# Patient Record
Sex: Male | Born: 1980 | Hispanic: No | Marital: Single | Smoking: Current every day smoker
Health system: Southern US, Community
[De-identification: ages and names within clinical notes are randomized; demographics above are authoritative.]

## PROBLEM LIST (undated history)

## (undated) DIAGNOSIS — F419 Anxiety disorder, unspecified: Secondary | ICD-10-CM

## (undated) DIAGNOSIS — R519 Headache, unspecified: Secondary | ICD-10-CM

## (undated) DIAGNOSIS — Z8659 Personal history of other mental and behavioral disorders: Secondary | ICD-10-CM

## (undated) DIAGNOSIS — R51 Headache: Secondary | ICD-10-CM

## (undated) DIAGNOSIS — F209 Schizophrenia, unspecified: Secondary | ICD-10-CM

## (undated) HISTORY — PX: APPENDECTOMY: SHX54

## (undated) HISTORY — PX: TIBIA FRACTURE SURGERY: SHX806

---

## 1998-09-09 ENCOUNTER — Encounter: Payer: Self-pay | Admitting: Emergency Medicine

## 1998-09-09 ENCOUNTER — Emergency Department (HOSPITAL_COMMUNITY): Admission: EM | Admit: 1998-09-09 | Discharge: 1998-09-09 | Payer: Self-pay | Admitting: Emergency Medicine

## 2001-05-16 ENCOUNTER — Emergency Department (HOSPITAL_COMMUNITY): Admission: EM | Admit: 2001-05-16 | Discharge: 2001-05-16 | Payer: Self-pay | Admitting: Emergency Medicine

## 2002-01-11 ENCOUNTER — Encounter: Admission: RE | Admit: 2002-01-11 | Discharge: 2002-01-11 | Payer: Self-pay | Admitting: *Deleted

## 2002-01-25 ENCOUNTER — Encounter: Admission: RE | Admit: 2002-01-25 | Discharge: 2002-01-25 | Payer: Self-pay | Admitting: *Deleted

## 2002-04-12 ENCOUNTER — Encounter: Admission: RE | Admit: 2002-04-12 | Discharge: 2002-04-12 | Payer: Self-pay | Admitting: *Deleted

## 2002-10-03 ENCOUNTER — Emergency Department (HOSPITAL_COMMUNITY): Admission: EM | Admit: 2002-10-03 | Discharge: 2002-10-03 | Payer: Self-pay | Admitting: *Deleted

## 2002-10-05 ENCOUNTER — Encounter: Payer: Self-pay | Admitting: Family Medicine

## 2002-10-05 ENCOUNTER — Inpatient Hospital Stay (HOSPITAL_COMMUNITY): Admission: EM | Admit: 2002-10-05 | Discharge: 2002-10-07 | Payer: Self-pay | Admitting: Emergency Medicine

## 2002-10-06 ENCOUNTER — Encounter (INDEPENDENT_AMBULATORY_CARE_PROVIDER_SITE_OTHER): Payer: Self-pay | Admitting: Specialist

## 2002-10-12 ENCOUNTER — Ambulatory Visit (HOSPITAL_COMMUNITY): Admission: RE | Admit: 2002-10-12 | Discharge: 2002-10-12 | Payer: Self-pay | Admitting: Gastroenterology

## 2004-07-15 ENCOUNTER — Ambulatory Visit: Payer: Self-pay | Admitting: Internal Medicine

## 2005-07-20 ENCOUNTER — Ambulatory Visit: Payer: Self-pay | Admitting: Family Medicine

## 2005-08-27 ENCOUNTER — Ambulatory Visit (HOSPITAL_BASED_OUTPATIENT_CLINIC_OR_DEPARTMENT_OTHER): Admission: RE | Admit: 2005-08-27 | Discharge: 2005-08-27 | Payer: Self-pay | Admitting: Urology

## 2005-08-27 ENCOUNTER — Ambulatory Visit: Payer: Self-pay | Admitting: *Deleted

## 2005-08-27 ENCOUNTER — Encounter (INDEPENDENT_AMBULATORY_CARE_PROVIDER_SITE_OTHER): Payer: Self-pay | Admitting: Specialist

## 2007-02-10 ENCOUNTER — Emergency Department (HOSPITAL_COMMUNITY): Admission: EM | Admit: 2007-02-10 | Discharge: 2007-02-11 | Payer: Self-pay | Admitting: Emergency Medicine

## 2007-03-16 ENCOUNTER — Ambulatory Visit: Payer: Self-pay | Admitting: Internal Medicine

## 2007-04-04 ENCOUNTER — Ambulatory Visit: Payer: Self-pay | Admitting: Psychiatry

## 2007-04-04 ENCOUNTER — Inpatient Hospital Stay (HOSPITAL_COMMUNITY): Admission: AD | Admit: 2007-04-04 | Discharge: 2007-04-14 | Payer: Self-pay | Admitting: Psychiatry

## 2007-07-05 ENCOUNTER — Inpatient Hospital Stay (HOSPITAL_COMMUNITY): Admission: EM | Admit: 2007-07-05 | Discharge: 2007-07-06 | Payer: Self-pay | Admitting: Emergency Medicine

## 2007-07-06 ENCOUNTER — Ambulatory Visit: Payer: Self-pay | Admitting: Psychiatry

## 2008-08-23 ENCOUNTER — Emergency Department (HOSPITAL_COMMUNITY): Admission: EM | Admit: 2008-08-23 | Discharge: 2008-08-23 | Payer: Self-pay | Admitting: Emergency Medicine

## 2008-09-07 ENCOUNTER — Ambulatory Visit: Payer: Self-pay | Admitting: Internal Medicine

## 2008-11-25 ENCOUNTER — Emergency Department (HOSPITAL_BASED_OUTPATIENT_CLINIC_OR_DEPARTMENT_OTHER): Admission: EM | Admit: 2008-11-25 | Discharge: 2008-11-25 | Payer: Self-pay | Admitting: Emergency Medicine

## 2008-11-25 ENCOUNTER — Ambulatory Visit: Payer: Self-pay | Admitting: Diagnostic Radiology

## 2008-11-27 ENCOUNTER — Other Ambulatory Visit: Payer: Self-pay | Admitting: Emergency Medicine

## 2008-11-27 ENCOUNTER — Inpatient Hospital Stay (HOSPITAL_COMMUNITY): Admission: EM | Admit: 2008-11-27 | Discharge: 2008-11-30 | Payer: Self-pay | Admitting: Emergency Medicine

## 2008-11-27 ENCOUNTER — Ambulatory Visit: Payer: Self-pay | Admitting: Infectious Disease

## 2008-11-27 ENCOUNTER — Encounter (INDEPENDENT_AMBULATORY_CARE_PROVIDER_SITE_OTHER): Payer: Self-pay | Admitting: Surgery

## 2010-05-28 ENCOUNTER — Emergency Department (HOSPITAL_COMMUNITY): Payer: Self-pay

## 2010-05-28 ENCOUNTER — Inpatient Hospital Stay (HOSPITAL_COMMUNITY)
Admission: EM | Admit: 2010-05-28 | Discharge: 2010-06-03 | DRG: 488 | Disposition: A | Discharge status: Home / Self Care | Payer: Self-pay | Attending: General Surgery | Admitting: General Surgery

## 2010-05-28 DIAGNOSIS — S82209A Unspecified fracture of shaft of unspecified tibia, initial encounter for closed fracture: Principal | ICD-10-CM | POA: Diagnosis present

## 2010-05-28 DIAGNOSIS — S82839A Other fracture of upper and lower end of unspecified fibula, initial encounter for closed fracture: Secondary | ICD-10-CM | POA: Diagnosis present

## 2010-05-28 DIAGNOSIS — F17200 Nicotine dependence, unspecified, uncomplicated: Secondary | ICD-10-CM | POA: Diagnosis present

## 2010-05-28 DIAGNOSIS — S838X9A Sprain of other specified parts of unspecified knee, initial encounter: Secondary | ICD-10-CM | POA: Diagnosis present

## 2010-05-28 DIAGNOSIS — Z5900 Homelessness unspecified: Secondary | ICD-10-CM

## 2010-05-28 DIAGNOSIS — F209 Schizophrenia, unspecified: Secondary | ICD-10-CM | POA: Diagnosis present

## 2010-05-28 DIAGNOSIS — Y92410 Unspecified street and highway as the place of occurrence of the external cause: Secondary | ICD-10-CM

## 2010-05-28 DIAGNOSIS — IMO0002 Reserved for concepts with insufficient information to code with codable children: Secondary | ICD-10-CM

## 2010-05-28 DIAGNOSIS — F3289 Other specified depressive episodes: Secondary | ICD-10-CM | POA: Diagnosis present

## 2010-05-28 DIAGNOSIS — F329 Major depressive disorder, single episode, unspecified: Secondary | ICD-10-CM | POA: Diagnosis present

## 2010-05-28 DIAGNOSIS — D62 Acute posthemorrhagic anemia: Secondary | ICD-10-CM | POA: Diagnosis not present

## 2010-05-28 LAB — URINALYSIS, ROUTINE W REFLEX MICROSCOPIC
Ketones, ur: NEGATIVE mg/dL
Nitrite: NEGATIVE
Protein, ur: NEGATIVE mg/dL
Urobilinogen, UA: 0.2 mg/dL (ref 0.0–1.0)

## 2010-05-28 LAB — TYPE AND SCREEN: Unit division: 0

## 2010-05-28 LAB — PROTIME-INR: INR: 0.85 (ref 0.00–1.49)

## 2010-05-28 LAB — COMPREHENSIVE METABOLIC PANEL
Albumin: 4.2 g/dL (ref 3.5–5.2)
Alkaline Phosphatase: 93 U/L (ref 39–117)
BUN: 7 mg/dL (ref 6–23)
Chloride: 99 mEq/L (ref 96–112)
Creatinine, Ser: 0.87 mg/dL (ref 0.4–1.5)
GFR calc non Af Amer: >60 mL/min (ref >60)
Glucose, Bld: 132 mg/dL — ABNORMAL HIGH (ref 70–99)
Potassium: 4.5 mEq/L (ref 3.5–5.1)
Total Bilirubin: 0.7 mg/dL (ref 0.3–1.2)

## 2010-05-28 LAB — LACTIC ACID, PLASMA: Lactic Acid, Venous: 3.9 mmol/L — ABNORMAL HIGH (ref 0.5–2.2)

## 2010-05-28 LAB — POCT I-STAT, CHEM 8
Calcium, Ion: 1.03 mmol/L — ABNORMAL LOW (ref 1.12–1.32)
Creatinine, Ser: 0.8 mg/dL (ref 0.4–1.5)
Glucose, Bld: 118 mg/dL — ABNORMAL HIGH (ref 70–99)
HCT: 55 % — ABNORMAL HIGH (ref 39.0–52.0)
Hemoglobin: 18.7 g/dL — ABNORMAL HIGH (ref 13.0–17.0)

## 2010-05-28 LAB — CBC
HCT: 48.7 % (ref 39.0–52.0)
Hemoglobin: 17.4 g/dL — ABNORMAL HIGH (ref 13.0–17.0)
MCH: 31.6 pg (ref 26.0–34.0)
MCHC: 35.7 g/dL (ref 30.0–36.0)
RDW: 15.8 % — ABNORMAL HIGH (ref 11.5–15.5)

## 2010-05-29 LAB — BASIC METABOLIC PANEL
Calcium: 9 mg/dL (ref 8.4–10.5)
GFR calc Af Amer: >60 mL/min (ref >60)
GFR calc non Af Amer: >60 mL/min (ref >60)
Glucose, Bld: 100 mg/dL — ABNORMAL HIGH (ref 70–99)
Potassium: 4 mEq/L (ref 3.5–5.1)
Sodium: 137 mEq/L (ref 135–145)

## 2010-05-30 ENCOUNTER — Inpatient Hospital Stay (HOSPITAL_COMMUNITY): Payer: Self-pay

## 2010-05-30 LAB — CBC
HCT: 42.7 % (ref 39.0–52.0)
MCHC: 34 g/dL (ref 30.0–36.0)
Platelets: 211 10*3/uL (ref 150–400)
RDW: 15.9 % — ABNORMAL HIGH (ref 11.5–15.5)
WBC: 12.7 10*3/uL — ABNORMAL HIGH (ref 4.0–10.5)

## 2010-05-30 LAB — MRSA PCR SCREENING: MRSA by PCR: NEGATIVE

## 2010-06-01 LAB — BASIC METABOLIC PANEL
Calcium: 8.7 mg/dL (ref 8.4–10.5)
Chloride: 100 mEq/L (ref 96–112)
Creatinine, Ser: 0.77 mg/dL (ref 0.4–1.5)
GFR calc Af Amer: >60 mL/min (ref >60)
Sodium: 134 mEq/L — ABNORMAL LOW (ref 135–145)

## 2010-06-01 LAB — CBC
MCH: 29 pg (ref 26.0–34.0)
Platelets: 263 10*3/uL (ref 150–400)
RBC: 4 MIL/uL — ABNORMAL LOW (ref 4.22–5.81)
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
## 2010-07-03 ENCOUNTER — Ambulatory Visit (INDEPENDENT_AMBULATORY_CARE_PROVIDER_SITE_OTHER): Payer: Self-pay

## 2010-07-03 ENCOUNTER — Inpatient Hospital Stay (INDEPENDENT_AMBULATORY_CARE_PROVIDER_SITE_OTHER)
Admission: RE | Admit: 2010-07-03 | Discharge: 2010-07-03 | Disposition: A | Discharge status: Home / Self Care | Payer: Self-pay | Source: Ambulatory Visit | Attending: Emergency Medicine | Admitting: Emergency Medicine

## 2010-07-03 DIAGNOSIS — S82209A Unspecified fracture of shaft of unspecified tibia, initial encounter for closed fracture: Secondary | ICD-10-CM

## 2010-07-12 LAB — C-REACTIVE PROTEIN: CRP: 8.2 mg/dL — ABNORMAL HIGH (ref <0.6)

## 2010-07-12 LAB — CBC
HCT: 31.7 % — ABNORMAL LOW (ref 39.0–52.0)
HCT: 33 % — ABNORMAL LOW (ref 39.0–52.0)
HCT: 34.6 % — ABNORMAL LOW (ref 39.0–52.0)
HCT: 38.3 % — ABNORMAL LOW (ref 39.0–52.0)
Hemoglobin: 11.5 g/dL — ABNORMAL LOW (ref 13.0–17.0)
Hemoglobin: 13.1 g/dL (ref 13.0–17.0)
MCHC: 33.4 g/dL (ref 30.0–36.0)
MCHC: 34.3 g/dL (ref 30.0–36.0)
MCV: 86.2 fL (ref 78.0–100.0)
MCV: 86.2 fL (ref 78.0–100.0)
MCV: 86.3 fL (ref 78.0–100.0)
Platelets: 280 10*3/uL (ref 150–400)
Platelets: 291 10*3/uL (ref 150–400)
Platelets: 328 10*3/uL (ref 150–400)
RBC: 3.62 MIL/uL — ABNORMAL LOW (ref 4.22–5.81)
RBC: 4.01 MIL/uL — ABNORMAL LOW (ref 4.22–5.81)
RBC: 4.44 MIL/uL (ref 4.22–5.81)
RBC: 4.73 MIL/uL (ref 4.22–5.81)
RDW: 18.9 % — ABNORMAL HIGH (ref 11.5–15.5)
RDW: 18.5 % — ABNORMAL HIGH (ref 11.5–15.5)
WBC: 12.6 10*3/uL — ABNORMAL HIGH (ref 4.0–10.5)
WBC: 13.5 10*3/uL — ABNORMAL HIGH (ref 4.0–10.5)
WBC: 15.1 10*3/uL — ABNORMAL HIGH (ref 4.0–10.5)
WBC: 17.5 10*3/uL — ABNORMAL HIGH (ref 4.0–10.5)

## 2010-07-12 LAB — COMPREHENSIVE METABOLIC PANEL WITH GFR
ALT: 48 U/L (ref 0–53)
AST: 37 U/L (ref 0–37)
Albumin: 4.3 g/dL (ref 3.5–5.2)
Alkaline Phosphatase: 132 U/L — ABNORMAL HIGH (ref 39–117)
BUN: 4 mg/dL — ABNORMAL LOW (ref 6–23)
CO2: 30 meq/L (ref 19–32)
Calcium: 9.4 mg/dL (ref 8.4–10.5)
Chloride: 97 meq/L (ref 96–112)
Creatinine, Ser: 0.7 mg/dL (ref 0.4–1.5)
GFR calc non Af Amer: >60 mL/min
Glucose, Bld: 98 mg/dL (ref 70–99)
Potassium: 3.8 meq/L (ref 3.5–5.1)
Sodium: 134 meq/L — ABNORMAL LOW (ref 135–145)
Total Bilirubin: 0.3 mg/dL (ref 0.3–1.2)
Total Protein: 7.6 g/dL (ref 6.0–8.3)

## 2010-07-12 LAB — CULTURE, BLOOD (ROUTINE X 2)

## 2010-07-12 LAB — URINALYSIS, MICROSCOPIC ONLY
Bilirubin Urine: NEGATIVE
Ketones, ur: >80 mg/dL — AB
Nitrite: NEGATIVE
Protein, ur: NEGATIVE mg/dL
Specific Gravity, Urine: 1.008 (ref 1.005–1.030)
Urobilinogen, UA: 0.2 mg/dL (ref 0.0–1.0)

## 2010-07-12 LAB — POCT TOXICOLOGY PANEL: Benzodiazepines: POSITIVE

## 2010-07-12 LAB — DIFFERENTIAL
Basophils Absolute: 0.1 10*3/uL (ref 0.0–0.1)
Basophils Relative: 1 % (ref 0–1)
Basophils Relative: 2 % — ABNORMAL HIGH (ref 0–1)
Eosinophils Absolute: 0 10*3/uL (ref 0.0–0.7)
Eosinophils Absolute: 0.1 10*3/uL (ref 0.0–0.7)
Eosinophils Relative: 0 % (ref 0–5)
Eosinophils Relative: 1 % (ref 0–5)
Lymphocytes Relative: 19 % (ref 12–46)
Lymphocytes Relative: 24 % (ref 12–46)
Lymphs Abs: 2.3 10*3/uL (ref 0.7–4.0)
Lymphs Abs: 4.2 10*3/uL — ABNORMAL HIGH (ref 0.7–4.0)
Monocytes Absolute: 1.3 10*3/uL — ABNORMAL HIGH (ref 0.1–1.0)
Monocytes Absolute: 1.6 10*3/uL — ABNORMAL HIGH (ref 0.1–1.0)
Monocytes Relative: 12 % (ref 3–12)
Monocytes Relative: 11 % (ref 3–12)
Monocytes Relative: 8 % (ref 3–12)
Neutro Abs: 8.7 10*3/uL — ABNORMAL HIGH (ref 1.7–7.7)
Neutro Abs: 11.8 10*3/uL — ABNORMAL HIGH (ref 1.7–7.7)
Neutrophils Relative %: 69 % (ref 43–77)
Neutrophils Relative %: 67 % (ref 43–77)

## 2010-07-12 LAB — URINALYSIS, ROUTINE W REFLEX MICROSCOPIC
Bilirubin Urine: NEGATIVE
Bilirubin Urine: NEGATIVE
Glucose, UA: NEGATIVE mg/dL
Hgb urine dipstick: NEGATIVE
Ketones, ur: NEGATIVE mg/dL
Nitrite: NEGATIVE
Protein, ur: NEGATIVE mg/dL
Specific Gravity, Urine: 1.002 — ABNORMAL LOW (ref 1.005–1.030)
Specific Gravity, Urine: 1.005 (ref 1.005–1.030)
Urobilinogen, UA: 0.2 mg/dL (ref 0.0–1.0)
pH: 6 (ref 5.0–8.0)
pH: 7.5 (ref 5.0–8.0)

## 2010-07-12 LAB — CARBAMAZEPINE LEVEL, TOTAL: Carbamazepine Lvl: 12.2 ug/mL — ABNORMAL HIGH (ref 4.0–12.0)

## 2010-07-12 LAB — BASIC METABOLIC PANEL
BUN: <1 mg/dL — ABNORMAL LOW (ref 6–23)
CO2: 27 mEq/L (ref 19–32)
Calcium: 8.4 mg/dL (ref 8.4–10.5)
Chloride: 100 mEq/L (ref 96–112)
Creatinine, Ser: 0.62 mg/dL (ref 0.4–1.5)
Creatinine, Ser: 0.66 mg/dL (ref 0.4–1.5)
GFR calc Af Amer: >60 mL/min (ref >60)
GFR calc non Af Amer: >60 mL/min (ref >60)
Glucose, Bld: 83 mg/dL (ref 70–99)
Potassium: 3.6 mEq/L (ref 3.5–5.1)
Sodium: 139 mEq/L (ref 135–145)

## 2010-07-12 LAB — URINE CULTURE: Colony Count: NO GROWTH

## 2010-07-12 LAB — RAPID URINE DRUG SCREEN, HOSP PERFORMED
Amphetamines: NOT DETECTED
Cocaine: NOT DETECTED
Opiates: POSITIVE — AB
Tetrahydrocannabinol: NOT DETECTED

## 2010-07-12 LAB — CROSSMATCH: Antibody Screen: NEGATIVE

## 2010-07-12 LAB — HIV-1 RNA QUANT-NO REFLEX-BLD
HIV 1 RNA Quant: <48 copies/mL (ref <48)
HIV-1 RNA Quant, Log: <1.68 {Log} (ref <1.68)

## 2010-07-12 LAB — HEPATITIS PANEL, ACUTE
HCV Ab: NEGATIVE
Hepatitis B Surface Ag: NEGATIVE

## 2010-07-12 LAB — ETHANOL

## 2010-07-12 LAB — HEMOGLOBIN AND HEMATOCRIT, BLOOD: Hemoglobin: 11.9 g/dL — ABNORMAL LOW (ref 13.0–17.0)

## 2010-07-12 LAB — PROTIME-INR
INR: 1 (ref 0.00–1.49)
Prothrombin Time: 13.5 s (ref 11.6–15.2)

## 2010-07-12 LAB — RPR: RPR Ser Ql: NONREACTIVE

## 2010-07-12 LAB — APTT: aPTT: 33 s (ref 24–37)

## 2010-08-19 NOTE — Op Note (Signed)
NAMEHUSAM, [name]                 ACCOUNT NO.:  [id number]   MEDICAL RECORD NO.:  [id number]          PATIENT TYPE:  INP   LOCATION:  5153                         FACILITY:  [facility]   PHYSICIAN:  Wilmon Arms. Corliss Skains, M.D. DATE OF BIRTH:  [birth year]   DATE OF PROCEDURE:  11/27/2008  DATE OF DISCHARGE:                               OPERATIVE REPORT   PREOPERATIVE DIAGNOSES:  1. Thrombosed external hemorrhoids.  2. Grade 4 prolapsed internal hemorrhoids.   POSTOPERATIVE DIAGNOSES:  1. Thrombosed external hemorrhoids.  2. Grade 4 prolapsed internal hemorrhoids and a 5-mm penile condyloma.   PROCEDURE:  Three-column internal and external hemorrhoidectomy and  excision of penile condyloma.   SURGEON:  Wilmon Arms. Tsuei, MD   ANESTHESIA:  General.   INDICATIONS:  This is a 30 year old male, who suffers from schizophrenia  having an acute schizophrenic episode, who presents with a 3-day history  of rectal bleeding.  The patient has had a history of hemorrhoid  disease.  He has had severe rectal pain.  On examination, he has large,  swollen external hemorrhoids with some apparent prolapse of the internal  hemorrhoids.  However, the patient is far too tender for a thorough  examination.  He is brought to the operating room for excision of these  hemorrhoids and examination under anesthesia.   DESCRIPTION OF PROCEDURE:  The patient was brought to the operating room  and placed in supine position on operating table.  After an adequate  level of general anesthesia was obtained, the patient's legs were placed  in lithotomy position in [place] stirrups.  We examined his perineum.  The patient has a small condyloma on the right side of the shaft of his  penis.  No other condyloma or lesions were noted.  The patient has a  very large, swollen external hemorrhoids.  On digital rectal  examination, it seems that these are contiguous with some prolapsed  internal hemorrhoids.  There is no way of  reducing these hemorrhoids.  These are very friable and bleed easily just with digital palpation.  We  injected all around these areas with 0.5% Marcaine.  There are no other  rectal masses on digital rectal examination.  I then used the harmonic  scalpel to excise all the hemorrhoid tissue circumferentially.  This  included the internal hemorrhoids.  We dissected these off the sphincter  muscles circumferentially.  This hemorrhoidectomy came off as a single  specimen.  We then controlled the bleeding with cautery.  Circumferential examination showed the hemorrhoid disease had been  completely excised.  Sphincter muscle seemed to be intact.  The mucosa  was detached circumferentially and retracted up into the lower rectum.  I then used 2 separate 3-0 Vicryl sutures to pull the mucosa down to the  edge of the anoderm.  I ran this up along the right side.  I ran a  second stitch down on the left side.  I left gaps in between the sutures  to allow any bleeding to the drain.  The anal canal, lower rectum were  then packed with Gelfoam.  Hemostasis  was good.  An ABD dressing was  applied.  His penile shaft was then prepped with Betadine.  I  anesthetized the area around the condyloma with 0.5% Marcaine.  We  excised this area entirely and sent for pathologic examination.  The  base of the excision site was then thoroughly cauterized.  A dry  dressing was applied here.  The patient was then extubated and brought  to recovery in stable condition.  All sponge, instrument, and needle  counts were correct.      Wilmon Arms. Tsuei, M.D.  Electronically Signed     MKT/MEDQ  D:  11/27/2008  T:  11/28/2008  Job:  [id number]

## 2010-08-19 NOTE — Consult Note (Signed)
[name], [name]                 ACCOUNT NO.:  [id number]   MEDICAL RECORD NO.:  [id number]          PATIENT TYPE:  EMS   LOCATION:  MAJO                         FACILITY:  [facility]   PHYSICIAN:  Gabrielle Dare. Janee Morn, M.D.DATE OF BIRTH:  1980-08-24   DATE OF CONSULTATION:  11/27/2008  DATE OF DISCHARGE:                                 CONSULTATION   TIME OF CONSULTATION:  0800 hours.   REQUESTING PHYSICIAN:  Dr. Karma Ganja in the emergency department.   PRIMARY CARE PHYSICIAN:  None.   REASON FOR CONSULTATION:  Rectal bleeding.   HISTORY OF PRESENT ILLNESS:  [name] is a newly turned 30 year old  male who has a history of depression, schizophrenia, and hemorrhoids,  who is currently having an acute schizophrenic episode.  He has been at  [facility] for the past several days due to his schizophrenia and  due to rectal bleeding that began 3 days ago, was taken to the [facility], where he was later transferred here to [facility].  The patient states that he has been bleeding for 3 days and that  he does have a history of hemorrhoids for which, he has been treated for  in the past but does not remember the name of the physician or what type  of treatment he received for these.  The patient otherwise complains of  subjective weakness and rectal pain.  However, denies any fevers.  He  states that he does not have constipation and has approximately a bowel  movement a day.  He does state that his last bowel movement was  approximately 2 days ago, and it was hard and did have blood in it.  Because of these things, the patient was transferred here, and we were  asked to see the patient for evaluation.   REVIEW OF SYSTEMS:  Please see HPI.  Otherwise, the patient denies all  other systems.   PAST MEDICAL HISTORY:  1. History of depression.  2. Schizophrenia.  3. History of prior hemorrhoids.   PAST SURGICAL HISTORY:  1. Appendectomy.  2. Removal of cyst from  his jaw.   SOCIAL HISTORY:  The patient apparently lives with his mother and his  brother at home.  However, recently was taken to [facility]  due to an acute schizophrenic episode.  He admits to smoking  approximately a pack of cigarettes a day, and drinks 2-3 bottles of beer  a day.  Per the medical chart, it does show that he has a history of  cocaine abuse.   ALLERGIES:  NKDA.   MEDICATIONS:  Doses are all unknown, but he does take Tegretol and  haloperidol.   PHYSICAL EXAMINATION:  GENERAL:  [name] is a 30 year old male, who is  somewhat agitated on exam and in mild distress due to rectal pain.  Of  note, the patient does have frequent outbursts of agitation and anger  depending on certain things that set him off.  VITAL SIGNS:  Temperature 97.4, pulse 73, respirations 13, blood  pressure 115/68.  HEENT:  Head is normocephalic,  atraumatic.  Sclerae noninjected.  Pupils  are equal, round, and reactive to light.  Ears and nose without any  obvious masses or lesions.  No rhinorrhea.  Mouth is pink and moist.  Throat shows no exudate.  NECK:  Supple.  Trachea is midline.  No thyromegaly.  HEART:  Regular rate and rhythm.  Normal S1 and S2.  No murmurs,  gallops, or rubs are noted.  +2 carotid, radial, and pedal pulses  bilaterally.  LUNGS:  Clear to auscultation bilaterally with no wheezes, rhonchi, or  rales noted.  Respiratory effort is nonlabored.  ABDOMEN:  Soft, nontender, nondistended with active bowel sounds.  He  does have a right lower quadrant oblique incision from his prior  appendectomy that was done in [place] many years ago.  RECTAL:  External anal exam reveals a large external hemorrhoid along  the right side with significant edema.  There is also some external  hemorrhoids along the left side with bloody oozing present, but no  significant induration is noted.  Rectal exam is not tolerated at this  time by the patient.  MUSCULOSKELETAL:  All 4  extremities are symmetrical with no cyanosis,  clubbing, or edema.  NEUROLOGIC:  Cranial nerves II through XII appear to be grossly intact.  PSYCHIATRIC:  Currently, the patient is in an acute schizophrenic  episode and at times is agitated and at times is calm.   LABS AND DIAGNOSTICS:  White blood cell count 15,100, hemoglobin 13.1,  hematocrit 38.3, and platelets 328,000.  Sodium 139, potassium 4.6,  glucose 83, BUN 12, creatinine 0.8.  Repeat labs are currently pending  as these were done around 12 o'clock last night.  INR is 1.0.  Acute  abdominal series is unremarkable with a normal bowel gas pattern.   IMPRESSION:  1. Bleeding external hemorrhoids.  2. Acute schizophrenia.  3. History of depression.   PLAN:  At this time, Dr. Janee Morn and I have evaluated the patient  together.  It is felt due to the patient's acute schizophrenia and  potential complications from this, we would ask for a medicine  admission.  Otherwise currently, the patient is refusing admission due  to the fact that if he has surgery, he will need to remain n.p.o.  However, the patient is actively bleeding with fairly severe hemorrhoids  and would benefit from an exam under anesthesia and hemorrhoidectomy.  Dr. Janee Morn has discussed this with the patient's family, which  includes his brother and his  mother, who are in agreement that the patient would benefit from staying  in the hospital and having this procedure done.  At this time, we will  otherwise proceed with surgical intervention this afternoon unless  further complications arise.      Letha Cape, PA      Gabrielle Dare. Janee Morn, M.D.  Electronically Signed    KEO/MEDQ  D:  11/27/2008  T:  11/27/2008  Job:  [id number]

## 2010-08-19 NOTE — H&P (Signed)
[name], [name]                 ACCOUNT NO.:  [id number]   MEDICAL RECORD NO.:  [id number]          PATIENT TYPE:  IPS   LOCATION:  0305                          FACILITY:  [facility]   PHYSICIAN:  Jasmine Pang, M.D. DATE OF BIRTH:  [birth year]   DATE OF ADMISSION:  04/04/2007  DATE OF DISCHARGE:                       PSYCHIATRIC ADMISSION ASSESSMENT   TIME OF ASSESSMENT:  April 05, 2007 at 8:50 a.m.   IDENTIFICATION:  This is a 30 year old [place] male, single.  This is  an involuntary admission.   HISTORY OF PRESENT ILLNESS:  This patient was referred by [facility] mental health where he presented after his brother petitioned on  him said that the patient has been threatening to kill myself and making  threats about hurting his brothers. He also made statements that he had  resumed using alcohol and drugs and that he sees demons coming to harm  him. Also concerns that he had lost weight recently and is not sleeping  well and appearing to be paranoid.  The patient himself categorically  denies this.  Says that he has had some chronic conflict with his  brothers over the course of the past couple months that involved  assuming responsibilities for his brother's car insurance since his  brother having incurred a DWI and was unable to get insurance on his  own.  He is denying any suicidal or homicidal thought.  Says that he has  been using some alcohol on the weekends, a small amount,  maybe a beer  to in order to get to sleep at night.   PAST PSYCHIATRIC HISTORY:  This is first inpatient psychiatric  admission.  The patient self reports that he has been seeing Dr.  Marlyn Corporal a psychiatrist here in town in the past for depression after  several months of unemployment. This course of treatment occurred over  the last year at some point, he did take some type of medication, but  does not remember the name of it. He denies any current substance abuse.  Denies history of  suicide attempts, but denies he has been under  treatment now. 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 He  is  happy,  making enough money to recently move out from his home with his  brothers and get his own apartment.   SOCIAL HISTORY:  This is a single patient never married.  No children.  Originally from the [place].  Currently,  he had been living in an  apartment with his older brother and his younger brother until the day  after Christmas when he moved out on his own. Denies any legal problems.  Stable employment.  Denying any substance abuse and no children.  Never  married.   FAMILY HISTORY:  Family history is negative.  He reports negative for  mental illness or substance abuse.   PAST MEDICAL HISTORY:  No regular primary care treatment.   MEDICAL PROBLEMS:  Current herpes labialis. Past medical history is  remarkable  for gastritis with a history of a critical low hemoglobin  with transfusion in 2004 and a history of possible history of  appendectomy, some surgery that sounds like an appendectomy and a  history of excision of a scrotal cyst in 2007.   CURRENT MEDICATIONS:  Ranitidine 150 mg p.o. b.i.d. p.r.n. for  gastritis.   DRUG ALLERGIES:  None.   POSITIVE PHYSICAL FINDINGS:  Full physical exam is noted in the record.  He is denying any physical problems and he appears to be a healthy white  male with nonfocal neuro exam. The  patient does have what appears to be  a new outbreak of some clear vesicles along the edges of his right lower  lip.  He is requesting some medication for cold sores   DIAGNOSTIC STUDIES:  CBC:  WBC 14.5, hemoglobin 10.4, hematocrit 34.1,  platelets 451,000 and MCV is 60.  Chemistry:  Sodium  141, potassium 3.4, chloride 105, carbon dioxide 27,  BUN 2, creatinine 0.77 and random glucose 123.  Liver enzymes SGOT 24,  SGPT 17, alkaline phosphatase 91 and total  bilirubin 0.6.  Urinalysis was unremarkable.  TSH and urine drug screen currently pending.   MENTAL STATUS EXAM:  Fully alert male appropriately groomed, coherent,  rather perplexed about why he is here, but otherwise well spoken and  polite.  Speech is normal in pace, tone and production and form non-  pressured.  Mood is neutral.  Thought process reveals no evidence of  psychosis.  No overtly delusional statements made. He is denying any  suicidal or harmful thoughts.  Cognition is preserved.  He is oriented  x4.  Immediate recent remote memory are intact.  AXIS I:  Rule out depressive disorder NOS.  AXIS II:  Deferred.  AXIS III:  Herpes labialis and microcytic anemia.  AXIS IV:  Chronic relationship conflict with the brother according to  his history.  AXIS V:  Current 59 past year 78 estimated.   PLAN:  The  plan is to involuntarily admit the patient with q. 15-minute  checks in place to evaluate his mood and evaluate him for suicidology.  We have asked to have him give permission so we could speak with his  family and hear what their concerns are. He has declined at this point  to allow [place] to discuss anything with his family members.  Our counselors  are going to interview him today and we have tried to give him some  encouragement, let him settle in and see what additional corroboration  we can get. We are going to give him some Denavir cream for his cold  sores and he has p.r.n. medication ordered for sleep and his ranitidine  150 mg b.i.d. as needed.   ESTIMATED LENGTH OF STAY:  Estimated length of stay is 2-3 days.      Margaret A. Lorin Picket, N.P.      Jasmine Pang, M.D.  Electronically Signed    MAS/MEDQ  D:  04/05/2007  T:  04/06/2007  Job:  [id number]

## 2010-08-19 NOTE — H&P (Signed)
[name], [name]                 ACCOUNT NO.:  [id number]   MEDICAL RECORD NO.:  [id number]          PATIENT TYPE:  INP   LOCATION:  1503                         FACILITY:  [facility]   PHYSICIAN:  Isidor Holts, M.D.  DATE OF BIRTH:  July 16, 1980   DATE OF ADMISSION:  07/04/2007  DATE OF DISCHARGE:                              HISTORY & PHYSICAL   PRIMARY CARE PHYSICIAN:  Unassigned.   CHIEF COMPLAINT:  Ingestion of 20 pills of Advil P.M. around 2 p.m. on  July 04, 2007.  Vomited about three times, denies abdominal pain.   HISTORY OF PRESENT ILLNESS:  This is a 30 year old male.  For past  medical history, see below.  According to the patient, history is as  outlined above.  However, patient is quite reticent about his reasons  for overdosing.  He states I wanted to and would not volunteer any  more information.   PAST MEDICAL HISTORY:  1. Status post involuntary admission to [facility]      April 04, 2007 for suicidal ideation.  2. Status post excision left scrotal mass/cyst September 27, 2005.  3. Chronic gastritis July 2004, complicated by GI bleed and anemia,      required transfusion 3 units PRBC.  EGD at that time, showed      gastritis.  Colonoscopy, including random biopsies, was negative.  4. Status post appendectomy in childhood.  5. Smoking history.   MEDICATIONS:  Zantac 150 mg p.o. b.i.d.   ALLERGIES:  NO KNOWN DRUG ALLERGIES.   REVIEW OF SYSTEMS:  As per HPI and chief complaint.  Denies abdominal  pain.  Vomited about three times.  Denies diarrhea.  Denies shortness of  breath or chest pain.   SOCIAL HISTORY:  The patient is single, employed, although he would not  state exactly where.  Has no offspring.  Smoker, about one pack of  cigarettes per day.  Drinks alcohol only occasionally.  Denies drug  abuse.  Also denies cocaine or marijuana use.   FAMILY HISTORY:  Mother is alive and well.  Father is deceased from  emphysema.   PHYSICAL  EXAMINATION:  VITALS:  Temperature 98.7, pulse 70 per minute  and regular. Respiratory rate 17, BP 116/66 mmHg, pulse oximeter 97% on  2 liters of oxygen.  GENERAL:  The patient does not appear to be in obvious acute distress at  time of this evaluation.  Alert, communicative, not short of breath at  rest.  HEENT: No clinical pallor or jaundice.  No conjunctival injection.  Throat is clear.  NECK:  Supple.  JVP not seen.  No palpable lymphadenopathy.  No palpable  goiter.  CHEST:  Clinically clear to auscultation.  No wheezes or crackles.  HEART:  Sounds 1 and 2 are heard, normal regular, no murmurs.  ABDOMEN:  Full, soft and nontender.  No palpable organomegaly or  palpable masses.  Normal bowel sounds.  Old appendectomy scar is noted  right lower quadrant.  EXTREMITIES:  Lower extremity examination no pitting edema.  Palpable  peripheral pulses.  MUSCULOSKELETAL:  Unremarkable.  CENTRAL NERVOUS SYSTEM:  No focal neurologic deficit on gross  examination.   INVESTIGATIONS:  CBC shows WBC 17.7, neutrophils 83%, hemoglobin 10.4,  hematocrit 33, MCV 61.6, platelets 385,000.  Electrolytes show sodium  136, potassium 3.3, chloride 101, CO2 26, BUN 11, creatinine 0.91,  glucose 208.  Urine drug screen positive for cocaine and  tetrahydrocannabinol .  Urinalysis is negative.  Alcohol level less than  5.  Salicylate level less than 4.  Acetaminophen level less than 10.  Head CT scan dated June 25, 2007, no acute intracranial findings.  Chest x-ray dated July 04, 2007.  This shows airways thickening  consistent with bronchitis or reactive airways disease.  A 12-lead EKG  dated July 04, 2007 shows sinus rhythm, regular, 75 per minute, normal  axis, no acute ischemic findings.   ASSESSMENT AND PLAN:  1. Advil P.M. overdose; i.e. combination of NSAID and antihistaminic .      We shall admit for telemetric monitoring to rule out arrhythmias;      otherwise observe.  Administer intravenous  fluids, proton pump      inhibitor, antiemetics.   1. Query depression/suicidal attempt.  The patient will certainly      benefit from psychiatric consultation, when medically cleared.      Meanwhile we shall provide one-on-one sitter.   1. Microcytic anemia.  The patient has been previously evaluated in      2004 with endoscopy and random biopsies with no evidence of celiac      disease.  It is possible that this is secondary to thalassemia      trait.   1. Substance abuse.  Patient denies utilization of either marijuana or      cocaine, but these substances were present in his urine drug      screen. He shall be counseled appropriately.   Further management will depend on clinical course.      Isidor Holts, M.D.  Electronically Signed     CO/MEDQ  D:  07/05/2007  T:  07/05/2007  Job:  [id number]

## 2010-08-19 NOTE — Consult Note (Signed)
NAMECHAYSON, [name]                 ACCOUNT NO.:  [id number]   MEDICAL RECORD NO.:  [id number]          PATIENT TYPE:  INP   LOCATION:  1503                         FACILITY:  [facility]   PHYSICIAN:  Anselm Jungling, MD  DATE OF BIRTH:  [birth year]   DATE OF CONSULTATION:  07/06/2007  DATE OF DISCHARGE:                                 CONSULTATION   IDENTIFYING DATA AND REASON FOR REFERRAL:  The patient is a 30 year old  [place] male who was admitted following an overdose.  Psychiatric  consultation is requested to assess mental status and make  recommendations.   HISTORY OF THE PRESENTING PROBLEMS:  The patient was in our inpatient  psychiatric facility in December and January of 2009.  He had been  referred because of mental status changes.  He was uncooperative with  treatment, and basically refused all medication and follow-up.  He was  referred to [facility], but apparently did not follow-  up.   Today, the patient tells me that he took too much medication for my  back.  He states that he has a painful back injury that occurred from  working out in a gym some years ago.  He states that the pain was so bad  that he took more medication that he should have.  He completely denies  that this was any form of self-injury or suicide attempt.  He indicates  that he has an appointment with a doctor to have his back looked at  soon.  He denies any current mental health or psychiatric needs.   When confronted about the fact that he had a urine drug screen was  positive for cocaine and marijuana, he denied having any drug problem.   MENTAL STATUS AND OBSERVATIONS:  The patient is a well-nourished,  normally-developed, adult male.  His command of English as excellent.  He is fully oriented.  His thoughts and speech are normally organized.  There is nothing to suggest any underlying thought disorder, psychosis,  cognitive or memory impairment.  He is generally pleasant and  polite.  He does not appear depressed.  He denies suicidal ideation and denies  that this was a suicide attempt, as described above.   The patient tells me that he lives with his brother and mother.  He is  looking for work but is not working currently.  He politely declines any  offer of any referral to any mental health outpatient services.   IMPRESSION:  The patient is a 30 year old male who had an inpatient  admission about 3 months ago, and now this hospital admission for an  overdose.  On both of these occasions, the patient minimizes any  difficulty and denies any desire or need for any form of mental health  treatment.  I suspect there is some underlying substance abuse, but the  patient will not admit to that.   DIAGNOSTIC IMPRESSION:  AXIS I:  Polysubstance abuse, not otherwise  specified, and rule out mood disorder, not otherwise specified.  AXIS II:  Deferred.  AXIS III:  Status post overdose.  AXIS  IV:  Stressors severe.  AXIS V:  GAF 55.   RECOMMENDATIONS:  At this time, I feel it is reasonable to discontinue  his one-to-one sitter, and when medically cleared to release him to his  family.  As mentioned above, he is refusing any offer of referral for  any mental health services, stating that he does not need this, nor does  he need any chemical dependency treatment services.      Anselm Jungling, MD  Electronically Signed     SPB/MEDQ  D:  07/06/2007  T:  07/06/2007  Job:  [phone]

## 2010-08-19 NOTE — Consult Note (Signed)
[name]                 ACCOUNT NO.:  [id number]   MEDICAL RECORD NO.:  [id number]          PATIENT TYPE:  INP   LOCATION:  5153                         FACILITY:  [facility]   PHYSICIAN:  [name], M.D.  DATE OF BIRTH:  February 08, 1981   DATE OF CONSULTATION:  11/29/2008  DATE OF DISCHARGE:  11/30/2008                                 CONSULTATION   REQUESTING PHYSICIAN:  Clinton Lav, MD   REASON FOR CONSULTATION:  Evaluating regarding the patient's need for  psychiatric care and whether the patient requires inpatient versus  outpatient care.   HISTORY OF PRESENT ILLNESS:  [name] is a 30 year old male  admitted on November 27, 2008 to the [facility] for rectal bleeding.   He had been at the [facility] recently.  He does have  a history of psychosis as well as a history of substance abuse.  He does  state that his mood was acutely depressed as his pain increased,  however, after surgery his pain has greatly improved and his mood is now  normal.   He describes constructive future goals and interests.  He does not have  any thoughts of harming himself or others.  He has no hallucinations or  delusions.  His orientation and memory function are intact.  He is  socially appropriate and cooperative.   PAST PSYCHIATRIC HISTORY:  He was admitted to the [facility] in December with psychosis.  He also has a history of an overdose  in April 2009.   Just prior to this admission, he was experiencing hallucinations and was  at the [facility].  He was on Tegretol and Haldol 5 mg  b.i.d. which has been correlated with remission of his psychiatric  symptoms.   SOCIAL HISTORY:  He has received temporary employment and just received  this news.  He is looking forward to starting that employment when he  leaves the hospital.   PAST MEDICAL HISTORY:  As above.  He is status post surgery for  hemorrhoids.   MENTAL  STATUS EXAMINATION:  [name] is alert.  His eye contact is  good.  His attention span is normal.  He is oriented to all spheres.  He  does not have any thoughts of harming himself or others.  He has no  delusions or hallucinations.  His thought process is coherent.  His  insight is intact.  His judgment is intact.  His mood and affect are  normal.   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  He does have a history of polysubstance abuse  including cocaine.  AXIS II:  Deferred.  AXIS III:  See past medical history.  AXIS IV:  General medical.  AXIS V:  55.   [name] is not at risk to harm himself or others.  He agrees to call  emergency services immediately for any thoughts of harming himself,  thoughts of harming others, or return of hallucinations.   We would ask the social worker to  set up outpatient psychiatric followup  during his first week of discharge.  We would continue on his Haldol 5  mg b.i.d.   [name] declines chemical dependence inpatient residential  rehabilitation because he has this new job which he has waited for  months to obtain.  He wants to proceed with outpatient chemical  dependence rehabilitation through the [facility].  He  also will proceed with the 12-step method and groups.  This was changed.      [name], M.D.  Electronically Signed     JW/MEDQ  D:  12/09/2008  T:  12/10/2008  Job:  [id number]

## 2010-08-19 NOTE — Discharge Summary (Signed)
[name], [name]                 ACCOUNT NO.:  [id number]   MEDICAL RECORD NO.:  [id number]          PATIENT TYPE:  INP   LOCATION:  1503                         FACILITY:  [facility]   PHYSICIAN:  Ladell Pier, M.D.   DATE OF BIRTH:  19-Nov-1980   DATE OF ADMISSION:  07/04/2007  DATE OF DISCHARGE:  07/06/2007                               DISCHARGE SUMMARY   The patient left AMA prior to being seen.  He was cleared by psych.   DISCHARGE DIAGNOSES:  1. Suicide attempt.  Cleared by Dr. Electa Sniff, psychiatry, for      discharge.  2. Chronic gastritis.  3. Status post appendectomy.  4. Smoking history.  5. Question of depression.  6. Substance abuse.  7. Status post excision, left scrotal mass cyst, in June 2007.   DISCHARGE MEDICATIONS:  None.   FOLLOWUP APPOINTMENTS:  The patient refused followup.   PROCEDURES:  None.   CONSULTANTS:  Psych, Dr. Electa Sniff.   HISTORY OF PRESENT ILLNESS:  The patient is a 30 year old male who took  an overdose of Advil and was admitted for psych eval and also for  medical treatment.  The patient took 20 pills of Advil P.M. around 2:00  p.m. on July 04, 2007.  Vomited 3 times.   Past medical history, family history, social history, meds, allergies,  and  review of systems per admission H&P.   PHYSICAL EXAMINATION ON DISCHARGE:  VITAL SIGNS:  Temperature 98.1,  pulse of 70, respirations 20, blood pressure 110/75, pulse ox 100% on  room air.   Physical exam not done secondary to patient leaving AMA.   HOSPITAL COURSE:  Drug overdose:  The patient was admitted to the psych  unit.  He was started on PPI, and he had blood work done.  His kidney  function was normal.  His hemoglobin was 10.5, which is stable, from the  30th to the 31st is about the same.  He was evaluated by psychiatry and  was cleared for discharge.  The patient left prior to my seeing him.   DISCHARGE LABORATORIES:  Hematocrit 34.1.  BNP 89.  WBC 12.6, hemoglobin  10.5,  hematocrit 33.7, platelets 348.  Urinalysis negative.  UDS  positive for cocaine,  positive for tetrahydrocannabinol.  Salicylate  less than 4, alcohol less than 5, Tylenol less than 10, marijuana 61.  TSH 1.981.      Ladell Pier, M.D.  Electronically Signed     NJ/MEDQ  D:  07/06/2007  T:  07/06/2007  Job:  [id number]

## 2010-08-22 NOTE — Op Note (Signed)
   NAME:  [name], [name]                           ACCOUNT NO.:  [id number]   MEDICAL RECORD NO.:  [id number]                   PATIENT TYPE:  AMB   LOCATION:  ENDO                                 FACILITY:  [facility]   PHYSICIAN:  James L. Malon Kindle., M.D.          DATE OF BIRTH:  05-23-1980   DATE OF PROCEDURE:  10/12/2002  DATE OF DISCHARGE:                                 OPERATIVE REPORT   PROCEDURE PERFORMED:  Colonoscopy.   ENDOSCOPIST:  Llana Aliment. Edwards, M.D.   MEDICATIONS:  Fentanyl 150 mcg, Versed 10 mg IV.   INSTRUMENT USED:  Pediatric Olympus video colonoscope.   INDICATIONS FOR PROCEDURE:  The patient was admitted to the hospital with a  hemoglobin of 4.9, had an upper endo that was negative.  Stool was trace  positive.  This was done to rule out a lower GI source of bleeding.   DESCRIPTION OF PROCEDURE:  The procedure had been explained to the patient  and consent obtained.  With the patient in the left lateral decubitus  position, the pediatric adjustable Olympus video colonoscope was inserted  and advanced.  The scope was advanced easily to the cecum.  The ileocecal  valve was seen and entered.  The terminal ileum was entered for  approximately 10 cm and was normal.  The scope was withdrawn and the  terminal ileum, ascending colon, transverse colon, descending and sigmoid  colon were seen well upon withdrawal.  No polyps or other lesions were seen.  There was no evidence of inflammatory bowel disease.  No diverticulosis, no  signs of ongoing GI blood loss.  The scope was withdrawn.  The rectum was  free of lesions.  The scope was withdrawn.  The patient tolerated the  procedure well.   ASSESSMENT:  Recent gastrointestinal bleed with no obvious source in the  colon.   PLAN:  Will continue on iron and see back in the office in three to four  weeks.                                                 James L. Malon Kindle., M.D.    Waldron Session  D:  10/12/2002  T:   10/12/2002  Job:  [id number]

## 2010-08-22 NOTE — Op Note (Signed)
[name], [name]                 ACCOUNT NO.:  [id number]   MEDICAL RECORD NO.:  [id number]          PATIENT TYPE:  AMB   LOCATION:  NESC                         FACILITY:  [facility]   PHYSICIAN:  Sigmund I. Patsi Sears, M.D.DATE OF BIRTH:  12/29/1980   DATE OF PROCEDURE:  08/27/2005  DATE OF DISCHARGE:                                 OPERATIVE REPORT   PREOPERATIVE DIAGNOSIS:  Scrotal mass/cyst.   POSTOPERATIVE DIAGNOSIS:  Scrotal mass/cyst.   OPERATION/PROCEDURE:  Excision of scrotal mass/cyst.   ATTENDING SURGEON:  Sigmund I. Patsi Sears, M.D.   ASSISTANT:  Terie Purser, M.D.   ANESTHESIA:  General endotracheal anesthesia.   COMPLICATIONS:  None.   ESTIMATED BLOOD LOSS:  Minimal.   DRAINS:  None.   COMPLICATIONS:  None.   DISPOSITION:  Stable to post anesthesia care unit.   INDICATIONS:  The patient is a 30 year old male who has had evaluation by  Dr. Patsi Sears for a left scrotal mass.  It appears that this is a sebaceous  cyst in the anterior surface of the scrotum.  We approximated the surgical  excision.  Benefits and risks were explained and consent was obtained.   DESCRIPTION OF PROCEDURE:  The patient was brought to the operating room,  properly identified.  A time-out was performed to confirm correct patient  and procedure inside.  He was administered general anesthesia and given  preoperative antibiotics and placed in the supine position, prepped and  draped in the sterile fashion.  On examination, he did have a large,  approximately 1-2 cm sebaceous cyst in the anterior aspect of the left  hemiscrotum.  There was also smaller, approximately 5 mm cyst in the lateral  aspect of the left hemiscrotum.  An elliptical incision was made around the  mass and it was excised sharply.  The mass was excised in its entirety.  Bovie was used to control any bleeding.  Hemostasis was excellent.  The skin  was then closed in a subcuticular fashion using 4-0 Vicryl suture.  We  then  made a small elliptical incision around the smaller cyst and this again was  removed sharply in its entirety.  Hemostasis was obtained with the cautery.  Subcutaneous 4-0 Vicryl suture was  used to close the skin.  A Dermabond was applied to both incisions.  The  patient was then awakened from anesthesia and transported to the recovery  room in stable condition.  There were no complications.   Please note that Dr. Patsi Sears was present and participated in all aspects  of this procedure.     ______________________________  Terie Purser, MD      Sigmund I. Patsi Sears, M.D.  Electronically Signed    JH/MEDQ  D:  08/27/2005  T:  08/27/2005  Job:  [id number]

## 2010-08-22 NOTE — Discharge Summary (Signed)
[name], [name]                 ACCOUNT NO.:  [id number]   MEDICAL RECORD NO.:  [id number]          PATIENT TYPE:  IPS   LOCATION:  0305                          [facility]ACILITY:  [facility]   PHYSICIAN:  Jasmine Pang, M.D. DATE OF BIRTH:  [birth year]   DATE OF ADMISSION:  04/04/2007  DATE OF DISCHARGE:  04/14/2007                               DISCHARGE SUMMARY   IDENTIFICATION:  This is a 30 year old [place] male, single male who  was admitted on involuntary basis.  The patient was referred by [facility]   HISTORY PRESENT ILLNESS:  The patient was referred by [facility] where he had been presented after his brother  petitioned on him.  The brother reported that the patient has been  threatening to kill himself and making threats about hurting his  brothers.  He also made statements that he had resumed using alcohol and  drugs, that he sees demons coming to harm him.  He was also concerned  that he had weight loss recently and was not sleeping well and appeared  paranoid.  The patient himself categorically denied this.  He states he  had some chronic conflict with his brothers over the course of the past  couple of months that involved the seeming responsibility for his  brother's car insurance, since his brother had incurred a DWI and was  unable to get insurance of his own.  He was denying any suicidal or  homicidal thoughts.  He states he has been using some alcohol on the  weekends, just small amount, maybe a beer in order to get to sleep at  night.  This is the first psychiatric admission for this patient.  He  self reports that he has been seeing Dr. 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  This course of treatment occurred over the last year at  some point.  He did take some type of medication, but does not remember  the name of it.  He denies any current substance abuse.  He  denies a  history of suicide attempts.  He denies that he has been under treatment  now.  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  He states he is  happy making money and recently move out of the home with his brothers  and get his own apartment.  He has current herpes labialis.   He has a past medical history for gastritis, history of critical low  hemoglobin with transfusion in 2004.  He is on ranitidine 150 mg p.o.  b.i.d. for his gastritis.   He has no known drug allergies.   PHYSICAL FINDINGS:  The patient's physical exam was within normal  limits.  There were no acute medical or physical problems noted.  He did  have some clear vesicles on his right lower lip border from the herpes.   The CBC was grossly within normal limits except for  an elevated WBC of  14.5, and decreased hemoglobin of 10.4 and hematocrit of 34.1.  Basic  metabolic panel was within normal limits except for slightly decreased  potassium of 3.4 and random glucose of 123, was elevated.  Hepatic  profile was within normal limits.  TSH was within normal limits at  1.981.  Urine drug screen was positive for marijuana and negative for  all other drugs of abuse.  His urinalysis was within normal limits.   HOSPITAL COURSE:  Upon admission, the patient was placed on Ambien 10 mg  p.o. q.h.s. p.r.n.  He was also given Denavir cream to apply to lesions  every 2 hours x4 days.  He was started back on his ranitidine 150 mg  p.o. b.i.d.  The patient was started on Risperdal 0.5 mg p.o. q.h.s.;  however, he refused to take this medication at all during the  hospitalization.  He refused any psychotropic medication and ultimately  got to the point where he was refusing his ranitidine as well.  The  patient was reserved and anxious in sessions with me.  He states he did  not know why he was here.  He denied the claims that were on the [facility]  papers that  he had threatened to kill himself.  He states he had a  conflict with his brother and he feels this is why brother committed  him.  His mood continued to be dysphoric, anxious, and angry.  There was  possibly some disorganized thinking.  Initially, he stated that he felt  one hand was bigger than the other.  He was very preoccupied with a  bruise from the phlebotomy stick.  He seemed paranoid especially about  his brother's committing him.  He refused to let us talk to his  brothers.  As hospitalization progressed, he stated his mood was good.  He denied any suicidal or homicidal ideation.  He continued to refuse  medication.  He was pleasant and polite, but stayed in his room most of  the time with the lights out, staying in bed.  As hospitalization  progressed, he began to participate in some of the unit groups.  He had  been unwilling to do this before.  He still states somewhat isolated in  his room.  He still would not let us to communicate with his family.  The brother is very concerned about his paranoia and his threats towards  them.  They requested that they be alerted when he is discharged because  of the concerns that he may hurt them.  The family was worried that he  will retaliate if discharged.  On April 14, 2007, mental status had  improved from admission status.  The patient was up and sitting in the  day room.  He stated he was feeling good.  He denies suicidal or  homicidal ideation.  No self-injurious behavior.  He denied auditory or  visual hallucinations.  There was no paranoia or delusions.  Thoughts  were logical and goal-directed.  Thought content, no predominant theme.  Cognitive was grossly back to baseline.  Sleep was good.  Appetite was  good.  Again his brother was alerted as he requested about [name]'s  discharge due to his fear that the patient may harm him.  It was felt  [name] was able to be discharged today.   DISCHARGE DIAGNOSES:  Axis I:  Mood disorder,  not otherwise specified.  Axis II:  None.  Axis III:  Herpes labialis and  microcytic anemia.  Axis IV:  Severe (chronic relationship conflict with brothers according  to his history, burden of psychiatric illness, other psychosocial  problems).  Axis V:  GAF was 50 upon discharge.  GAF was 39 upon admission.  GAF  highest past year was 73.   DISCHARGE PLAN:  There were no specific activity level or dietary  restrictions.   POST-HOSPITAL CARE PLANS:  The patient refused followup.  So, no  appointments were made for him.  He was initially going to be referred  to the [facility].   DISCHARGE MEDICATIONS:  None.  The patient refused all medications.      Jasmine Pang, M.D.  Electronically Signed     BHS/MEDQ  D:  04/25/2007  T:  04/26/2007  Job:  [id number]

## 2010-08-22 NOTE — Consult Note (Signed)
NAME:  [name], [name]                           ACCOUNT NO.:  [id number]   MEDICAL RECORD NO.:  [id number]                   PATIENT TYPE:  INP   LOCATION:  0350                                 FACILITY:  [facility]   PHYSICIAN:  James L. Malon Kindle., M.D.          DATE OF BIRTH:  03/11/1981   DATE OF CONSULTATION:  10/05/2002  DATE OF DISCHARGE:                                   CONSULTATION   REPORT TITLE:  GASTROENTEROLOGY CONSULTATION   REFERRING PHYSICIAN:  Lilyan Punt. Sydnee Levans, M.D.   REASON FOR CONSULTATION:  Hemoglobin at 4.9 with heme-positive stools.   HISTORY:  This is a 30 year old [place] male who has been in the [place] for  four years.  He was told back in the [place] that he had gastritis and was  told to intermittently take Zantac and Prilosec for it.  He did not have an  endoscopy but apparently the doctor who made this clinical diagnosis had  told him that.  He has intermittently had epigastric pain for the past  several years.  Whenever he takes Zantac or Prilosec or some other medicine  of this nature, after several days the pain gets better.   For the past several weeks, he has had dark stools that have been somewhat  loose with epigastric pain as before.  He has been self-treating this with  over-the-counter Prilosec and Zantac which he has been taking intermittently  but has had some improvement.  The pain is worse after eating but, at this  point in time, it is much better.  He has had no real nausea or vomiting.  Initially, he had pain with swallowing in the epigastric area.  Right after  he would swallow, particularly with solids, it would become uncomfortable  and then it would improve.  This has improved now as well.  Currently, he is  pain-free.  He has had weakness and fatigue that had been progressively  worse over the past several weeks as well as a headache which has been  particularly bad today.  He has been to the emergency room for this.  Has  been on  Duradrin which is a migraine formulation for his headache which  contains acetaminophen and muscle relaxant but no aspirin.   CURRENT MEDICATIONS:  Tylenol and Duradrin for headache.   ALLERGIES:  He is intolerant of ASPIRIN and IBUPROFEN which flare up his  gastritis but has had no frank drug allergies.   PAST MEDICAL HISTORY:  1. Chronic gastritis.  2. He has had an operation in his right lower quadrant done as an emergency     when he was a child but is unable to tell me what it was.  It sounds as     if it was an appendectomy.  No other surgeries.   FAMILY HISTORY:  Mother and father are both living.  As far as he knows they  are  all well.  There is no family history of ulcers or cancers.   SOCIAL HISTORY:  Lives with his family in [place].  Works in a factory.  Has a long history of smoking.  Goes to the school part-time and drinks  intermittently.   PHYSICAL EXAMINATION:  VITAL SIGNS:  On admission, 100% O2 saturation,  normal blood pressure and pulse 86.  He is afebrile.  GENERAL:  This is an alert, oriented, well-spoken [place] male.  He is  pale.  HEENT:  Extraocular movements intact.  Pupils equal and reactive to light.  Sclerae nonicteric.  NECK:  Supple.  LUNGS:  Clear.  HEART:  Regular rate and rhythm without murmurs or gallops.  ABDOMEN:  Soft and completely nontender to my examination at this point.  His liver span is normal.  I cannot palpate his spleen.  RECTAL:  Is not repeated.  It was done by Dr, Sydnee Levans and the stool was  trace guaiac-positive but light.   LABORATORY DATA:  Hemoglobin 4.9 with MCV 61.  Liver tests were normal,  albumin 3.7.  ProTime 12.6.  BUN and creatinine normal.  His acetaminophen  level is less than 10.   ASSESSMENT:  Subacute gastrointestinal bleed, probably upper  gastrointestinal based on his long history of similar symptoms and  improvement with acid reduction.  I think we definitely need to go ahead and  do an endoscopy.   It is very likely with a low MCV that he has some  underlying hemoglobinopathies such as thalassemia or even a sickle trait.  We will plan to go ahead with transfusion and proceed in the morning with an  endoscopy.  I have discussed this with him including the issue of sedation,  the risks and benefits, and he is agreeable.                                               James L. Malon Kindle., M.D.    Waldron Session  D:  10/05/2002  T:  10/05/2002  Job:  [id number]   cc:   Lilyan Punt. Sydnee Levans, M.D.  [address]  [place]  [place] [address]  Fax: [phone]

## 2010-08-22 NOTE — Discharge Summary (Signed)
NAME:  [name], [name]                           ACCOUNT NO.:  [id number]   MEDICAL RECORD NO.:  [id number]                   PATIENT TYPE:  INP   LOCATION:  0350                                 FACILITY:  [facility]   PHYSICIAN:  Lilyan Punt. Sydnee Levans, M.D.             DATE OF BIRTH:  [birth year]   DATE OF ADMISSION:  10/05/2002  DATE OF DISCHARGE:  10/07/2002                                 DISCHARGE SUMMARY   PROCEDURE:  EGD on October 06, 2002.   INDICATIONS:  Anemia and trace hemoccult-positive stool.   FINDINGS:  1. No active bleeding.  2. Slight gastritis.  3. Multiple biopsies pending to rule out celiac disease as well as CLO     testing.   FOLLOWUP RECOMMENDATIONS:  Colonoscopy outpatient.  Check iron studies.   DIAGNOSES:  1. Anemia, critical.  Presenting hemoglobin of 4.9, resolved after 3 units     of transfusion to a hemoglobin level of 9.8 at discharge that has     remained stable for 24 hours.  2. Trace Hemoccult positive stool.  3. Chronic gastritis per patient history and of the above recent findings at     EGD.  4. Anemia, microcytic, likely chronic iron deficiency.  Indices:  MCV quite     low at 61.4 on the initial sample, RDW 22.6, ferritin less than 3, iron     total less than 10, TIBC is not recorded as reported, ESR 30, TSH 1.8.  5. Parietal headaches, resolved.  Head CT negative during this admission.   DISCHARGE MEDICATIONS:  1. Protonix 40 mg p.o. once daily.  2. Iron sulfate 325 mg b.i.d. every three weeks, then recheck further     recommendations.  3. Tylenol [phone] mg q.6h. p.r.n.   ACTIVITY:  May return to work July 9.   DIET:  Instructions per __________  gastritis and post GED.   SPECIAL INSTRUCTIONS:  He was advised to avoid use of OTC analgesics,  aspirin, Aleve, ibuprofen, ketoprofen, or similar analgesics.  Follow up  Fayrene Fearing L. Randa Evens, M.D., gastroenterology.  The patient is to call the office  [phone] to follow up in approximately 7-10 days.   Recheck hemoglobin and  redraw TIBC.  Followup biopsy from CLO studies and schedule colonoscopy.   HISTORY OF PRESENT ILLNESS:  A 30 year old [place] man who presented with  weakness and headaches.  He had been in the ER just several days prior for  headaches and he was discharged on migraine type analgesics.  Because of no  improvement he represented.  He complained of dark black watery stools three  to four weeks' duration.  He relayed a prior diagnosis of gastritis that  had been established by a doctor in the [place].  It is made worse by certain  foods and use of alcohol and smoking.  He had to quit drinking alcohol  because of severe pain that it was causing  him.  His headache was not  associated with photophobia, tinnitus, weakness, visual aura, or scotoma.  He had been taking multiple types of over-the-counter medications without  relief.  Stated that he had taken a bottle of 60 Tylenol over a four day  period three days prior to admission.  Acetaminophen level in the ED was  negative.   HOSPITAL COURSE:  Is admitted.  Received IV fluids and transfusion x3 units.  GI consultation was obtained with results as noted above.  Further  outpatient follow-up was planned.  CT scan head was unremarkable.  Hemodynamically patient remained stable.  Post EGD his diet was advanced.  He was ambulating without difficulty and was discharged home on the third  hospital day in much improved condition.                                               Lilyan Punt Sydnee Levans, M.D.    KCS/MEDQ  D:  10/07/2002  T:  10/08/2002  Job:  [id number]   cc:   Fayrene Fearing L. Malon Kindle., M.D.  [address]  [place]  [place] [address]  Fax: [phone]

## 2010-08-22 NOTE — Op Note (Signed)
   NAME:  [name], [name]                           ACCOUNT NO.:  [id number]   MEDICAL RECORD NO.:  [id number]                   PATIENT TYPE:  INP   LOCATION:  0350                                 FACILITY:  [facility]   PHYSICIAN:  James L. Malon Kindle., M.D.          DATE OF BIRTH:  [birth year]   DATE OF PROCEDURE:  10/06/2002  DATE OF DISCHARGE:                                 OPERATIVE REPORT   PROCEDURE:  Esophagogastroduodenoscopy with biopsy.   INDICATIONS FOR PROCEDURE:  The patient came in with a hemoglobin of 4.9  with only slight heme positive stool, stool was brown. He had had previous  dyspepsia, been taking over the counter Prilosec with marked improvement.  Upper endoscopy is done to look for any source of upper GI bleeding.   DESCRIPTION OF PROCEDURE:  The procedure had been explained to the patient  and consent obtained. With the patient in the left lateral decubitus  position, the Olympus scope was inserted and advanced. The stomach was  entered, pylorus identified and passed. We advanced way down to the second  duodenum, this appeared endoscopically normal, biopsies were taken to rule  out celiac disease. The duodenal bulb was free of duodenitis or ulceration.  The scope was withdrawn back into the stomach. There was streaky gastritis  present. No active ulceration. No signs of recent or current bleeding.  Biopsies taken for rapid urease test for helicobacter, fundus and cardia  seen under retroflexed view and were normal. The scope was withdrawn and the  distal and proximal esophagus were endoscopically normal. Scope withdrawn,  patient tolerated the procedure well.   ASSESSMENT:  1. No source of active bleeding.  2. Slight gastritis of questionable significance.   PLAN:  Will check path on the biopsies of his small bowel, start on iron  therapy. Check iron studies with next blood drawn. The patient will need a  colonoscopy at some point, possibly as an  outpatient.                                               James L. Malon Kindle., M.D.    Waldron Session  D:  10/06/2002  T:  10/06/2002  Job:  [id number]   cc:   Lilyan Punt. Sydnee Levans, M.D.  [address]  [place]  [place] [address]  Fax: [phone]

## 2010-12-29 LAB — DIFFERENTIAL
Basophils Absolute: 0.2 — ABNORMAL HIGH
Eosinophils Absolute: 0.2
Lymphocytes Relative: 8 — ABNORMAL LOW
Lymphs Abs: 1.4
Neutro Abs: 14.7 — ABNORMAL HIGH

## 2010-12-29 LAB — COMPREHENSIVE METABOLIC PANEL
AST: 39 — ABNORMAL HIGH
Albumin: 3.2 — ABNORMAL LOW
Alkaline Phosphatase: 89
BUN: 11
Chloride: 101
Potassium: 3.3 — ABNORMAL LOW
Total Bilirubin: 0.4

## 2010-12-29 LAB — CBC
HCT: 33 — ABNORMAL LOW
HCT: 33.7 — ABNORMAL LOW
Hemoglobin: 10.4 — ABNORMAL LOW
Hemoglobin: 10.5 — ABNORMAL LOW
Platelets: 385
RBC: 5.36
WBC: 12.6 — ABNORMAL HIGH
WBC: 17.7 — ABNORMAL HIGH

## 2010-12-29 LAB — URINALYSIS, ROUTINE W REFLEX MICROSCOPIC
Bilirubin Urine: NEGATIVE
Glucose, UA: 100 — AB
Hgb urine dipstick: NEGATIVE
Nitrite: NEGATIVE
Specific Gravity, Urine: 1.021
pH: 7

## 2010-12-29 LAB — BASIC METABOLIC PANEL
GFR calc Af Amer: >60
GFR calc non Af Amer: >60
Potassium: 4.2
Sodium: 141

## 2010-12-29 LAB — RAPID URINE DRUG SCREEN, HOSP PERFORMED
Amphetamines: NOT DETECTED
Tetrahydrocannabinol: POSITIVE — AB

## 2010-12-29 LAB — ETHANOL: Alcohol, Ethyl (B): <5

## 2011-01-09 LAB — COMPREHENSIVE METABOLIC PANEL
Albumin: 3.9
Alkaline Phosphatase: 91
BUN: 2 — ABNORMAL LOW
Creatinine, Ser: 0.77
Potassium: 3.4 — ABNORMAL LOW
Total Protein: 7.6

## 2011-01-09 LAB — CBC
HCT: 34.1 — ABNORMAL LOW
Platelets: 451 — ABNORMAL HIGH
RDW: 24 — ABNORMAL HIGH

## 2011-01-09 LAB — THC (MARIJUANA), URINE, CONFIRMATION: Marijuana, Ur-Confirmation: 61 ng/mL

## 2011-01-09 LAB — URINALYSIS, ROUTINE W REFLEX MICROSCOPIC
Glucose, UA: NEGATIVE
Ketones, ur: NEGATIVE
Protein, ur: NEGATIVE
Urobilinogen, UA: 0.2

## 2011-01-09 LAB — DRUGS OF ABUSE SCREEN W/O ALC, ROUTINE URINE
Cocaine Metabolites: NEGATIVE
Opiate Screen, Urine: NEGATIVE
Phencyclidine (PCP): NEGATIVE
Propoxyphene: NEGATIVE

## 2015-03-05 ENCOUNTER — Encounter (HOSPITAL_COMMUNITY): Payer: Self-pay | Admitting: *Deleted

## 2015-03-05 ENCOUNTER — Emergency Department (HOSPITAL_COMMUNITY): Payer: Medicaid Other

## 2015-03-05 ENCOUNTER — Inpatient Hospital Stay (HOSPITAL_COMMUNITY)
Admission: EM | Admit: 2015-03-05 | Discharge: 2015-03-08 | DRG: 918 | Disposition: A | Discharge status: Home / Self Care | Payer: Medicaid Other | Attending: Internal Medicine | Admitting: Internal Medicine

## 2015-03-05 DIAGNOSIS — T43291A Poisoning by other antidepressants, accidental (unintentional), initial encounter: Secondary | ICD-10-CM | POA: Diagnosis present

## 2015-03-05 DIAGNOSIS — D72829 Elevated white blood cell count, unspecified: Secondary | ICD-10-CM

## 2015-03-05 DIAGNOSIS — M25511 Pain in right shoulder: Secondary | ICD-10-CM | POA: Insufficient documentation

## 2015-03-05 DIAGNOSIS — F1210 Cannabis abuse, uncomplicated: Secondary | ICD-10-CM | POA: Diagnosis present

## 2015-03-05 DIAGNOSIS — F209 Schizophrenia, unspecified: Secondary | ICD-10-CM | POA: Diagnosis present

## 2015-03-05 DIAGNOSIS — F17290 Nicotine dependence, other tobacco product, uncomplicated: Secondary | ICD-10-CM | POA: Diagnosis present

## 2015-03-05 DIAGNOSIS — T50901A Poisoning by unspecified drugs, medicaments and biological substances, accidental (unintentional), initial encounter: Secondary | ICD-10-CM | POA: Diagnosis not present

## 2015-03-05 DIAGNOSIS — R7989 Other specified abnormal findings of blood chemistry: Secondary | ICD-10-CM | POA: Diagnosis not present

## 2015-03-05 DIAGNOSIS — M79601 Pain in right arm: Secondary | ICD-10-CM

## 2015-03-05 DIAGNOSIS — R569 Unspecified convulsions: Secondary | ICD-10-CM | POA: Diagnosis present

## 2015-03-05 DIAGNOSIS — R52 Pain, unspecified: Secondary | ICD-10-CM

## 2015-03-05 DIAGNOSIS — R4182 Altered mental status, unspecified: Secondary | ICD-10-CM | POA: Diagnosis not present

## 2015-03-05 DIAGNOSIS — R945 Abnormal results of liver function studies: Secondary | ICD-10-CM

## 2015-03-05 DIAGNOSIS — R000 Tachycardia, unspecified: Secondary | ICD-10-CM | POA: Diagnosis present

## 2015-03-05 DIAGNOSIS — E872 Acidosis: Secondary | ICD-10-CM | POA: Diagnosis present

## 2015-03-05 DIAGNOSIS — I4581 Long QT syndrome: Secondary | ICD-10-CM | POA: Diagnosis not present

## 2015-03-05 DIAGNOSIS — F203 Undifferentiated schizophrenia: Secondary | ICD-10-CM | POA: Diagnosis not present

## 2015-03-05 DIAGNOSIS — T50904A Poisoning by unspecified drugs, medicaments and biological substances, undetermined, initial encounter: Secondary | ICD-10-CM | POA: Diagnosis not present

## 2015-03-05 LAB — CBG MONITORING, ED: GLUCOSE-CAPILLARY: 146 mg/dL — AB (ref 65–99)

## 2015-03-05 LAB — COMPREHENSIVE METABOLIC PANEL
ALBUMIN: 4.1 g/dL (ref 3.5–5.0)
ALK PHOS: 94 U/L (ref 38–126)
ALT: 97 U/L — AB (ref 17–63)
ANION GAP: 11 (ref 5–15)
AST: 62 U/L — ABNORMAL HIGH (ref 15–41)
BUN: 10 mg/dL (ref 6–20)
CALCIUM: 9.4 mg/dL (ref 8.9–10.3)
CHLORIDE: 104 mmol/L (ref 101–111)
CO2: 21 mmol/L — AB (ref 22–32)
CREATININE: 0.93 mg/dL (ref 0.61–1.24)
GFR calc non Af Amer: >60 mL/min (ref >60)
GLUCOSE: 151 mg/dL — AB (ref 65–99)
Potassium: 3.7 mmol/L (ref 3.5–5.1)
SODIUM: 136 mmol/L (ref 135–145)
Total Bilirubin: 0.5 mg/dL (ref 0.3–1.2)
Total Protein: 7.3 g/dL (ref 6.5–8.1)

## 2015-03-05 LAB — URINALYSIS, ROUTINE W REFLEX MICROSCOPIC
Bilirubin Urine: NEGATIVE
GLUCOSE, UA: NEGATIVE mg/dL
Hgb urine dipstick: NEGATIVE
Ketones, ur: NEGATIVE mg/dL
LEUKOCYTES UA: NEGATIVE
NITRITE: NEGATIVE
PH: 5.5 (ref 5.0–8.0)
Protein, ur: NEGATIVE mg/dL
SPECIFIC GRAVITY, URINE: 1.012 (ref 1.005–1.030)

## 2015-03-05 LAB — CBC WITH DIFFERENTIAL/PLATELET
BASOS PCT: 0 %
Basophils Absolute: 0.1 10*3/uL (ref 0.0–0.1)
EOS ABS: 0.2 10*3/uL (ref 0.0–0.7)
EOS PCT: 1 %
HCT: 45.6 % (ref 39.0–52.0)
HEMOGLOBIN: 15.6 g/dL (ref 13.0–17.0)
Lymphocytes Relative: 21 %
Lymphs Abs: 3 10*3/uL (ref 0.7–4.0)
MCH: 30.6 pg (ref 26.0–34.0)
MCHC: 34.2 g/dL (ref 30.0–36.0)
MCV: 89.6 fL (ref 78.0–100.0)
Monocytes Absolute: 1.2 10*3/uL — ABNORMAL HIGH (ref 0.1–1.0)
Monocytes Relative: 8 %
NEUTROS PCT: 70 %
Neutro Abs: 10 10*3/uL — ABNORMAL HIGH (ref 1.7–7.7)
PLATELETS: 249 10*3/uL (ref 150–400)
RBC: 5.09 MIL/uL (ref 4.22–5.81)
RDW: 14.1 % (ref 11.5–15.5)
WBC: 14.5 10*3/uL — AB (ref 4.0–10.5)

## 2015-03-05 LAB — RAPID URINE DRUG SCREEN, HOSP PERFORMED
Amphetamines: NOT DETECTED
BARBITURATES: NOT DETECTED
BENZODIAZEPINES: NOT DETECTED
COCAINE: NOT DETECTED
Opiates: NOT DETECTED
TETRAHYDROCANNABINOL: NOT DETECTED

## 2015-03-05 LAB — ACETAMINOPHEN LEVEL

## 2015-03-05 LAB — SALICYLATE LEVEL

## 2015-03-05 LAB — ETHANOL

## 2015-03-05 MED ORDER — SODIUM CHLORIDE 0.9 % IV SOLN
INTRAVENOUS | Status: DC
  Administered 2015-03-06 – 2015-03-07 (×3): via INTRAVENOUS

## 2015-03-05 MED ORDER — LORAZEPAM 2 MG/ML IJ SOLN: 2.0000 mg | INTRAMUSCULAR | Status: DC | PRN

## 2015-03-05 MED ORDER — MAGNESIUM SULFATE 2 GM/50ML IV SOLN
2.0000 g | INTRAVENOUS | Status: AC
  Administered 2015-03-05: 2 g via INTRAVENOUS
  Filled 2015-03-05: qty 50

## 2015-03-05 MED ORDER — LORAZEPAM 2 MG/ML IJ SOLN
1.0000 mg | INTRAMUSCULAR | Status: DC | PRN
  Filled 2015-03-05: qty 1

## 2015-03-05 MED ORDER — FAMOTIDINE IN NACL 20-0.9 MG/50ML-% IV SOLN
20.0000 mg | Freq: Two times a day (BID) | INTRAVENOUS | Status: DC
Start: 2015-03-05 — End: 2015-03-08
  Administered 2015-03-05 – 2015-03-08 (×6): 20 mg via INTRAVENOUS
  Filled 2015-03-05 (×7): qty 50

## 2015-03-05 MED ORDER — LORAZEPAM 2 MG/ML IJ SOLN
2.0000 mg | Freq: Once | INTRAMUSCULAR | Status: AC
  Administered 2015-03-05: 2 mg via INTRAVENOUS

## 2015-03-05 MED ORDER — SODIUM CHLORIDE 0.9 % IV SOLN: 250.0000 mL | INTRAVENOUS | Status: DC | PRN

## 2015-03-05 MED ORDER — INFLUENZA VAC SPLIT QUAD 0.5 ML IM SUSY
0.5000 mL | PREFILLED_SYRINGE | INTRAMUSCULAR | Status: DC
  Filled 2015-03-05 (×2): qty 0.5

## 2015-03-05 MED ORDER — LORAZEPAM 2 MG/ML IJ SOLN
INTRAMUSCULAR | Status: AC
  Administered 2015-03-05: 2 mg via INTRAVENOUS
  Filled 2015-03-05: qty 1

## 2015-03-05 MED ORDER — SODIUM CHLORIDE 0.9 % IV BOLUS (SEPSIS)
1000.0000 mL | Freq: Once | INTRAVENOUS | Status: AC
  Administered 2015-03-05: 1000 mL via INTRAVENOUS

## 2015-03-05 NOTE — ED Notes (Signed)
Pt unable to urinate at this time.  
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
## 2015-03-05 NOTE — ED Notes (Addendum)
Pt asking to make a phone call, pt had difficulty recalling number [phone], ems stated moms number is [phone]
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
## 2015-03-05 NOTE — ED Notes (Signed)
Pt. Unable to urinate at this time. Will collect urine when pt. Voids. Nurse aware.  

## 2015-03-05 NOTE — ED Notes (Signed)
Bed: RESA Expected date:  Expected time:  Means of arrival:  Comments: EMS- OD on unknown substance
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
## 2015-03-06 ENCOUNTER — Inpatient Hospital Stay (HOSPITAL_COMMUNITY): Payer: Medicaid Other

## 2015-03-06 LAB — BASIC METABOLIC PANEL
Anion gap: 10 (ref 5–15)
BUN: 6 mg/dL (ref 6–20)
CHLORIDE: 105 mmol/L (ref 101–111)
CO2: 22 mmol/L (ref 22–32)
Calcium: 8.8 mg/dL — ABNORMAL LOW (ref 8.9–10.3)
Creatinine, Ser: 0.91 mg/dL (ref 0.61–1.24)
GFR calc Af Amer: >60 mL/min (ref >60)
GLUCOSE: 135 mg/dL — AB (ref 65–99)
POTASSIUM: 3.9 mmol/L (ref 3.5–5.1)
SODIUM: 137 mmol/L (ref 135–145)

## 2015-03-06 LAB — HEPATIC FUNCTION PANEL
ALBUMIN: 4 g/dL (ref 3.5–5.0)
ALT: 96 U/L — ABNORMAL HIGH (ref 17–63)
AST: 72 U/L — AB (ref 15–41)
Alkaline Phosphatase: 105 U/L (ref 38–126)
BILIRUBIN DIRECT: 0.1 mg/dL (ref 0.1–0.5)
Indirect Bilirubin: 0.3 mg/dL (ref 0.3–0.9)
TOTAL PROTEIN: 7.2 g/dL (ref 6.5–8.1)
Total Bilirubin: 0.4 mg/dL (ref 0.3–1.2)

## 2015-03-06 LAB — CK TOTAL AND CKMB (NOT AT ARMC)
CK, MB: 2.8 ng/mL (ref 0.5–5.0)
RELATIVE INDEX: 1.7 (ref 0.0–2.5)
Total CK: 169 U/L (ref 49–397)

## 2015-03-06 LAB — MAGNESIUM: MAGNESIUM: 2.2 mg/dL (ref 1.7–2.4)

## 2015-03-06 LAB — CBC
HCT: 44.4 % (ref 39.0–52.0)
HEMOGLOBIN: 15.3 g/dL (ref 13.0–17.0)
MCH: 30.6 pg (ref 26.0–34.0)
MCHC: 34.5 g/dL (ref 30.0–36.0)
MCV: 88.8 fL (ref 78.0–100.0)
PLATELETS: 240 10*3/uL (ref 150–400)
RBC: 5 MIL/uL (ref 4.22–5.81)
RDW: 14.5 % (ref 11.5–15.5)
WBC: 16.8 10*3/uL — AB (ref 4.0–10.5)

## 2015-03-06 LAB — SALICYLATE LEVEL

## 2015-03-06 LAB — MRSA PCR SCREENING: MRSA by PCR: INVALID — AB

## 2015-03-06 LAB — LACTIC ACID, PLASMA: Lactic Acid, Venous: 3 mmol/L (ref 0.5–2.0)

## 2015-03-06 LAB — ACETAMINOPHEN LEVEL

## 2015-03-06 LAB — PHOSPHORUS: Phosphorus: 3 mg/dL (ref 2.5–4.6)

## 2015-03-06 MED ORDER — MUPIROCIN 2 % EX OINT
1.0000 "application " | TOPICAL_OINTMENT | Freq: Two times a day (BID) | CUTANEOUS | Status: DC
  Administered 2015-03-06 – 2015-03-08 (×5): 1 via NASAL
  Filled 2015-03-06 (×2): qty 22

## 2015-03-06 MED ORDER — CHLORHEXIDINE GLUCONATE CLOTH 2 % EX PADS
6.0000 | MEDICATED_PAD | Freq: Every day | CUTANEOUS | Status: DC
  Administered 2015-03-06 – 2015-03-08 (×3): 6 via TOPICAL

## 2015-03-06 MED ORDER — OXYCODONE-ACETAMINOPHEN 5-325 MG PO TABS
1.0000 | ORAL_TABLET | ORAL | Status: DC | PRN
  Administered 2015-03-06 – 2015-03-08 (×10): 2 via ORAL
  Filled 2015-03-06 (×10): qty 2

## 2015-03-06 MED ORDER — OXYCODONE-ACETAMINOPHEN 5-325 MG PO TABS
1.0000 | ORAL_TABLET | Freq: Four times a day (QID) | ORAL | Status: DC | PRN
  Administered 2015-03-06: 1 via ORAL
  Filled 2015-03-06: qty 1

## 2015-03-06 NOTE — Progress Notes (Signed)
Patient ID: [name], male   DOB: 10-04-1980, 34 y.o.   MRN: [id number] [facility] PROGRESS NOTE  [name] DOB: [birth year] DOA: 03/05/2015 PCP: No primary care provider on file.   Brief narrative:    34 year old male with history of prior suicide attempts due by overdose and polysubstance abuse. Presented to ED 11/29 after intentional overdose of unknown substances. He reported he took prescription medications. Reports taking over 20 pills that are prescribed to him. He claimed there was no intent of self-harm, and he was only doing this to get high as he likes the way these medicines make him feel. EMS was called and he was brought to [facility] ED where he stated he has used cocaine in the remote past and continues to have intermittent marijuana abuse. He did have one additional seizure in ED which responded to ativan, he was also administered magnesium.  Assessment/Plan:    Intentional overdose of unknown prescription drug (suspected to be Wellbutrin or antihistamine) - clinically stable this AM - psych consult in AM  Seizures  - secondary to overdose - seizure free in the past 24 hours  - OK to use ativan as needed   Tachycardia - Borderline QTC prolongation - keep on telemetry - pt denies chest pian or shortness of breath   Leukocytosis  - WBC trending up - if worse in am, will order CXR to rule out developing PNA  R shoulder pain - Xray negative - OK to use analgesia as needed  Transaminitis - in the pattern of alcohol use  - CMET in AM  DVT prophylaxis - SCD's  Code Status: Full.  Family Communication:  plan of care discussed with the patient Disposition Plan: Home 12/01  IV access:  Peripheral IV  Procedures and diagnostic studies:    Ct Head Wo Contrast 03/06/2015 No acute intracranial findings.    Dg Shoulder Right Port 03/05/2015   Negative.   Medical Consultants:  None  Other Consultants:  None  IAnti-Infectives:    None  Faye Ramsay, MD  [facility] Pager [phone]  If 7PM-7AM, please contact night-coverage [email or web address] Password [facility] 03/06/2015, 4:46 PM   LOS: 1 day   HPI/Subjective: No events overnight.   Objective: Filed Vitals:   03/06/15 0900 03/06/15 1200 03/06/15 1400 03/06/15 1600  BP: 142/90 134/82 140/87 125/80  Pulse: 101 95 93 78  Temp:  98.6 F (37 C)  98.3 F (36.8 C)  TempSrc:  Oral  Axillary  Resp: 19 18 22 17   Height:      Weight:      SpO2: 95% 93% 92% 92%    Intake/Output Summary (Last 24 hours) at 03/06/15 1646 Last data filed at 03/06/15 1200  Gross per 24 hour  Intake 1820.83 ml  Output   3750 ml  Net -1929.17 ml    Exam:   General:  Pt is alert, follows commands appropriately, not in acute distress  Cardiovascular: Regular rate and rhythm, no rubs, no gallops  Respiratory: Clear to auscultation bilaterally, no wheezing, no crackles, no rhonchi  Abdomen: Soft, non tender, non distended, bowel sounds present, no guarding  Extremities: No edema, pulses DP and PT palpable bilaterally  Neuro: Grossly nonfocal  Data Reviewed: Basic Metabolic Panel:  Recent Labs Lab 03/05/15 1739 03/06/15 0425  NA 136 137  K 3.7 3.9  CL 104 105  CO2 21* 22  GLUCOSE 151* 135*  BUN 10 6  CREATININE 0.93 0.91  CALCIUM 9.4 8.8*  MG  --  2.2  PHOS  --  3.0   Liver Function Tests:  Recent Labs Lab 03/05/15 1739 03/06/15 0425  AST 62* 72*  ALT 97* 96*  ALKPHOS 94 105  BILITOT 0.5 0.4  PROT 7.3 7.2  ALBUMIN 4.1 4.0   CBC:  Recent Labs Lab 03/05/15 1739 03/06/15 0425  WBC 14.5* 16.8*  NEUTROABS 10.0*  --   HGB 15.6 15.3  HCT 45.6 44.4  MCV 89.6 88.8  PLT 249 240   Cardiac Enzymes:  Recent Labs Lab 03/06/15 0425  CKTOTAL 169  CKMB 2.8   BNP: Invalid input(s): POCBNP CBG:  Recent Labs Lab 03/05/15 1715  GLUCAP 146*    Recent Results (from the past 240 hour(s))  MRSA PCR Screening     Status: Abnormal   Collection Time:  03/05/15  9:47 PM  Result Value Ref Range Status   MRSA by PCR INVALID RESULTS, SPECIMEN SENT FOR CULTURE (A) NEGATIVE Final     Scheduled Meds: . Chlorhexidine Gluconate Cloth  6 each Topical Q0600  . famotidine (PEPCID) IV  20 mg Intravenous Q12H  . Influenza vac split quadrivalent PF  0.5 mL Intramuscular Tomorrow-1000  . mupirocin ointment  1 application Nasal BID   Continuous Infusions: . sodium chloride 125 mL/hr at 03/06/15 [phone]

## 2015-03-06 NOTE — Care Management Note (Signed)
Case Management Note  Patient Details  Name: YASSEEN GROSZ MRN: [id number] Date of Birth: [birth year]  Subjective/Objective:      Overdose on prescription  medications            Action/Plan:Date: March 06, 2015 Chart reviewed for concurrent status and case management needs. Will continue to follow patient for changes and needs: Velva Harman, RN, BSN, [place]   [phone]   Expected Discharge Date:   (unknown)               Expected Discharge Plan:  Home/Self Care  In-House Referral:  Clinical Social Work  Discharge planning Services  CM Consult  Post Acute Care Choice:  NA Choice offered to:  NA  DME Arranged:    DME Agency:     HH Arranged:    [place] Agency:     Status of Service:  In process, will continue to follow  Medicare Important Message Given:    Date Medicare IM Given:    Medicare IM give by:    Date Additional Medicare IM Given:    Additional Medicare Important Message give by:     If discussed at [place] of Stay Meetings, dates discussed:    Additional Comments:  Leeroy Cha, RN 03/06/2015, 10:03 AM

## 2015-03-06 NOTE — Progress Notes (Signed)
Pt c/o of "pain medication not working". Dr. Doyle Askew made aware. Order received to change frequency of med from every 6 to every 4 hours. Vwilliams,rn.

## 2015-03-06 NOTE — Progress Notes (Signed)
Pt c/o severe pain to both shoulders. MD made aware. New orders given. Vwilliams,rn.

## 2015-03-06 NOTE — Progress Notes (Signed)
Call received from [facility] with poison control requesting updates on pt. Updates given to [facility], who in turn recomended another EKG at around 6 pm this evening. Dr. Doyle Askew made aware. Vwilliams,rn.

## 2015-03-07 ENCOUNTER — Inpatient Hospital Stay (HOSPITAL_COMMUNITY)
Admit: 2015-03-07 | Discharge: 2015-03-07 | Disposition: A | Discharge status: Home / Self Care | Payer: Medicaid Other | Attending: Internal Medicine | Admitting: Internal Medicine

## 2015-03-07 ENCOUNTER — Inpatient Hospital Stay (HOSPITAL_COMMUNITY): Payer: Medicaid Other

## 2015-03-07 DIAGNOSIS — R7989 Other specified abnormal findings of blood chemistry: Secondary | ICD-10-CM

## 2015-03-07 DIAGNOSIS — I4581 Long QT syndrome: Secondary | ICD-10-CM

## 2015-03-07 DIAGNOSIS — D72829 Elevated white blood cell count, unspecified: Secondary | ICD-10-CM

## 2015-03-07 DIAGNOSIS — F203 Undifferentiated schizophrenia: Secondary | ICD-10-CM

## 2015-03-07 LAB — COMPREHENSIVE METABOLIC PANEL
ALBUMIN: 3.5 g/dL (ref 3.5–5.0)
ALK PHOS: 89 U/L (ref 38–126)
ALT: 64 U/L — AB (ref 17–63)
AST: 33 U/L (ref 15–41)
Anion gap: 7 (ref 5–15)
BILIRUBIN TOTAL: 0.8 mg/dL (ref 0.3–1.2)
BUN: 6 mg/dL (ref 6–20)
CO2: 26 mmol/L (ref 22–32)
CREATININE: 0.85 mg/dL (ref 0.61–1.24)
Calcium: 8.8 mg/dL — ABNORMAL LOW (ref 8.9–10.3)
Chloride: 106 mmol/L (ref 101–111)
GFR calc Af Amer: >60 mL/min (ref >60)
GFR calc non Af Amer: >60 mL/min (ref >60)
GLUCOSE: 110 mg/dL — AB (ref 65–99)
POTASSIUM: 4.3 mmol/L (ref 3.5–5.1)
Sodium: 139 mmol/L (ref 135–145)
TOTAL PROTEIN: 6.5 g/dL (ref 6.5–8.1)

## 2015-03-07 LAB — CBC
HEMATOCRIT: 42.6 % (ref 39.0–52.0)
Hemoglobin: 14.2 g/dL (ref 13.0–17.0)
MCH: 30.7 pg (ref 26.0–34.0)
MCHC: 33.3 g/dL (ref 30.0–36.0)
MCV: 92 fL (ref 78.0–100.0)
PLATELETS: 206 10*3/uL (ref 150–400)
RBC: 4.63 MIL/uL (ref 4.22–5.81)
RDW: 15 % (ref 11.5–15.5)
WBC: 11.1 10*3/uL — ABNORMAL HIGH (ref 4.0–10.5)

## 2015-03-07 LAB — PHOSPHORUS: PHOSPHORUS: 3.3 mg/dL (ref 2.5–4.6)

## 2015-03-07 LAB — MAGNESIUM: Magnesium: 1.9 mg/dL (ref 1.7–2.4)

## 2015-03-07 MED ORDER — CYCLOBENZAPRINE HCL 5 MG PO TABS
5.0000 mg | ORAL_TABLET | Freq: Three times a day (TID) | ORAL | Status: DC | PRN
  Administered 2015-03-07 – 2015-03-08 (×3): 5 mg via ORAL
  Filled 2015-03-07 (×3): qty 1

## 2015-03-07 NOTE — Procedures (Signed)
ELECTROENCEPHALOGRAM REPORT   Patient: [name]      Room #: [id number] Age: 34 y.o.        Sex: male Referring Physician: Dr Erlinda Hong Report Date:  03/07/2015        Interpreting Physician: Hulen Luster  History: [name] is an 34 y.o. male admitted with medication overdose  Medications:  I have reviewed the patient's current medications.  Conditions of Recording:  This is a 16 channel EEG carried out with the patient in the drowsy state.  Description:  The waking background activity consists of a low voltage, symmetrical, fairly well organized, 8=9 Hz alpha activity, seen from the parieto-occipital and posterior temporal regions.  Low voltage fast activity, poorly organized, is seen anteriorly and is at times superimposed on more posterior regions. No focal slowing or epileptiform activity is noted.   Hyperventilation was not performed. Intermittent photic stimulation was not performed.   IMPRESSION: Normal electroencephalogram. There are no focal lateralizing or epileptiform features.   Jim Like, DO [facility] [phone]  If 7pm- 7am, please page neurology on call as listed in [place]. 03/07/2015, 5:19 PM

## 2015-03-07 NOTE — Progress Notes (Signed)
EEG completed; results pending.    

## 2015-03-07 NOTE — Progress Notes (Signed)
Patient ID: [name], male   DOB: 1980-11-10, 34 y.o.   MRN: [id number] [facility] PROGRESS NOTE  [name] [id number] DOB: December 18, 1980 DOA: 03/05/2015 PCP: No primary care provider on file.   Brief narrative:    34 year old male with history of prior suicide attempts due by overdose and polysubstance abuse. Presented to ED 11/29 after intentional overdose of unknown substances. He reported he took prescription medications. Reports taking over 20 pills that are prescribed to him. He claimed there was no intent of self-harm, and he was only doing this to get high as he likes the way these medicines make him feel. EMS was called and he was brought to [facility] ED where he stated he has used cocaine in the remote past and continues to have intermittent marijuana abuse. He did have one additional seizure in ED which responded to ativan, he was also administered magnesium.  Assessment/Plan:    Intentional overdose of unknown prescription drug (suspected to be Wellbutrin or antihistamine) - clinically stable  - psych consulted  Seizures  - secondary to overdose - seizure free since admitted - OK to use ativan as needed  EEG unremarkable  Tachycardia - Borderline QTC prolongation QTC 485 on admission -  Telemetry no arrhythmia, repeat ekg QTc normalized, d/c tele. - pt denies chest pian or shortness of breath   Leukocytosis  - Wbc 14.5 on admission, no fever, does has elevated lactic acid, suspect from seizure. ua unremarkble. -  CXR pending, but no cough, no sob  R shoulder pain - Xray negative - OK to use analgesia as needed  Transaminitis - in the pattern of alcohol use  - will check hepatitis panel, abdominal US  DVT prophylaxis - SCD's  Code Status: Full.  Family Communication:  plan of care discussed with the patient Disposition Plan: pending psych clearance  IV access:  Peripheral IV  Procedures and diagnostic studies:    Ct Head Wo Contrast 03/06/2015 No  acute intracranial findings.    Dg Shoulder Right Port 03/05/2015   Negative.   Medical Consultants:  None  Other Consultants:  None  IAnti-Infectives:   None  [name], MD PhD [facility] Pager [phone]  If 7PM-7AM, please contact night-coverage [email or web address] Password [email or web address] 03/07/2015, 7:05 PM   LOS: 2 days   HPI/Subjective: No events overnight. C/o right shoulder pain.  Objective: Filed Vitals:   03/07/15 1131 03/07/15 1300 03/07/15 1525 03/07/15 1835  BP: 121/78   142/101  Pulse:    98  Temp:   98.2 F (36.8 C) 98.5 F (36.9 C)  TempSrc:   Oral Oral  Resp: 19 16  18   Height:      Weight:      SpO2: 92% 93%  95%    Intake/Output Summary (Last 24 hours) at 03/07/15 1905 Last data filed at 03/07/15 1539  Gross per 24 hour  Intake   3600 ml  Output   8000 ml  Net  -4400 ml    Exam:   General:  Pt is alert, follows commands appropriately, not in acute distress  Cardiovascular: Regular rate and rhythm, no rubs, no gallops  Respiratory: Clear to auscultation bilaterally, no wheezing, no crackles, no rhonchi  Abdomen: Soft, non tender, non distended, bowel sounds present, no guarding  Extremities: No edema, pulses DP and PT palpable bilaterally  Neuro: Grossly nonfocal  Data Reviewed: Basic Metabolic Panel:  Recent Labs Lab 03/05/15 1739 03/06/15 0425 03/07/15 0355  NA 136 137 139  K 3.7 3.9 4.3  CL 104 105 106  CO2 21* 22 26  GLUCOSE 151* 135* 110*  BUN 10 6 6   CREATININE 0.93 0.91 0.85  CALCIUM 9.4 8.8* 8.8*  MG  --  2.2 1.9  PHOS  --  3.0 3.3   Liver Function Tests:  Recent Labs Lab 03/05/15 1739 03/06/15 0425 03/07/15 0355  AST 62* 72* 33  ALT 97* 96* 64*  ALKPHOS 94 105 89  BILITOT 0.5 0.4 0.8  PROT 7.3 7.2 6.5  ALBUMIN 4.1 4.0 3.5   CBC:  Recent Labs Lab 03/05/15 1739 03/06/15 0425 03/07/15 0355  WBC 14.5* 16.8* 11.1*  NEUTROABS 10.0*  --   --   HGB 15.6 15.3 14.2  HCT 45.6 44.4 42.6  MCV 89.6 88.8 92.0  PLT 249 240  206   Cardiac Enzymes:  Recent Labs Lab 03/06/15 0425  CKTOTAL 169  CKMB 2.8   BNP: Invalid input(s): POCBNP CBG:  Recent Labs Lab 03/05/15 1715  GLUCAP 146*    Recent Results (from the past 240 hour(s))  MRSA PCR Screening     Status: Abnormal   Collection Time: 03/05/15  9:47 PM  Result Value Ref Range Status   MRSA by PCR INVALID RESULTS, SPECIMEN SENT FOR CULTURE (A) NEGATIVE Final     Scheduled Meds: . Chlorhexidine Gluconate Cloth  6 each Topical Q0600  . famotidine (PEPCID) IV  20 mg Intravenous Q12H  . Influenza vac split quadrivalent PF  0.5 mL Intramuscular Tomorrow-1000  . mupirocin ointment  1 application Nasal BID   Continuous Infusions:

## 2015-03-07 NOTE — Consult Note (Signed)
[place] [facility] Psychiatry Consult   Reason for Consult:  Intentional overdose Referring Physician:  Dr.Xu Patient Identification: [name] MRN:  [id number] Principal Diagnosis: Overdose Diagnosis:   Patient Active Problem List   Diagnosis Date Noted  . Overdose [T50.901A] 03/05/2015  . Seizures ([place]) [R56.9]   . Tachycardia [R00.0]   . Right shoulder pain [M25.511]     Total Time spent with patient: 1 hour  Subjective:   [name] is a 34 y.o. male patient admitted with intentional overdose.  HPI:  [name] is a 34 years old single male admitted to [facility] as a status post intentional overdose and weaknesses seizure. Psychiatric consultation requested for possible suicidal ideations or a suicidal attempt. Patient seen and chart reviewed and case discussed with Dr. Erlinda Hong , psychiatric social service and patient Brother on phone. Patient appeared awake, alert, oriented, calm and cooperative during my evaluation. Patient reported he has been diagnosed with schizophrenia and has been receiving psychiatric outpatient medication management from [facility]. Patient reportedly taken sleeping medication about 20 pills because could not sleep. Patient does not remember the name of the medication and review of his prescribed medication indicated trazodone and Remeron but it is not clear which medication he was overdosed. Patient reportedly stated that he has no intention to end his life or kill himself. Patient has no history of homicidal ideations, intentions or plans. Patient has no known agitation or aggressive behaviors for the last 6 months. Patient Brother reported he left home for about 5 years and was labeled as a homeless before he was able to trace him and bring him home in March 2016. Patient has been staying with the mother and has been in communication with his brother in [place] and also in [place]. Patient has no history of seizure disorder. Patient denies  current symptoms of depression, anxiety, auditory/visual hallucinations, delusions or paranoia. Patient urine drug screen is negative for drug of abuse. Patient has a long history of depression, psychosis, multiple suicidal ideations and attempts mostly 5 years ago.    Past Psychiatric History: He was admitted to behavioral [facility] 2008 for psychosis and agitation and agitation towards his brother.  Risk to Self: Is patient at risk for suicide?: No (denies SI/HI, AH/VH. ) Risk to Others:   Prior Inpatient Therapy:   Prior Outpatient Therapy:    Past Medical History: History reviewed. No pertinent past medical history.  Past Surgical History  Procedure Laterality Date  . Tibia fracture surgery      right  . Appendectomy     Family History: History reviewed. No pertinent family history. Family Psychiatric  History: Unknown  Social History:  History  Alcohol Use  . Yes    Comment: weekly     History  Drug Use  . Yes  . Special: Marijuana    Comment: hx 2015 cocaine    Social History   Social History  . Marital Status: Single    Spouse Name: N/A  . Number of Children: N/A  . Years of Education: N/A   Social History Main Topics  . Smoking status: Current Some Day Smoker    Types: Cigars  . Smokeless tobacco: None  . Alcohol Use: Yes     Comment: weekly  . Drug Use: Yes    Special: Marijuana     Comment: hx 2015 cocaine  . Sexual Activity: Not Asked   Other Topics Concern  . None   Social History Narrative  .  None   Additional Social History:                          Allergies:  No Known Allergies  Labs:  Results for orders placed or performed during the hospital encounter of 03/05/15 (from the past 48 hour(s))  CBG monitoring, ED     Status: Abnormal   Collection Time: 03/05/15  5:15 PM  Result Value Ref Range   Glucose-Capillary 146 (H) 65 - 99 mg/dL  Acetaminophen level     Status: Abnormal   Collection Time: 03/05/15  5:39 PM  Result  Value Ref Range   Acetaminophen (Tylenol), Serum <10 (L) 10 - 30 ug/mL    Comment:        THERAPEUTIC CONCENTRATIONS VARY SIGNIFICANTLY. A RANGE OF 10-30 ug/mL MAY BE AN EFFECTIVE CONCENTRATION FOR MANY PATIENTS. HOWEVER, SOME ARE BEST TREATED AT CONCENTRATIONS OUTSIDE THIS RANGE. ACETAMINOPHEN CONCENTRATIONS >150 ug/mL AT 4 HOURS AFTER INGESTION AND >50 ug/mL AT 12 HOURS AFTER INGESTION ARE OFTEN ASSOCIATED WITH TOXIC REACTIONS.   Salicylate level     Status: None   Collection Time: 03/05/15  5:39 PM  Result Value Ref Range   Salicylate Lvl <8.8 2.8 - 30.0 mg/dL  CBC with Differential/Platelet     Status: Abnormal   Collection Time: 03/05/15  5:39 PM  Result Value Ref Range   WBC 14.5 (H) 4.0 - 10.5 K/uL   RBC 5.09 4.22 - 5.81 MIL/uL   Hemoglobin 15.6 13.0 - 17.0 g/dL   HCT 45.6 39.0 - 52.0 %   MCV 89.6 78.0 - 100.0 fL   MCH 30.6 26.0 - 34.0 pg   MCHC 34.2 30.0 - 36.0 g/dL   RDW 14.1 11.5 - 15.5 %   Platelets 249 150 - 400 K/uL   Neutrophils Relative % 70 %   Neutro Abs 10.0 (H) 1.7 - 7.7 K/uL   Lymphocytes Relative 21 %   Lymphs Abs 3.0 0.7 - 4.0 K/uL   Monocytes Relative 8 %   Monocytes Absolute 1.2 (H) 0.1 - 1.0 K/uL   Eosinophils Relative 1 %   Eosinophils Absolute 0.2 0.0 - 0.7 K/uL   Basophils Relative 0 %   Basophils Absolute 0.1 0.0 - 0.1 K/uL  Comprehensive metabolic panel     Status: Abnormal   Collection Time: 03/05/15  5:39 PM  Result Value Ref Range   Sodium 136 135 - 145 mmol/L   Potassium 3.7 3.5 - 5.1 mmol/L   Chloride 104 101 - 111 mmol/L   CO2 21 (L) 22 - 32 mmol/L   Glucose, Bld 151 (H) 65 - 99 mg/dL   BUN 10 6 - 20 mg/dL   Creatinine, Ser 0.93 0.61 - 1.24 mg/dL   Calcium 9.4 8.9 - 10.3 mg/dL   Total Protein 7.3 6.5 - 8.1 g/dL   Albumin 4.1 3.5 - 5.0 g/dL   AST 62 (H) 15 - 41 U/L   ALT 97 (H) 17 - 63 U/L   Alkaline Phosphatase 94 38 - 126 U/L   Total Bilirubin 0.5 0.3 - 1.2 mg/dL   GFR calc non Af Amer >60 >60 mL/min   GFR calc Af Amer  >60 >60 mL/min    Comment: (NOTE) The eGFR has been calculated using the CKD EPI equation. This calculation has not been validated in all clinical situations. eGFR's persistently <60 mL/min signify possible Chronic Kidney Disease.    Anion gap 11 5 - 15  Ethanol  Status: None   Collection Time: 03/05/15  5:39 PM  Result Value Ref Range   Alcohol, Ethyl (B) <5 <5 mg/dL    Comment:        LOWEST DETECTABLE LIMIT FOR SERUM ALCOHOL IS 5 mg/dL FOR MEDICAL PURPOSES ONLY   MRSA PCR Screening     Status: Abnormal   Collection Time: 03/05/15  9:47 PM  Result Value Ref Range   MRSA by PCR INVALID RESULTS, SPECIMEN SENT FOR CULTURE (A) NEGATIVE    Comment:        The GeneXpert MRSA Assay (FDA approved for NASAL specimens only), is one component of a comprehensive MRSA colonization surveillance program. It is not intended to diagnose MRSA infection nor to guide or monitor treatment for MRSA infections. Mindi Slicker RN [id number] 03/06/15 A [facility]   Urinalysis, Routine w reflex microscopic (not at [facility])     Status: None   Collection Time: 03/05/15 10:39 PM  Result Value Ref Range   Color, Urine YELLOW YELLOW   APPearance CLEAR CLEAR   Specific Gravity, Urine 1.012 1.005 - 1.030   pH 5.5 5.0 - 8.0   Glucose, UA NEGATIVE NEGATIVE mg/dL   Hgb urine dipstick NEGATIVE NEGATIVE   Bilirubin Urine NEGATIVE NEGATIVE   Ketones, ur NEGATIVE NEGATIVE mg/dL   Protein, ur NEGATIVE NEGATIVE mg/dL   Nitrite NEGATIVE NEGATIVE   Leukocytes, UA NEGATIVE NEGATIVE    Comment: MICROSCOPIC NOT DONE ON URINES WITH NEGATIVE PROTEIN, BLOOD, LEUKOCYTES, NITRITE, OR GLUCOSE <1000 mg/dL.  Urine rapid drug screen (hosp performed)     Status: None   Collection Time: 03/05/15 10:39 PM  Result Value Ref Range   Opiates NONE DETECTED NONE DETECTED   Cocaine NONE DETECTED NONE DETECTED   Benzodiazepines NONE DETECTED NONE DETECTED   Amphetamines NONE DETECTED NONE DETECTED   Tetrahydrocannabinol NONE DETECTED  NONE DETECTED   Barbiturates NONE DETECTED NONE DETECTED    Comment:        DRUG SCREEN FOR MEDICAL PURPOSES ONLY.  IF CONFIRMATION IS NEEDED FOR ANY PURPOSE, NOTIFY LAB WITHIN 5 DAYS.        LOWEST DETECTABLE LIMITS FOR URINE DRUG SCREEN Drug Class       Cutoff (ng/mL) Amphetamine      1000 Barbiturate      200 Benzodiazepine   808 Tricyclics       811 Opiates          300 Cocaine          300 THC              50   CBC     Status: Abnormal   Collection Time: 03/06/15  4:25 AM  Result Value Ref Range   WBC 16.8 (H) 4.0 - 10.5 K/uL   RBC 5.00 4.22 - 5.81 MIL/uL   Hemoglobin 15.3 13.0 - 17.0 g/dL   HCT 44.4 39.0 - 52.0 %   MCV 88.8 78.0 - 100.0 fL   MCH 30.6 26.0 - 34.0 pg   MCHC 34.5 30.0 - 36.0 g/dL   RDW 14.5 11.5 - 15.5 %   Platelets 240 150 - 400 K/uL  Basic metabolic panel     Status: Abnormal   Collection Time: 03/06/15  4:25 AM  Result Value Ref Range   Sodium 137 135 - 145 mmol/L   Potassium 3.9 3.5 - 5.1 mmol/L   Chloride 105 101 - 111 mmol/L   CO2 22 22 - 32 mmol/L   Glucose, Bld 135 (H)  65 - 99 mg/dL   BUN 6 6 - 20 mg/dL   Creatinine, Ser 0.91 0.61 - 1.24 mg/dL   Calcium 8.8 (L) 8.9 - 10.3 mg/dL   GFR calc non Af Amer >60 >60 mL/min   GFR calc Af Amer >60 >60 mL/min    Comment: (NOTE) The eGFR has been calculated using the CKD EPI equation. This calculation has not been validated in all clinical situations. eGFR's persistently <60 mL/min signify possible Chronic Kidney Disease.    Anion gap 10 5 - 15  Magnesium     Status: None   Collection Time: 03/06/15  4:25 AM  Result Value Ref Range   Magnesium 2.2 1.7 - 2.4 mg/dL  Phosphorus     Status: None   Collection Time: 03/06/15  4:25 AM  Result Value Ref Range   Phosphorus 3.0 2.5 - 4.6 mg/dL  Lactic acid, plasma     Status: Abnormal   Collection Time: 03/06/15  4:25 AM  Result Value Ref Range   Lactic Acid, Venous 3.0 (HH) 0.5 - 2.0 mmol/L    Comment: CRITICAL RESULT CALLED TO, READ BACK BY AND  VERIFIED WITHRiki Rusk RN [phone] 03/06/15 A [facility]   Acetaminophen level     Status: Abnormal   Collection Time: 03/06/15  4:25 AM  Result Value Ref Range   Acetaminophen (Tylenol), Serum <10 (L) 10 - 30 ug/mL    Comment:        THERAPEUTIC CONCENTRATIONS VARY SIGNIFICANTLY. A RANGE OF 10-30 ug/mL MAY BE AN EFFECTIVE CONCENTRATION FOR MANY PATIENTS. HOWEVER, SOME ARE BEST TREATED AT CONCENTRATIONS OUTSIDE THIS RANGE. ACETAMINOPHEN CONCENTRATIONS >150 ug/mL AT 4 HOURS AFTER INGESTION AND >50 ug/mL AT 12 HOURS AFTER INGESTION ARE OFTEN ASSOCIATED WITH TOXIC REACTIONS.   Salicylate level     Status: None   Collection Time: 03/06/15  4:25 AM  Result Value Ref Range   Salicylate Lvl <7.3 2.8 - 30.0 mg/dL  Hepatic function panel     Status: Abnormal   Collection Time: 03/06/15  4:25 AM  Result Value Ref Range   Total Protein 7.2 6.5 - 8.1 g/dL   Albumin 4.0 3.5 - 5.0 g/dL   AST 72 (H) 15 - 41 U/L   ALT 96 (H) 17 - 63 U/L   Alkaline Phosphatase 105 38 - 126 U/L   Total Bilirubin 0.4 0.3 - 1.2 mg/dL   Bilirubin, Direct 0.1 0.1 - 0.5 mg/dL   Indirect Bilirubin 0.3 0.3 - 0.9 mg/dL  CK total and CKMB (cardiac)not at [facility]     Status: None   Collection Time: 03/06/15  4:25 AM  Result Value Ref Range   Total CK 169 49 - 397 U/L   CK, MB 2.8 0.5 - 5.0 ng/mL   Relative Index 1.7 0.0 - 2.5    Comment: Performed at [facility]  Comprehensive metabolic panel     Status: Abnormal   Collection Time: 03/07/15  3:55 AM  Result Value Ref Range   Sodium 139 135 - 145 mmol/L   Potassium 4.3 3.5 - 5.1 mmol/L   Chloride 106 101 - 111 mmol/L   CO2 26 22 - 32 mmol/L   Glucose, Bld 110 (H) 65 - 99 mg/dL   BUN 6 6 - 20 mg/dL   Creatinine, Ser 0.85 0.61 - 1.24 mg/dL   Calcium 8.8 (L) 8.9 - 10.3 mg/dL   Total Protein 6.5 6.5 - 8.1 g/dL   Albumin 3.5 3.5 - 5.0 g/dL   AST  33 15 - 41 U/L   ALT 64 (H) 17 - 63 U/L   Alkaline Phosphatase 89 38 - 126 U/L   Total Bilirubin 0.8 0.3 - 1.2  mg/dL   GFR calc non Af Amer >60 >60 mL/min   GFR calc Af Amer >60 >60 mL/min    Comment: (NOTE) The eGFR has been calculated using the CKD EPI equation. This calculation has not been validated in all clinical situations. eGFR's persistently <60 mL/min signify possible Chronic Kidney Disease.    Anion gap 7 5 - 15  CBC     Status: Abnormal   Collection Time: 03/07/15  3:55 AM  Result Value Ref Range   WBC 11.1 (H) 4.0 - 10.5 K/uL   RBC 4.63 4.22 - 5.81 MIL/uL   Hemoglobin 14.2 13.0 - 17.0 g/dL   HCT 42.6 39.0 - 52.0 %   MCV 92.0 78.0 - 100.0 fL   MCH 30.7 26.0 - 34.0 pg   MCHC 33.3 30.0 - 36.0 g/dL   RDW 15.0 11.5 - 15.5 %   Platelets 206 150 - 400 K/uL  Magnesium     Status: None   Collection Time: 03/07/15  3:55 AM  Result Value Ref Range   Magnesium 1.9 1.7 - 2.4 mg/dL  Phosphorus     Status: None   Collection Time: 03/07/15  3:55 AM  Result Value Ref Range   Phosphorus 3.3 2.5 - 4.6 mg/dL    Current Facility-Administered Medications  Medication Dose Route Frequency Provider Last Rate Last Dose  . 0.9 %  sodium chloride infusion  250 mL Intravenous PRN Corey Harold, NP      . 0.9 %  sodium chloride infusion   Intravenous Continuous Corey Harold, NP 125 mL/hr at 03/07/15 1016    . Chlorhexidine Gluconate Cloth 2 % PADS 6 each  6 each Topical Q0600 Brand Males, MD   6 each at 03/07/15 1049  . famotidine (PEPCID) IVPB 20 mg premix  20 mg Intravenous Q12H Corey Harold, NP   20 mg at 03/07/15 1035  . Influenza vac split quadrivalent PF (FLUARIX) injection 0.5 mL  0.5 mL Intramuscular Tomorrow-1000 Brand Males, MD   0.5 mL at 03/06/15 1000  . LORazepam (ATIVAN) injection 1-2 mg  1-2 mg Intravenous Q2H PRN Corey Harold, NP      . mupirocin ointment (BACTROBAN) 2 % 1 application  1 application Nasal BID Brand Males, MD   1 application at 97/74/14 1034  . oxyCODONE-acetaminophen (PERCOCET/ROXICET) 5-325 MG per tablet 1-2 tablet  1-2 tablet Oral Q4H PRN Theodis Blaze, MD   2 tablet at 03/07/15 0551    Musculoskeletal: Strength & Muscle Tone: within normal limits Gait & Station: normal, unable to stand Patient leans: N/A  Psychiatric Specialty Exam: ROS patient denied nausea, vomiting, abdominal discomfort, shortness of breath and chest pain No Fever-chills, No Headache, No changes with Vision or hearing, reports vertigo No problems swallowing food or Liquids, No Chest pain, Cough or Shortness of Breath, No Abdominal pain, No Nausea or Vommitting, Bowel movements are regular, No Blood in stool or Urine, No dysuria, No new skin rashes or bruises, No new joints pains-aches,  No new weakness, tingling, numbness in any extremity, No recent weight gain or loss, No polyuria, polydypsia or polyphagia,   A full 10 point Review of Systems was done, except as stated above, all other Review of Systems were negative.  Blood pressure 121/78, pulse 89, temperature 97.7 F (36.5  C), temperature source Oral, resp. rate 19, height '5\' 9"'  (1.753 m), weight 90.9 kg (200 lb 6.4 oz), SpO2 92 %.Body mass index is 29.58 kg/(m^2).  General Appearance: Casual  Eye Contact::  Good  Speech:  Clear and Coherent  Volume:  Decreased  Mood:  Depressed  Affect:  Appropriate and Congruent  Thought Process:  Coherent and Goal Directed  Orientation:  Full (Time, Place, and Person)  Thought Content:  WDL  Suicidal Thoughts:  No  Homicidal Thoughts:  No  Memory:  Immediate;   Fair Recent;   Poor  Judgement:  Impaired  Insight:  Fair  Psychomotor Activity:  Normal  Concentration:  Fair  Recall:  Poor  Fund of Knowledge:Fair  Language: Good  Akathisia:  Negative  Handed:  Right  AIMS (if indicated):     Assets:  Communication Skills Desire for Improvement Financial Resources/Insurance Housing Leisure Time Resilience Social Support  ADL's:  Intact  Cognition: Impaired,  Mild  Sleep:      Treatment Plan Summary: Daily contact with patient to assess  and evaluate symptoms and progress in treatment and Medication management  Patient has no safety concerns during this hospitalization and contract for safety Continue home medications and conform the home medication from the [place] a neurological evaluation secondary to recent witnessed seizure  Disposition: Patient does not meet criteria for psychiatric inpatient admission. Supportive therapy provided about ongoing stressors.  Sahira Cataldi,JANARDHAHA R. 03/07/2015 11:33 AM

## 2015-03-07 NOTE — Plan of Care (Signed)
Problem: Pain Managment: Goal: General experience of comfort will improve Outcome: Progressing Patient complains of right shoulder pain and has some relief from oral pain pills.

## 2015-03-08 ENCOUNTER — Inpatient Hospital Stay (HOSPITAL_COMMUNITY): Payer: Medicaid Other

## 2015-03-08 DIAGNOSIS — M79601 Pain in right arm: Secondary | ICD-10-CM

## 2015-03-08 DIAGNOSIS — T50901A Poisoning by unspecified drugs, medicaments and biological substances, accidental (unintentional), initial encounter: Secondary | ICD-10-CM

## 2015-03-08 DIAGNOSIS — E872 Acidosis: Secondary | ICD-10-CM

## 2015-03-08 LAB — CBC
HEMATOCRIT: 44.4 % (ref 39.0–52.0)
Hemoglobin: 14.9 g/dL (ref 13.0–17.0)
MCH: 30.2 pg (ref 26.0–34.0)
MCHC: 33.6 g/dL (ref 30.0–36.0)
MCV: 90.1 fL (ref 78.0–100.0)
Platelets: 204 10*3/uL (ref 150–400)
RBC: 4.93 MIL/uL (ref 4.22–5.81)
RDW: 14.7 % (ref 11.5–15.5)
WBC: 8.2 10*3/uL (ref 4.0–10.5)

## 2015-03-08 LAB — MRSA CULTURE

## 2015-03-08 LAB — MAGNESIUM: Magnesium: 1.9 mg/dL (ref 1.7–2.4)

## 2015-03-08 LAB — COMPREHENSIVE METABOLIC PANEL
ALT: 62 U/L (ref 17–63)
ANION GAP: 11 (ref 5–15)
AST: 36 U/L (ref 15–41)
Albumin: 3.9 g/dL (ref 3.5–5.0)
Alkaline Phosphatase: 104 U/L (ref 38–126)
BILIRUBIN TOTAL: 0.7 mg/dL (ref 0.3–1.2)
BUN: 8 mg/dL (ref 6–20)
CALCIUM: 9.5 mg/dL (ref 8.9–10.3)
CO2: 27 mmol/L (ref 22–32)
Chloride: 100 mmol/L — ABNORMAL LOW (ref 101–111)
Creatinine, Ser: 0.99 mg/dL (ref 0.61–1.24)
Glucose, Bld: 120 mg/dL — ABNORMAL HIGH (ref 65–99)
POTASSIUM: 3.9 mmol/L (ref 3.5–5.1)
Sodium: 138 mmol/L (ref 135–145)
TOTAL PROTEIN: 7.3 g/dL (ref 6.5–8.1)

## 2015-03-08 LAB — PHOSPHORUS: Phosphorus: 4.1 mg/dL (ref 2.5–4.6)

## 2015-03-08 LAB — LACTIC ACID, PLASMA: Lactic Acid, Venous: 1.1 mmol/L (ref 0.5–2.0)

## 2015-03-08 LAB — HIV ANTIBODY (ROUTINE TESTING W REFLEX): HIV Screen 4th Generation wRfx: NONREACTIVE

## 2015-03-08 MED ORDER — INFLUENZA VAC SPLIT QUAD 0.5 ML IM SUSY
0.5000 mL | PREFILLED_SYRINGE | INTRAMUSCULAR | Status: DC
  Filled 2015-03-08: qty 0.5

## 2015-03-08 MED ORDER — INFLUENZA VAC SPLIT QUAD 0.5 ML IM SUSY: 0.5000 mL | PREFILLED_SYRINGE | INTRAMUSCULAR | Status: DC

## 2015-03-08 MED ORDER — FAMOTIDINE 20 MG PO TABS
20.0000 mg | ORAL_TABLET | Freq: Two times a day (BID) | ORAL | Status: DC
  Filled 2015-03-08: qty 1

## 2015-03-08 NOTE — Consult Note (Signed)
[facility] Consult   Reason for Consult:  Intentional overdose Referring Physician:  Dr.Xu Patient Identification: [name] MRN:  [id number] Principal Diagnosis: Overdose Diagnosis:   Patient Active Problem List   Diagnosis Date Noted  . Overdose [T50.901A] 03/05/2015  . Seizures ([place]) [R56.9]   . Tachycardia [R00.0]   . Right shoulder pain [M25.511]     Total Time spent with patient: 30 minutes  Subjective:   [name] is a 34 y.o. male patient admitted with intentional overdose.  HPI:  [name] is a 34 years old single male, admitted to [facility] as a status post intentional overdose and witnessed seizure in ER. Psychiatric consultation requested for possible suicidal ideations or a suicidal attempt. Patient seen and chart reviewed and case discussed with Dr. Erlinda Hong , psychiatric social service and patient brother on phone, who came from [place]. Patient appeared awake, alert, oriented, calm and cooperative during my evaluation. Patient reported he has been diagnosed with schizophrenia and has been receiving psychiatric outpatient medication management from [facility] including depot antipsychotic medication. Patient reportedly taken sleeping medication about 20 pills because could not sleep and denied intent to end his life.Patient does not remember the name of the medication and review of his prescribed medication indicated trazodone and Remeron for sleep but it is not clear which medication he was overdosed. I suspect it may be wellbutrin based on his new onset seizures. Patient has no history of homicidal ideations, intentions or plans. Patient has no known agitation or aggressive behaviors for the last 6 months. Patient brother reported he left home for about 5 years and was homeless before family was able to trace him and bring him home in March 2016. Patient has been staying with the mother and has been in communication with his brother in  [place] and also in [place].Patient denies current symptoms of depression, anxiety, auditory/visual hallucinations, delusions or paranoia. Patient urine drug screen is negative for drug of abuse.    Past Psychiatric History: He was admitted to behavioral [facility] 2008 for psychosis and agitation and agitation towards his brother. Patient has a long history of depression, psychosis, multiple suicidal ideations and attempts mostly 5 years ago.   Interval History: Patient has been stable without behavioral or emotional problems. He has been complaint with medications and staff. His family is supportive. He has denied suicide or homicide ideation, intention or plans. He contract for safety and willing to follow up with [facility] for out patient treatment.  Risk to Self: Is patient at risk for suicide?: No (denies SI/HI, AH/VH. ) Risk to Others:   Prior Inpatient Therapy:   Prior Outpatient Therapy:    Past Medical History: History reviewed. No pertinent past medical history.  Past Surgical History  Procedure Laterality Date  . Tibia fracture surgery      right  . Appendectomy     Family History: History reviewed. No pertinent family history. Family Psychiatric  History: Unknown  Social History:  History  Alcohol Use  . Yes    Comment: weekly     History  Drug Use  . Yes  . Special: Marijuana    Comment: hx 2015 cocaine    Social History   Social History  . Marital Status: Single    Spouse Name: N/A  . Number of Children: N/A  . Years of Education: N/A   Social History Main Topics  . Smoking status: Current Some Day Smoker    Types: Cigars  .  Smokeless tobacco: None  . Alcohol Use: Yes     Comment: weekly  . Drug Use: Yes    Special: Marijuana     Comment: hx 2015 cocaine  . Sexual Activity: Not Asked   Other Topics Concern  . None   Social History Narrative  . None   Additional Social History:                          Allergies:  No Known  Allergies  Labs:  Results for orders placed or performed during the hospital encounter of 03/05/15 (from the past 48 hour(s))  Comprehensive metabolic panel     Status: Abnormal   Collection Time: 03/07/15  3:55 AM  Result Value Ref Range   Sodium 139 135 - 145 mmol/L   Potassium 4.3 3.5 - 5.1 mmol/L   Chloride 106 101 - 111 mmol/L   CO2 26 22 - 32 mmol/L   Glucose, Bld 110 (H) 65 - 99 mg/dL   BUN 6 6 - 20 mg/dL   Creatinine, Ser 0.85 0.61 - 1.24 mg/dL   Calcium 8.8 (L) 8.9 - 10.3 mg/dL   Total Protein 6.5 6.5 - 8.1 g/dL   Albumin 3.5 3.5 - 5.0 g/dL   AST 33 15 - 41 U/L   ALT 64 (H) 17 - 63 U/L   Alkaline Phosphatase 89 38 - 126 U/L   Total Bilirubin 0.8 0.3 - 1.2 mg/dL   GFR calc non Af Amer >60 >60 mL/min   GFR calc Af Amer >60 >60 mL/min    Comment: (NOTE) The eGFR has been calculated using the CKD EPI equation. This calculation has not been validated in all clinical situations. eGFR's persistently <60 mL/min signify possible Chronic Kidney Disease.    Anion gap 7 5 - 15  CBC     Status: Abnormal   Collection Time: 03/07/15  3:55 AM  Result Value Ref Range   WBC 11.1 (H) 4.0 - 10.5 K/uL   RBC 4.63 4.22 - 5.81 MIL/uL   Hemoglobin 14.2 13.0 - 17.0 g/dL   HCT 42.6 39.0 - 52.0 %   MCV 92.0 78.0 - 100.0 fL   MCH 30.7 26.0 - 34.0 pg   MCHC 33.3 30.0 - 36.0 g/dL   RDW 15.0 11.5 - 15.5 %   Platelets 206 150 - 400 K/uL  Magnesium     Status: None   Collection Time: 03/07/15  3:55 AM  Result Value Ref Range   Magnesium 1.9 1.7 - 2.4 mg/dL  Phosphorus     Status: None   Collection Time: 03/07/15  3:55 AM  Result Value Ref Range   Phosphorus 3.3 2.5 - 4.6 mg/dL  Magnesium     Status: None   Collection Time: 03/08/15  5:45 AM  Result Value Ref Range   Magnesium 1.9 1.7 - 2.4 mg/dL  Phosphorus     Status: None   Collection Time: 03/08/15  5:45 AM  Result Value Ref Range   Phosphorus 4.1 2.5 - 4.6 mg/dL  CBC     Status: None   Collection Time: 03/08/15  5:45 AM  Result  Value Ref Range   WBC 8.2 4.0 - 10.5 K/uL   RBC 4.93 4.22 - 5.81 MIL/uL   Hemoglobin 14.9 13.0 - 17.0 g/dL   HCT 44.4 39.0 - 52.0 %   MCV 90.1 78.0 - 100.0 fL   MCH 30.2 26.0 - 34.0 pg   MCHC 33.6 30.0 -  36.0 g/dL   RDW 14.7 11.5 - 15.5 %   Platelets 204 150 - 400 K/uL  Comprehensive metabolic panel     Status: Abnormal   Collection Time: 03/08/15  5:45 AM  Result Value Ref Range   Sodium 138 135 - 145 mmol/L   Potassium 3.9 3.5 - 5.1 mmol/L   Chloride 100 (L) 101 - 111 mmol/L   CO2 27 22 - 32 mmol/L   Glucose, Bld 120 (H) 65 - 99 mg/dL   BUN 8 6 - 20 mg/dL   Creatinine, Ser 0.99 0.61 - 1.24 mg/dL   Calcium 9.5 8.9 - 10.3 mg/dL   Total Protein 7.3 6.5 - 8.1 g/dL   Albumin 3.9 3.5 - 5.0 g/dL   AST 36 15 - 41 U/L   ALT 62 17 - 63 U/L   Alkaline Phosphatase 104 38 - 126 U/L   Total Bilirubin 0.7 0.3 - 1.2 mg/dL   GFR calc non Af Amer >60 >60 mL/min   GFR calc Af Amer >60 >60 mL/min    Comment: (NOTE) The eGFR has been calculated using the CKD EPI equation. This calculation has not been validated in all clinical situations. eGFR's persistently <60 mL/min signify possible Chronic Kidney Disease.    Anion gap 11 5 - 15  Lactic acid, plasma     Status: None   Collection Time: 03/08/15  5:45 AM  Result Value Ref Range   Lactic Acid, Venous 1.1 0.5 - 2.0 mmol/L    Current Facility-Administered Medications  Medication Dose Route Frequency Provider Last Rate Last Dose  . Chlorhexidine Gluconate Cloth 2 % PADS 6 each  6 each Topical Q0600 Brand Males, MD   6 each at 03/08/15 0941  . cyclobenzaprine (FLEXERIL) tablet 5 mg  5 mg Oral TID PRN Florencia Reasons, MD   5 mg at 03/08/15 0941  . famotidine (PEPCID) IVPB 20 mg premix  20 mg Intravenous Q12H Corey Harold, NP   20 mg at 03/08/15 0941  . Influenza vac split quadrivalent PF (FLUARIX) injection 0.5 mL  0.5 mL Intramuscular Tomorrow-1000 Brand Males, MD   0.5 mL at 03/06/15 1000  . LORazepam (ATIVAN) injection 1-2 mg  1-2 mg  Intravenous Q2H PRN Corey Harold, NP      . mupirocin ointment (BACTROBAN) 2 % 1 application  1 application Nasal BID Brand Males, MD   1 application at 88/82/80 0941  . oxyCODONE-acetaminophen (PERCOCET/ROXICET) 5-325 MG per tablet 1-2 tablet  1-2 tablet Oral Q4H PRN Theodis Blaze, MD   2 tablet at 03/08/15 0941    Musculoskeletal: Strength & Muscle Tone: within normal limits Gait & Station: normal, unable to stand Patient leans: N/A  Psychiatric Specialty Exam: ROS   Blood pressure 142/69, pulse 84, temperature 98.2 F (36.8 C), temperature source Oral, resp. rate 18, height '5\' 9"'  (1.753 m), weight 93.2 kg (205 lb 7.5 oz), SpO2 94 %.Body mass index is 30.33 kg/(m^2).  General Appearance: Casual  Eye Contact::  Good  Speech:  Clear and Coherent  Volume:  Decreased  Mood:  Depressed  Affect:  Appropriate and Congruent  Thought Process:  Coherent and Goal Directed  Orientation:  Full (Time, Place, and Person)  Thought Content:  WDL  Suicidal Thoughts:  No  Homicidal Thoughts:  No  Memory:  Immediate;   Fair Recent;   Poor  Judgement:  Impaired  Insight:  Fair  Psychomotor Activity:  Normal  Concentration:  Fair  Recall:  Poor  [employer]  of Knowledge:Fair  Language: Good  Akathisia:  Negative  Handed:  Right  AIMS (if indicated):     Assets:  Communication Skills Desire for Improvement Financial Resources/Insurance Housing Leisure Time Resilience Social Support  ADL's:  Intact  Cognition: Impaired,  Mild  Sleep:      Treatment Plan Summary: Daily contact with patient to assess and evaluate symptoms and progress in treatment and Medication management  Patient has no safety concerns during this hospitalization and contract for safety Continue home medications, confirm home medication from the [facility] wellbutrin to avoid further seizure episodes Reportedly trazodone and remeron has been discontinued by primary psychiatry as per communication with  social service Psychiatrically cleared for out patient treatment  Disposition: Patient does not meet criteria for psychiatric inpatient admission. Supportive therapy provided about ongoing stressors.  Shatyra Becka,JANARDHAHA R. 03/08/2015 10:54 AM

## 2015-03-08 NOTE — Progress Notes (Signed)
VASCULAR LAB PRELIMINARY  PRELIMINARY  PRELIMINARY  PRELIMINARY  Right upper extremity venous duplex completed.    Preliminary report:  Right:  No evidence of DVT or superficial thrombosis.    Zaakirah Kistner, RVT 03/08/2015, 9:02 AM

## 2015-03-08 NOTE — Clinical Social Work Psych Assess (Signed)
Clinical Social Work Nature conservation officer  Clinical Social Worker:  [name] Date/Time:  03/08/2015, 10:41 AM Referred By:  Physician Date Referred:  03/06/15 Reason for Referral:  Behavioral Health Issues, Psychosocial Assessment   Presenting Symptoms/Problems  Presenting Symptoms/Problems(in person's/family's own words): Pt reported to CSW that the he "was trying to sleep and took some pills." Pt stated that he took a small handful of medication. Pt showed CSW the amount using his hand (approximately a 50 cent size). Pt stated the medication, (did not help me sleep.) Pt also stated he, "has not been sleeping well." Pt does report nightly sleep, however poor in quality.  Abuse/Neglect/Trauma History  Abuse/Neglect/Trauma History:  Denies History Abuse/Neglect/Trauma History Comments (indicate dates): NO History   Psychiatric History  Psychiatric History:  Inpatient/Hospitalization, Outpatient Treatment Psychiatric Medication:         Wellbutrin XL 150mg  at bedtime;  paliperidone (INVEGA SUSTENNA) 156 MG/ML SUSP injection Inject 156 mg into the muscle every 30 (thirty) days.   hydrOXYzine (ATARAX/VISTARIL) 50 MG tablet Take 50 mg by mouth at bedtime    Current Mental Health Hospitalizations/Previous Mental Health History:  Pt stated he was hospitalized in April 2016 both at [facility] in [place] and [facility]. Pt stated he was "hearing voices" at that time.    Current Provider:   Place and Date: [facility] [phone]  Current Medications: See Above   Previous Inpatient Admission/Date/Reason:  Pt stated he was hospitalized in April 2016 both at [facility] in [place] and [facility]. Pt stated he was "hearing voices" at that time.    Emotional Health/Current Symptoms  Suicide/Self Harm: None Reported Suicide Attempt in Past (date/description): Pt reported prior suicide attempts, however none in the past five years.  Other Harmful Behavior (ex. homicidal ideation)  (describe): Pt claimed no SI/HI at this time.   Psychotic/Dissociative Symptoms  Psychotic/Dissociative Symptoms: None Reported Other Psychotic/Dissociative Symptoms:  Pt has experienced psychotic features in the past, however currently the Pt is complying to his medicine regimen.   Attention/Behavioral Symptoms  Attention/Behavioral Symptoms: Within Normal Limits Other Attention/Behavioral Symptoms:  None noted   Cognitive Impairment  Cognitive Impairment:  Within Normal Limits Other Cognitive Impairment:   Mood and Adjustment  Mood and Adjustment:  Flat   Stress, Anxiety, Trauma, Any Recent Loss/Stressor  Stress, Anxiety, Trauma, Any Recent Loss/Stressor: None Reported Anxiety (frequency): Only in social Settings.   Phobia (specify):  None Reported Compulsive Behavior (specify):  None Reported   Obsessive Behavior (specify): None noted or reported  Other Stress, Anxiety, Trauma, Any Recent Loss/Stressor:  Pt stated he sometimes "gets anxious around people, of there is a lot."    Substance Abuse/Use  Substance Abuse/Use: Current Substance Use, History of Substance Use SBIRT Completed (please refer for detailed history): Yes Self-reported Substance Use (last use and frequency):  Pt stated that he "smokes maybe a joint a month." Pt also stated that he drinks " Two 40 ounce Beers per month.   Urinary Drug Screen Completed: Yes Alcohol Level:  >5   Environment/Housing/Living Arrangement  Environmental/Housing/Living Arrangement: Stable Housing, With Family Member Who is in the Home: Pt lives at home with his mother [name]  [phone]). Pt stated Pt's mother does not speak [place].  Emergency Contact:     Financial  Financial: Medicaid   Patient's Strengths and Goals  Patient's Strengths and Goals (patient's own words): Pt has a understanding of outpatient treatment facility and sees a counselor at [facility].   Clinical Social Worker's Armed forces logistics/support/administrative officer  Clinical Social Workers Interpretive Summary: Pt was sitting up in bed at the time of assessment. Pt was agreeable to CSW meeting for completing assessment. Pt is able to communicate with CSW using english. Pt does have a delayment when answering CSW questions. CSW was able to explain information using understandable communication. CSW questioned Pt's reason for this admission. Pt stated that he "was at home and I needed to sleep so I took some extra medication. Pt stated that he took one of his sleep medications. Pt did not know which medication it was that he ingested, however stated that the medication did not make him sleep. Pt stated that the "hospital told me I had a seizure." Pt denies memory of a seizure. Pt denied SI/HI at the time of ingestion. Pt also denied having any anxiety or panic attack at that time. Pt restated "I just wanted to sleep." Pt described the amount of medication taken by drawing a circle on his hand. CSW witnessed the size demonstrated by the Pt to be about the size of a 50 cent piece. Pt denied using any marijuana or alcohol at the time of the ingestion. Pt was able to describe past mental health Hx and also stated that he recently moved back in with his mother. Pt did report being admitted in April 2016 at both [facility] and [facility] in [place] for hearing voices. Pt's brother located Pt and brought him back to live with their mother. Pt has been compliant, up until this point, with taking his medication.   Disposition  Disposition: Outpatient Referral Made/Needed

## 2015-03-08 NOTE — Discharge Summary (Signed)
Discharge Summary  [name] [id number] DOB: 1980-07-14  PCP: No primary care provider on file.  Admit date: 03/05/2015 Discharge date: 03/08/2015  Time spent: >106mins  Recommendations for Outpatient Follow-up:  1. Recommend to establish care with a  PMD, consider outpatient sleep study 2. F/u with psychiatry at [facility], patient is instructed to d/c wellbutrin.  Discharge Diagnoses:  Active Hospital Problems   Diagnosis Date Noted  . Overdose 03/05/2015    Resolved Hospital Problems   Diagnosis Date Noted Date Resolved  No resolved problems to display.    Discharge Condition: stable  Diet recommendation: regular diet  Filed Weights   03/06/15 0432 03/07/15 0500 03/08/15 0500  Weight: 199 lb 1.2 oz (90.3 kg) 200 lb 6.4 oz (90.9 kg) 205 lb 7.5 oz (93.2 kg)    History of present illness:  34 year old male with history of prior suicide attempts due by overdose and polysubstance abuse. Presented to ED 11/29 after intentional overdose of unknown substances. He reported he took prescription medications. Reports taking over 20 pills that are prescribed to him. He claimed there was no intent of self-harm, and he was only doing this to be able to sleep well.  EMS was called and he was brought to [facility] ED where he stated he has used cocaine in the remote past and continues to have intermittent marijuana abuse. He did have one additional seizure in ED which responded to ativan, he was also administered magnesium.  Hospital Course:  Principal Problem:   Overdose  Intentional overdose of unknown prescription drug (likely Wellbutrin , per communication with [facility], patient was recently started on wellbutrin) - clinically stable  - psych consulted, cleared him  To be discharged home with outpatient psych follow up.  Seizures  - secondary to wellbutrin overdose, wellbutrin discontinued. - seizure free since admitted - EEG unremarkable  Tachycardia - Borderline QTC  prolongation QTC 485 on admission - Telemetry no arrhythmia, repeat ekg QTc normalized, d/c tele. - pt denies chest pian or shortness of breath , resolved  Leukocytosis  - Wbc 14.5 on admission, no fever, does has elevated lactic acid, suspect from seizure. ua unremarkble. - CXR ? Interstitial changes, but no cough, no sob -leukocytosis resolved  Lactic acidosis: likely from seizure, resolved.  R shoulder pain - Xray negative, likely resulted from seizure episode - OK to use analgesia as needed -better  Transaminitis - in the pattern of alcohol use  - hepatitis panel pending , abdominal US unremarkable -lft normalized at discharge    Code Status: Full.  Family Communication: plan of care discussed with the patient Disposition Plan: psych cleared patient to be discharged home on 12/2  Procedures:  EEG, unremarkable  Consultations:  Psychiatry  Admitted to critical care initially  Discharge Exam: BP 129/77 mmHg  Pulse 79  Temp(Src) 98.1 F (36.7 C) (Oral)  Resp 19  Ht 5\' 9"  (1.753 m)  Wt 205 lb 7.5 oz (93.2 kg)  BMI 30.33 kg/m2  SpO2 95%   General: Pt is alert, follows commands appropriately, not in acute distress  Cardiovascular: Regular rate and rhythm, no rubs, no gallops  Respiratory: Clear to auscultation bilaterally, no wheezing, no crackles, no rhonchi  Abdomen: Soft, non tender, non distended, bowel sounds present, no guarding  Extremities: No edema, pulses DP and PT palpable bilaterally  Neuro: Grossly nonfocal   Discharge Instructions You were cared for by a hospitalist during your hospital stay. If you have any questions about your discharge medications or  the care you received while you were in the hospital after you are discharged, you can call the unit and asked to speak with the hospitalist on call if the hospitalist that took care of you is not available. Once you are discharged, your primary care physician will handle any further  medical issues. Please note that NO REFILLS for any discharge medications will be authorized once you are discharged, as it is imperative that you return to your primary care physician (or establish a relationship with a primary care physician if you do not have one) for your aftercare needs so that they can reassess your need for medications and monitor your lab values.  Discharge Instructions    Diet regular    Complete by:  As directed      Increase activity slowly    Complete by:  As directed             Medication List    STOP taking these medications        buPROPion 150 MG 24 hr tablet  Commonly known as:  WELLBUTRIN XL      TAKE these medications        hydrOXYzine 50 MG tablet  Commonly known as:  ATARAX/VISTARIL  Take 50 mg by mouth at bedtime.     INVEGA SUSTENNA 156 MG/ML Susp injection  Generic drug:  paliperidone  Inject 156 mg into the muscle every 30 (thirty) days.     ZANTAC PO  Take 1 tablet by mouth daily as needed (heartburn).       No Known Allergies     Follow-up Information    Follow up with [facility]   Specialty:  Behavioral Health   Why:  continue follow up wiht [facility] monthly   Contact information:   [place]  [id number] [phone]        The results of significant diagnostics from this hospitalization (including imaging, microbiology, ancillary and laboratory) are listed below for reference.    Significant Diagnostic Studies: Ct Head Wo Contrast  03/06/2015  CLINICAL DATA:  Seizure.  Unresponsive. EXAM: CT HEAD WITHOUT CONTRAST TECHNIQUE: Contiguous axial images were obtained from the base of the skull through the vertex without intravenous contrast. COMPARISON:  07/04/2007 FINDINGS: There is no intracranial hemorrhage or extra-axial fluid collection. There is no mass. There is no mass effect. There is no evidence of acute infarction. Gray matter and white matter are unremarkable, with normal differentiation. Brain  volume is normal for age. There is no bone abnormality. Visible paranasal sinuses are clear. IMPRESSION: No acute intracranial findings.  Normal brain. Electronically Signed   By: Andreas Newport M.D.   On: 03/06/2015 04:05   Dg Chest Port 1 View  03/07/2015  CLINICAL DATA:  Leukocytosis.  Denies pain. EXAM: PORTABLE CHEST 1 VIEW COMPARISON:  None. FINDINGS: There is left basilar discoid atelectasis. There is mild bilateral interstitial thickening. There is no focal parenchymal opacity. There is no pleural effusion or pneumothorax. The heart and mediastinal contours are unremarkable. The osseous structures are unremarkable. IMPRESSION: There is mild bilateral interstitial thickening which may reflect interstitial infection versus mild interstitial edema. Electronically Signed   By: Kathreen Devoid   On: 03/07/2015 19:32   Dg Shoulder Right Port  03/05/2015  CLINICAL DATA:  Possible seizure. Right shoulder pain. Initial encounter. EXAM: PORTABLE RIGHT SHOULDER - 2+ VIEW COMPARISON:  05/28/2014 FINDINGS: There is no evidence of fracture or dislocation. Soft tissues are unremarkable. IMPRESSION: Negative. Electronically Signed  By: Monte Fantasia M.D.   On: 03/05/2015 18:36   US Abdomen Limited Ruq  03/08/2015  CLINICAL DATA:  Elevated LFTs EXAM: US ABDOMEN LIMITED - RIGHT UPPER QUADRANT COMPARISON:  CT scan 11/28/2008 FINDINGS: Gallbladder: No gallstones or wall thickening visualized. No sonographic Murphy sign noted. Common bile duct: Diameter: 4.3 mm in diameter within normal limits. Liver: No focal lesion identified. Within normal limits in parenchymal echogenicity. IMPRESSION: Normal right upper quadrant ultrasound. Electronically Signed   By: Lahoma Crocker M.D.   On: 03/08/2015 09:02    Microbiology: Recent Results (from the past 240 hour(s))  MRSA PCR Screening     Status: Abnormal   Collection Time: 03/05/15  9:47 PM  Result Value Ref Range Status   MRSA by PCR INVALID RESULTS, SPECIMEN SENT FOR  CULTURE (A) NEGATIVE Final    Comment:        The GeneXpert MRSA Assay (FDA approved for NASAL specimens only), is one component of a comprehensive MRSA colonization surveillance program. It is not intended to diagnose MRSA infection nor to guide or monitor treatment for MRSA infections. Mindi Slicker RN [id number] 03/06/15 A NAV[place]O   MRSA culture     Status: None   Collection Time: 03/05/15  9:47 PM  Result Value Ref Range Status   Specimen Description NOSE  Final   Special Requests NONE  Final   Culture NOMRSA Performed at [facility]   Final   Report Status 03/08/2015 FINAL  Final     Labs: Basic Metabolic Panel:  Recent Labs Lab 03/05/15 1739 03/06/15 0425 03/07/15 0355 03/08/15 0545  NA 136 137 139 138  K 3.7 3.9 4.3 3.9  CL 104 105 106 100*  CO2 21* 22 26 27   GLUCOSE 151* 135* 110* 120*  BUN 10 6 6 8   CREATININE 0.93 0.91 0.85 0.99  CALCIUM 9.4 8.8* 8.8* 9.5  MG  --  2.2 1.9 1.9  PHOS  --  3.0 3.3 4.1   Liver Function Tests:  Recent Labs Lab 03/05/15 1739 03/06/15 0425 03/07/15 0355 03/08/15 0545  AST 62* 72* 33 36  ALT 97* 96* 64* 62  ALKPHOS 94 105 89 104  BILITOT 0.5 0.4 0.8 0.7  PROT 7.3 7.2 6.5 7.3  ALBUMIN 4.1 4.0 3.5 3.9   No results for input(s): LIPASE, AMYLASE in the last 168 hours. No results for input(s): AMMONIA in the last 168 hours. CBC:  Recent Labs Lab 03/05/15 1739 03/06/15 0425 03/07/15 0355 03/08/15 0545  WBC 14.5* 16.8* 11.1* 8.2  NEUTROABS 10.0*  --   --   --   HGB 15.6 15.3 14.2 14.9  HCT 45.6 44.4 42.6 44.4  MCV 89.6 88.8 92.0 90.1  PLT 249 240 206 204   Cardiac Enzymes:  Recent Labs Lab 03/06/15 0425  CKTOTAL 169  CKMB 2.8   BNP: BNP (last 3 results) No results for input(s): BNP in the last 8760 hours.  ProBNP (last 3 results) No results for input(s): PROBNP in the last 8760 hours.  CBG:  Recent Labs Lab 03/05/15 1715  GLUCAP 146*       Signed:  Jeriah Skufca MD, PhD  [facility] 03/08/2015, 5:20 PM

## 2015-03-08 NOTE — Progress Notes (Signed)
Key Points: Use following P&T approved IV to PO antibiotic change policy.  Description contains the criteria that are approved Note: Policy Excludes:  Esophagectomy patients  PHARMACIST - PHYSICIAN COMMUNICATION DR:    CONCERNING: IV to Oral Route Change Policy  RECOMMENDATION: This patient is receiving famotidine by the intravenous route.  Based on criteria approved by the Pharmacy and Therapeutics Committee, the intravenous medication(s) is/are being converted to the equivalent oral dose form(s).   DESCRIPTION: These criteria include:  The patient is eating (either orally or via tube) and/or has been taking other orally administered medications for a least 24 hours  The patient has no evidence of active gastrointestinal bleeding or impaired GI absorption (gastrectomy, short bowel, patient on TNA or NPO).  If you have questions about this conversion, please contact the Pharmacy Department  []   [phone] )  [facility] []   [phone] )  [facility] []   [phone] )  [facility] []   [phone] )  [facility] []   [phone] )  [place], PharmD, [place] Pager [phone] 03/08/2015 2:26 PM

## 2015-03-09 LAB — HEPATITIS PANEL, ACUTE
HEP A IGM: NEGATIVE
HEP B C IGM: NEGATIVE
HEP B S AG: NEGATIVE

## 2016-10-01 IMAGING — US US ABDOMEN LIMITED
1 series · 14 of 25 positions shown · non-contrast
Comparison: CT scan 11/28/2008

CLINICAL DATA: Elevated LFTs

EXAM:
US ABDOMEN LIMITED - RIGHT UPPER QUADRANT

[Series 1: us abdomen limited · 0.21mm/px · 14 of 54 slices shown]
[im 1/54]
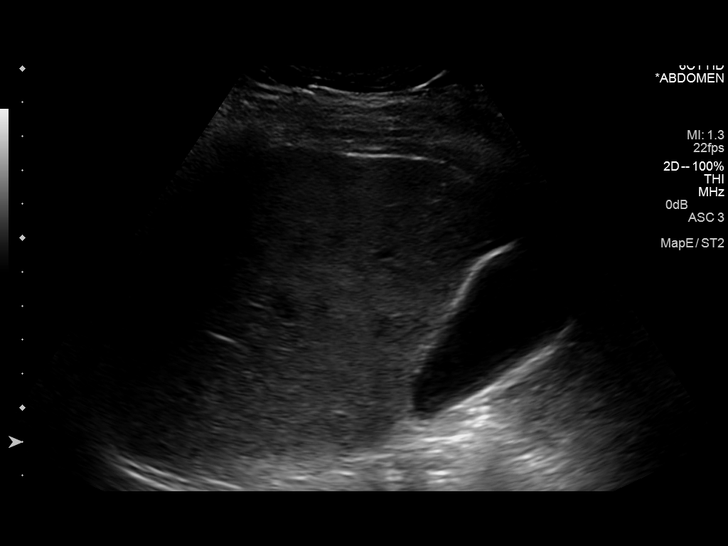
[im 5/54]
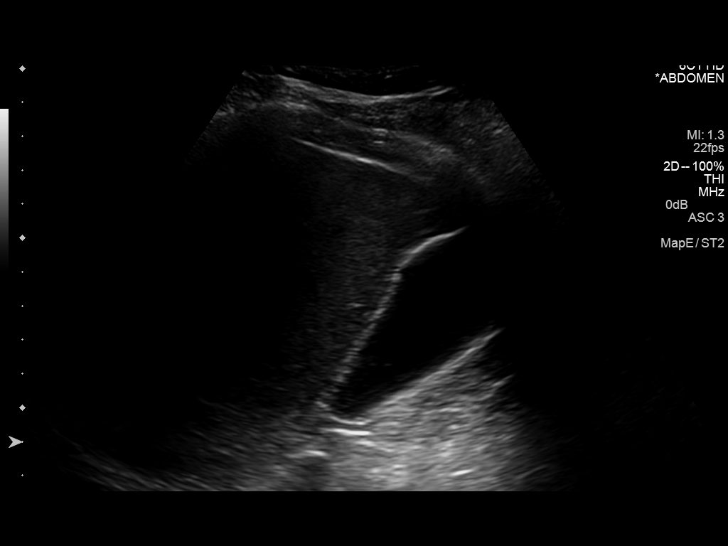
[im 9/54]
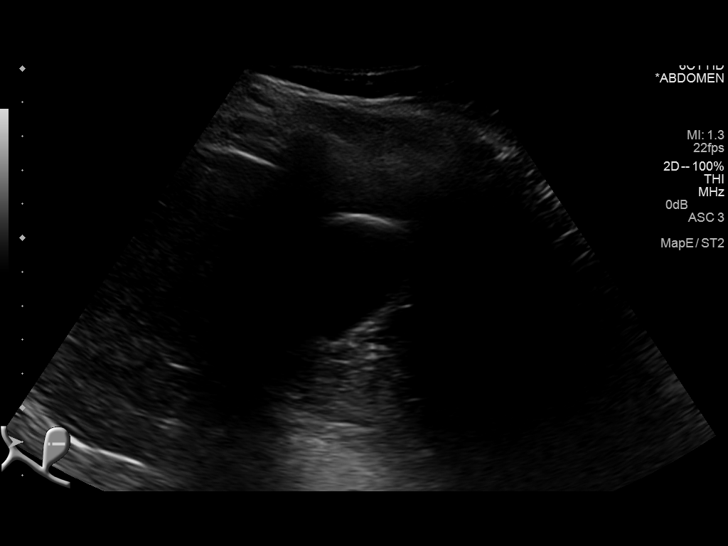
[im 14/54]
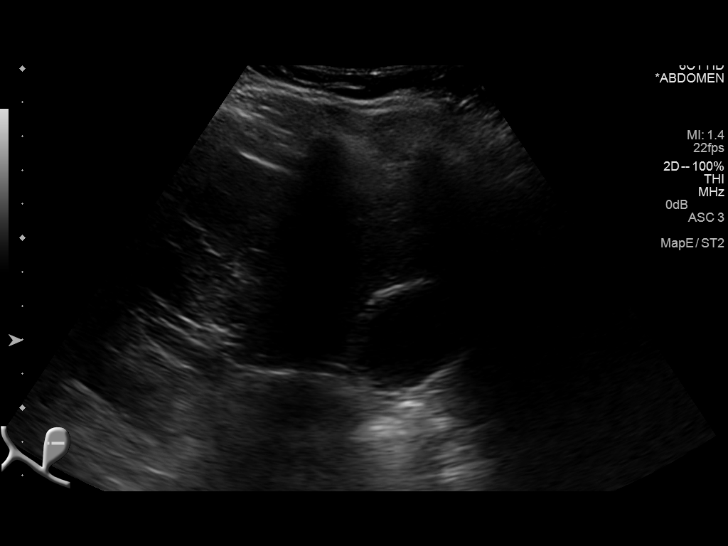
[im 18/54]
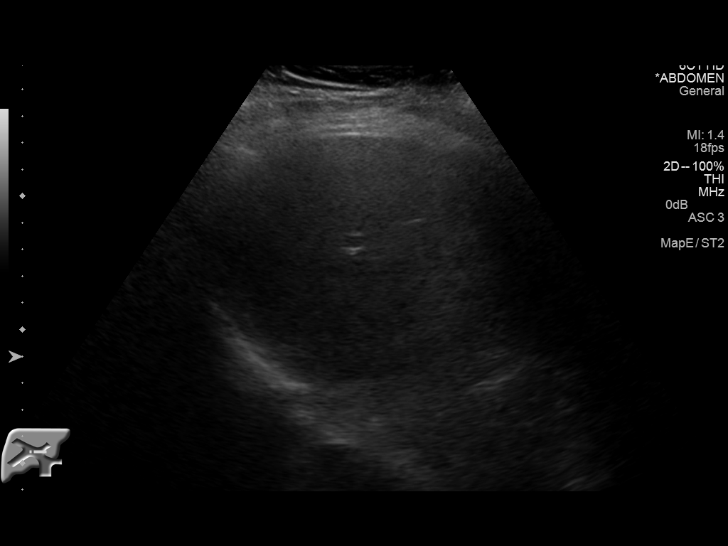
[im 20/54]
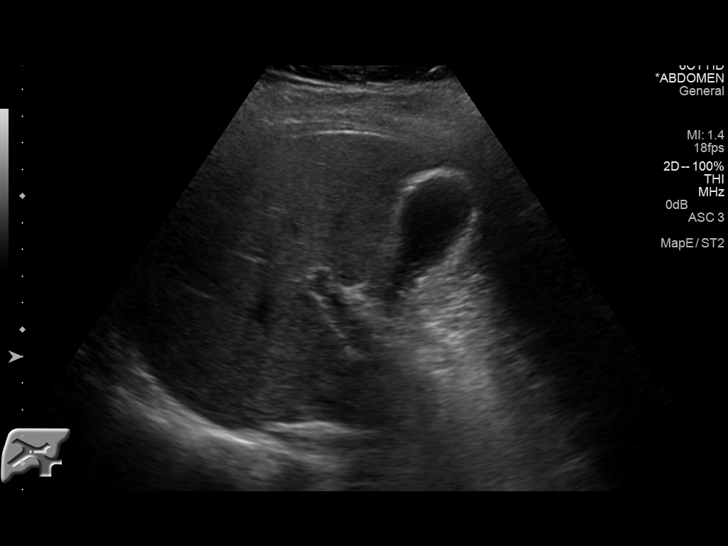
[im 25/54]
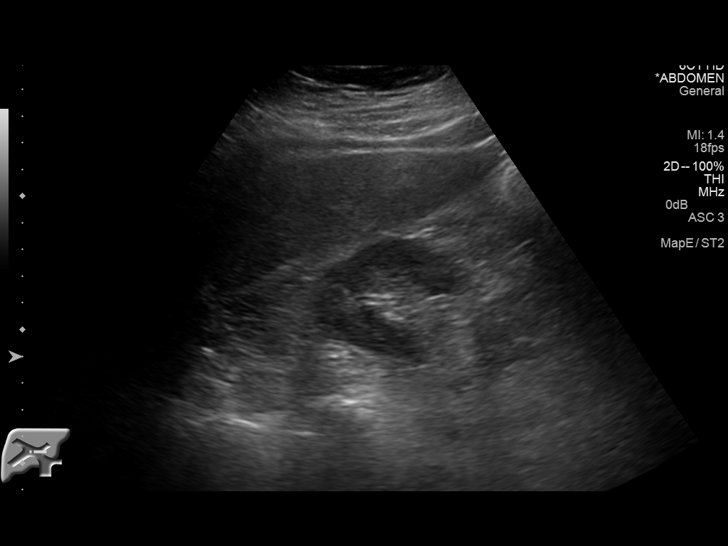
[im 29/54]
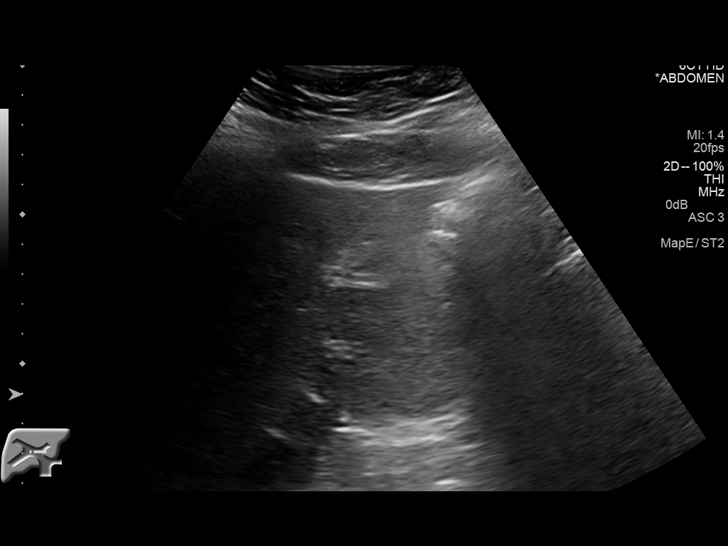
[im 34/54]
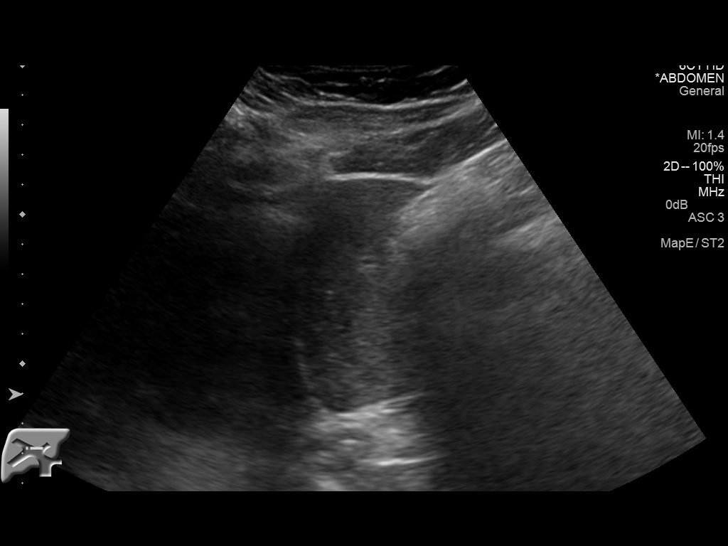
[im 36/54]
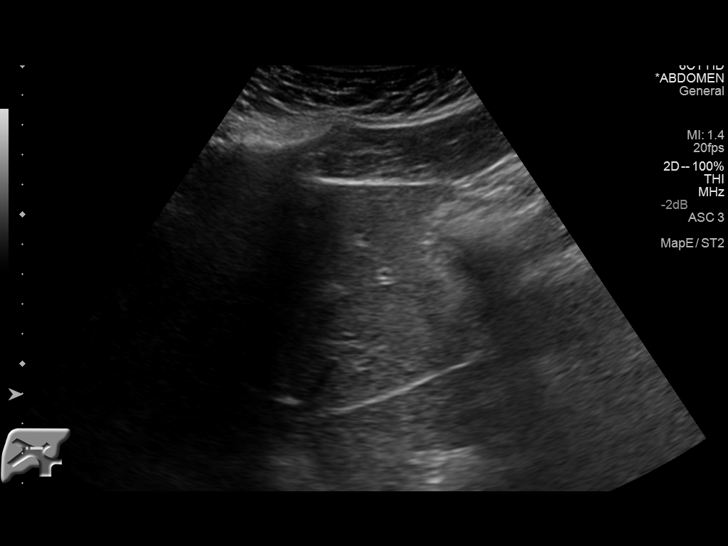
[im 40/54]
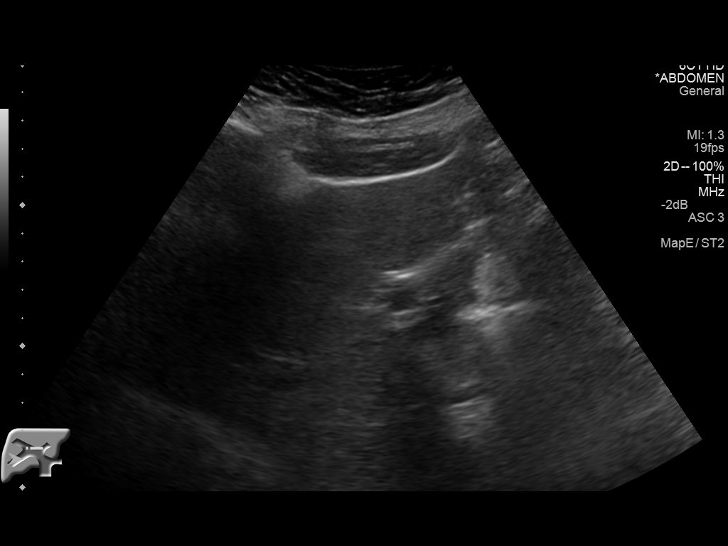
[im 45/54]
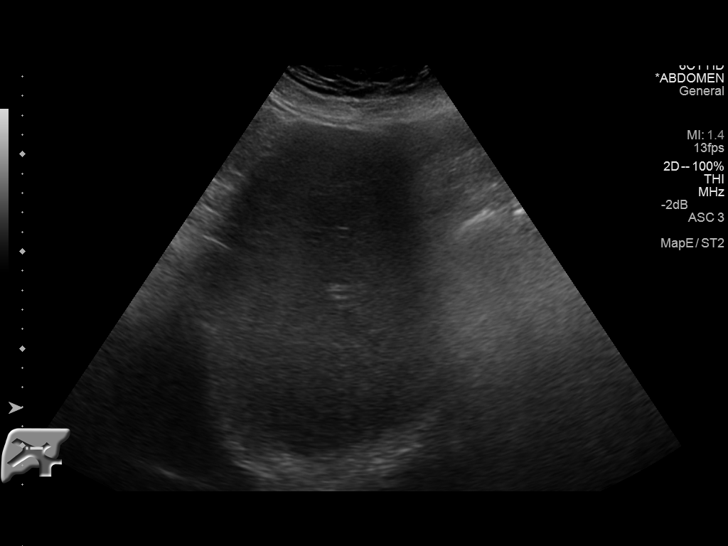
[im 49/54]
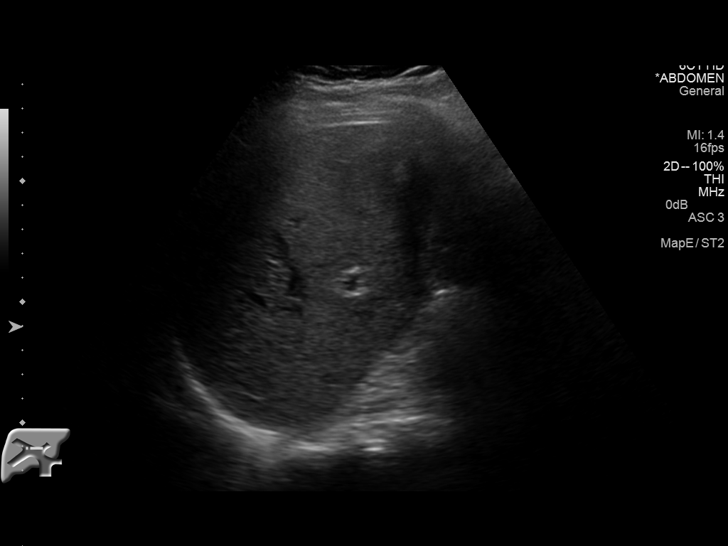
[im 54/54]
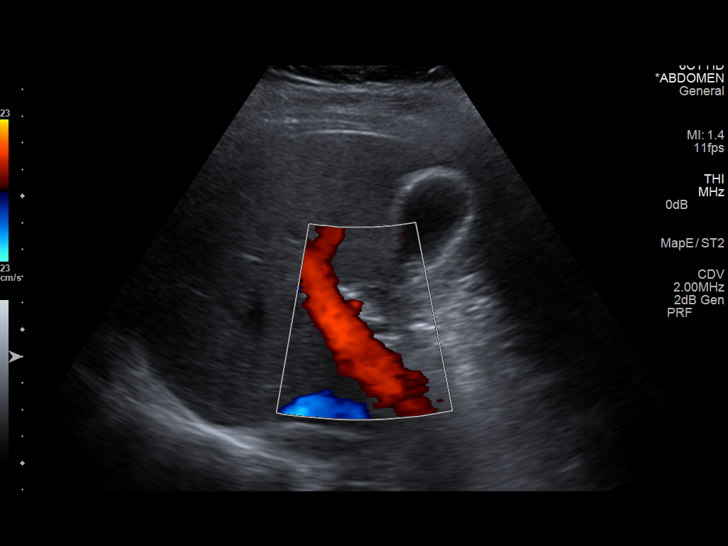

[14 of 25 positions shown; findings below may reference images not displayed]

FINDINGS: Gallbladder:

No gallstones or wall thickening visualized. No sonographic Murphy
sign noted.

Common bile duct:

Diameter: 4.3 mm in diameter within normal limits.

Liver:

No focal lesion identified. Within normal limits in parenchymal
echogenicity.
IMPRESSION: Normal right upper quadrant ultrasound.

## 2016-12-12 IMAGING — CT CT HEAD W/O CM
1 of 2 series · 16 of 30 positions shown, 20 images · non-contrast
Comparison: 07/04/2007

CLINICAL DATA: Seizure.  Unresponsive.

EXAM:
CT HEAD WITHOUT CONTRAST
TECHNIQUE: Contiguous axial images were obtained from the base of the skull
through the vertex without intravenous contrast.

[Series 2: headseq 4.8 h45s · axial · 0.43mm/px · z∈[-113,+39]mm · 16 of 36 slices shown, 20 images]
[im 2/36  brain]
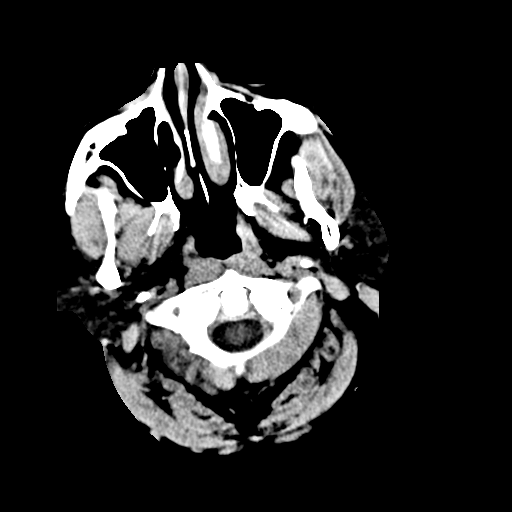
[im 2/36  bone]
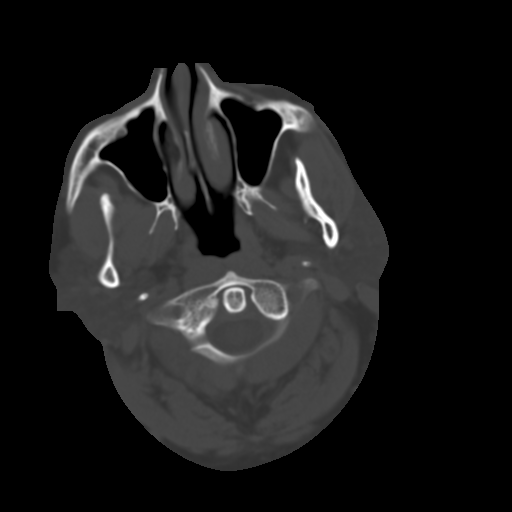
[im 5/36  brain]
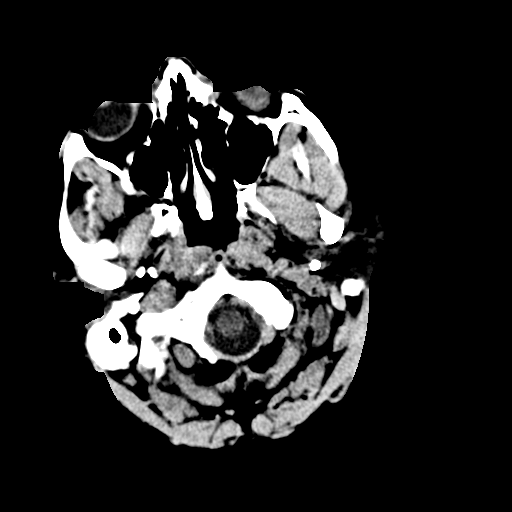
[im 6/36  brain]
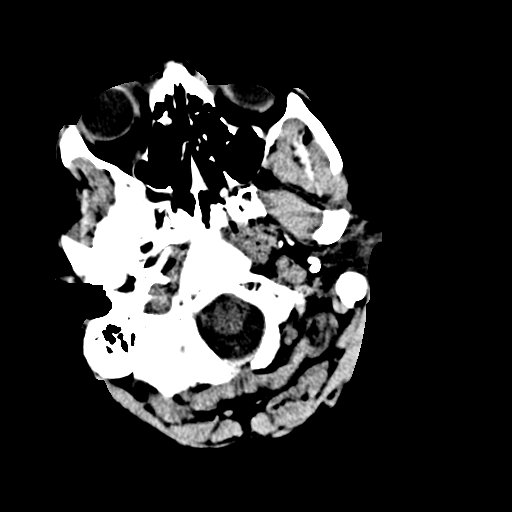
[im 9/36  brain]
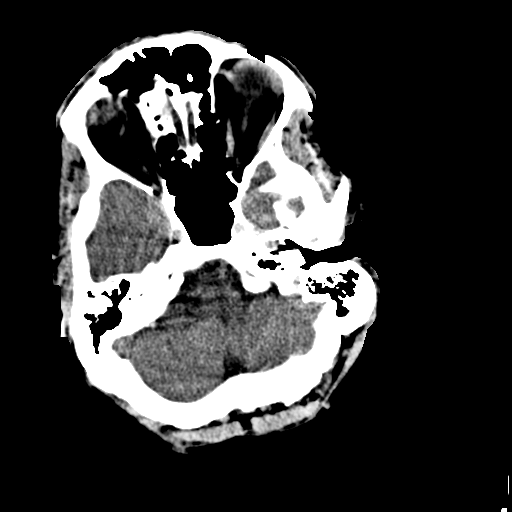
[im 10/36  brain]
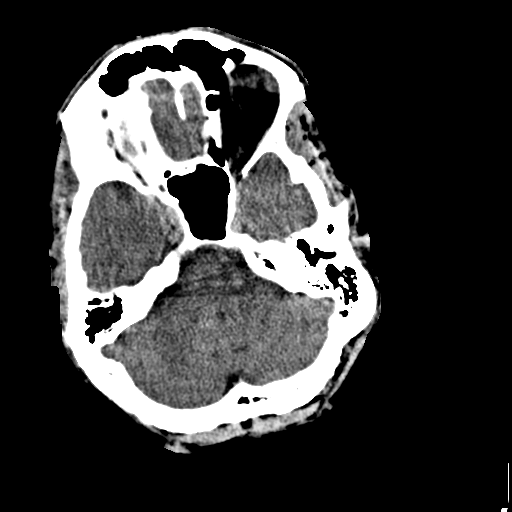
[im 10/36  bone]
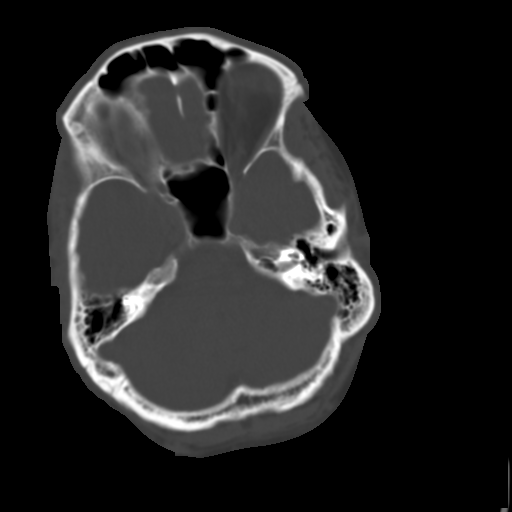
[im 13/36  brain]
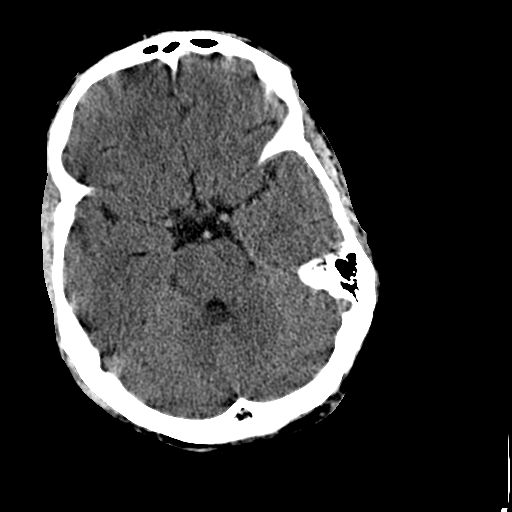
[im 15/36  brain]
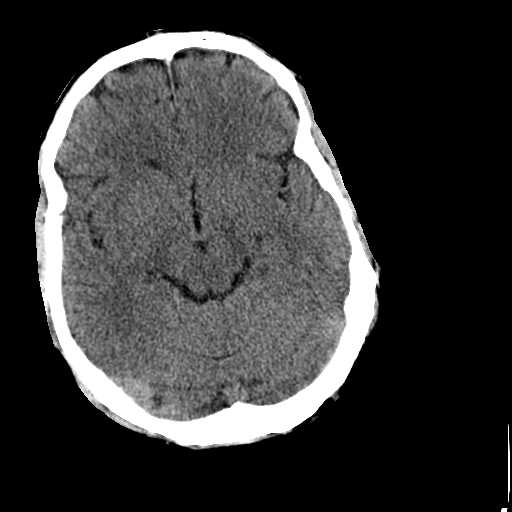
[im 17/36  brain]
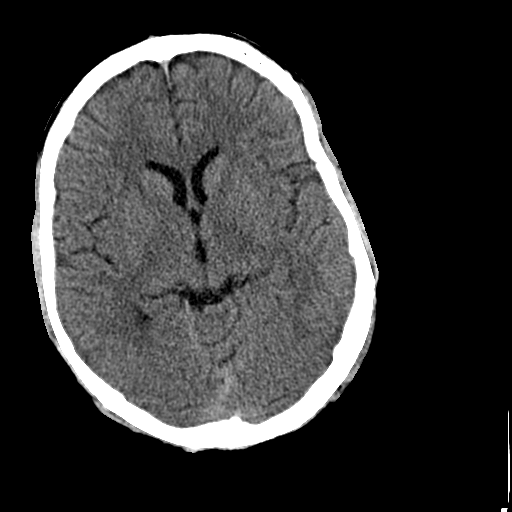
[im 19/36  brain]
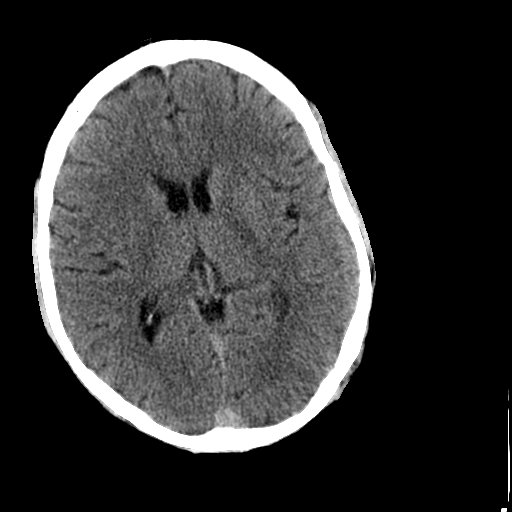
[im 19/36  bone]
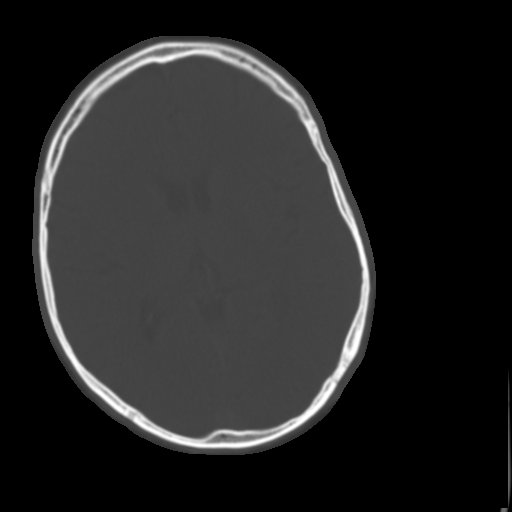
[im 22/36  brain]
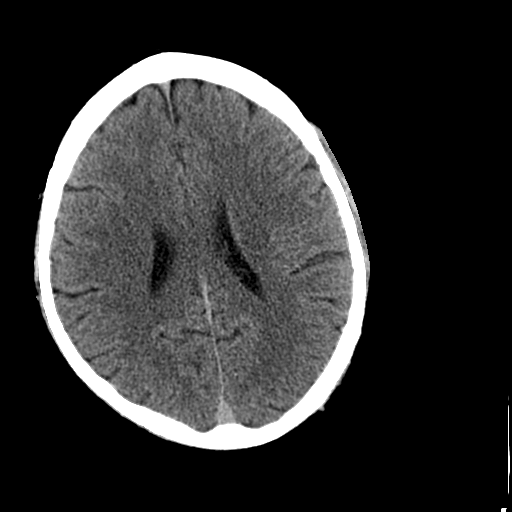
[im 23/36  brain]
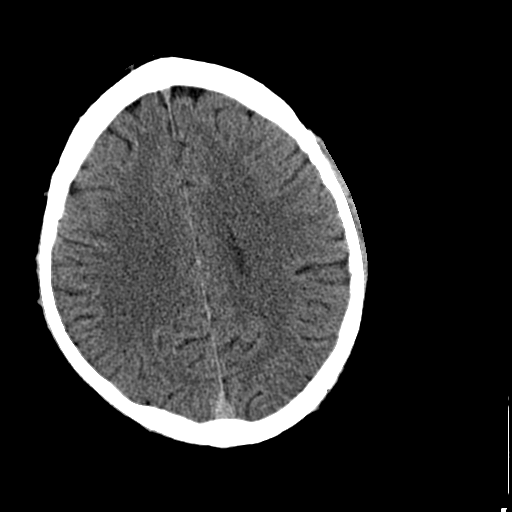
[im 26/36  brain]
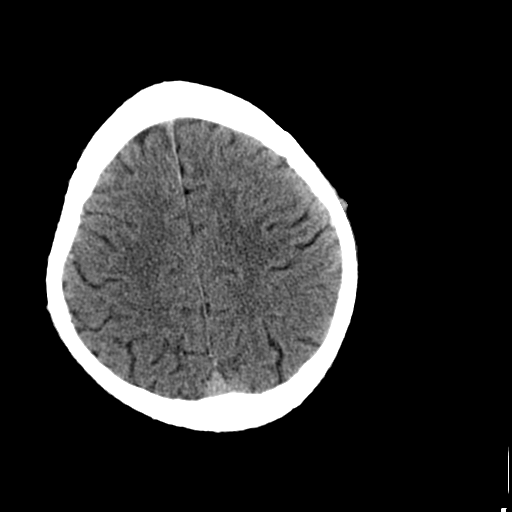
[im 27/36  brain]
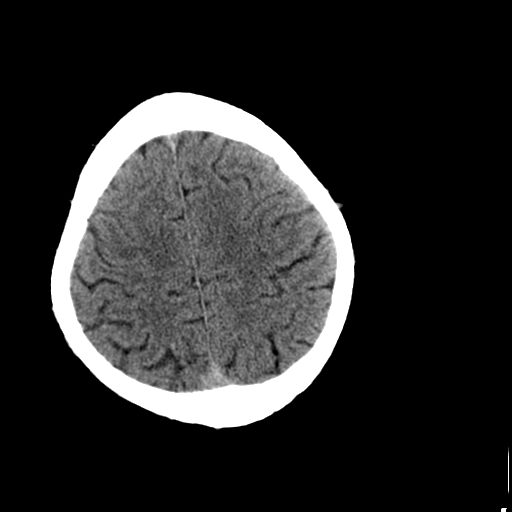
[im 27/36  bone]
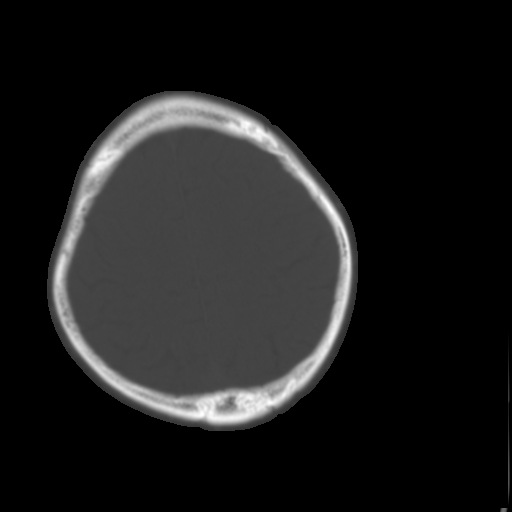
[im 30/36  brain]
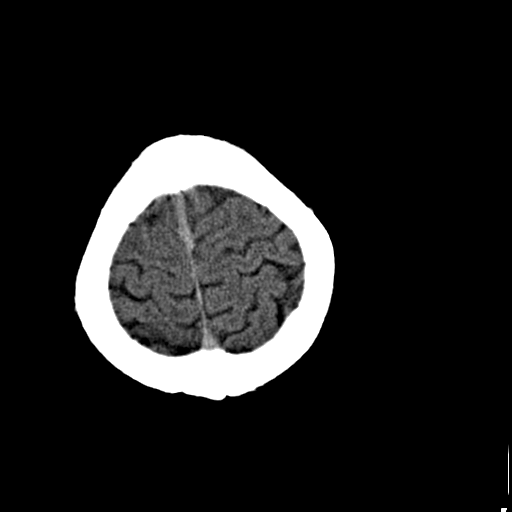
[im 31/36  brain]
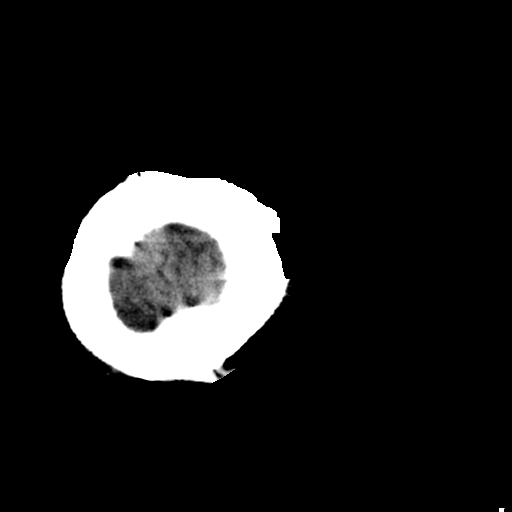
[im 34/36  brain]
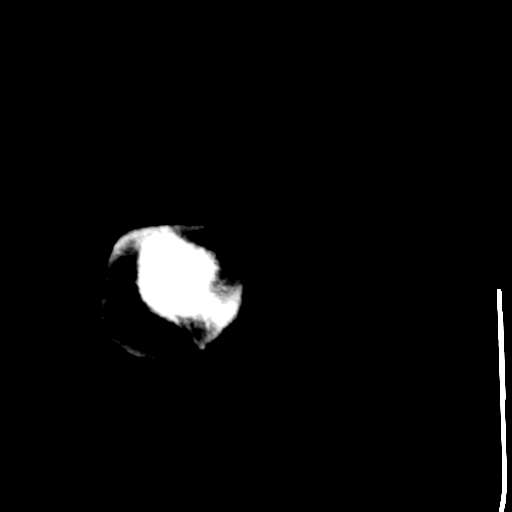

[16 of 30 positions shown; findings below may reference images not displayed]

FINDINGS: There is no intracranial hemorrhage or extra-axial fluid collection.
There is no mass. There is no mass effect. There is no evidence of
acute infarction. Gray matter and white matter are unremarkable,
with normal differentiation. Brain volume is normal for age. There
is no bone abnormality. Visible paranasal sinuses are clear.
IMPRESSION: No acute intracranial findings.  Normal brain.

## 2016-12-13 IMAGING — DX DG CHEST 1V PORT
1 series · 1 of 1 positions shown · non-contrast
Comparison: None.

CLINICAL DATA: Leukocytosis.  Denies pain.

EXAM:
PORTABLE CHEST 1 VIEW

[chest ap]
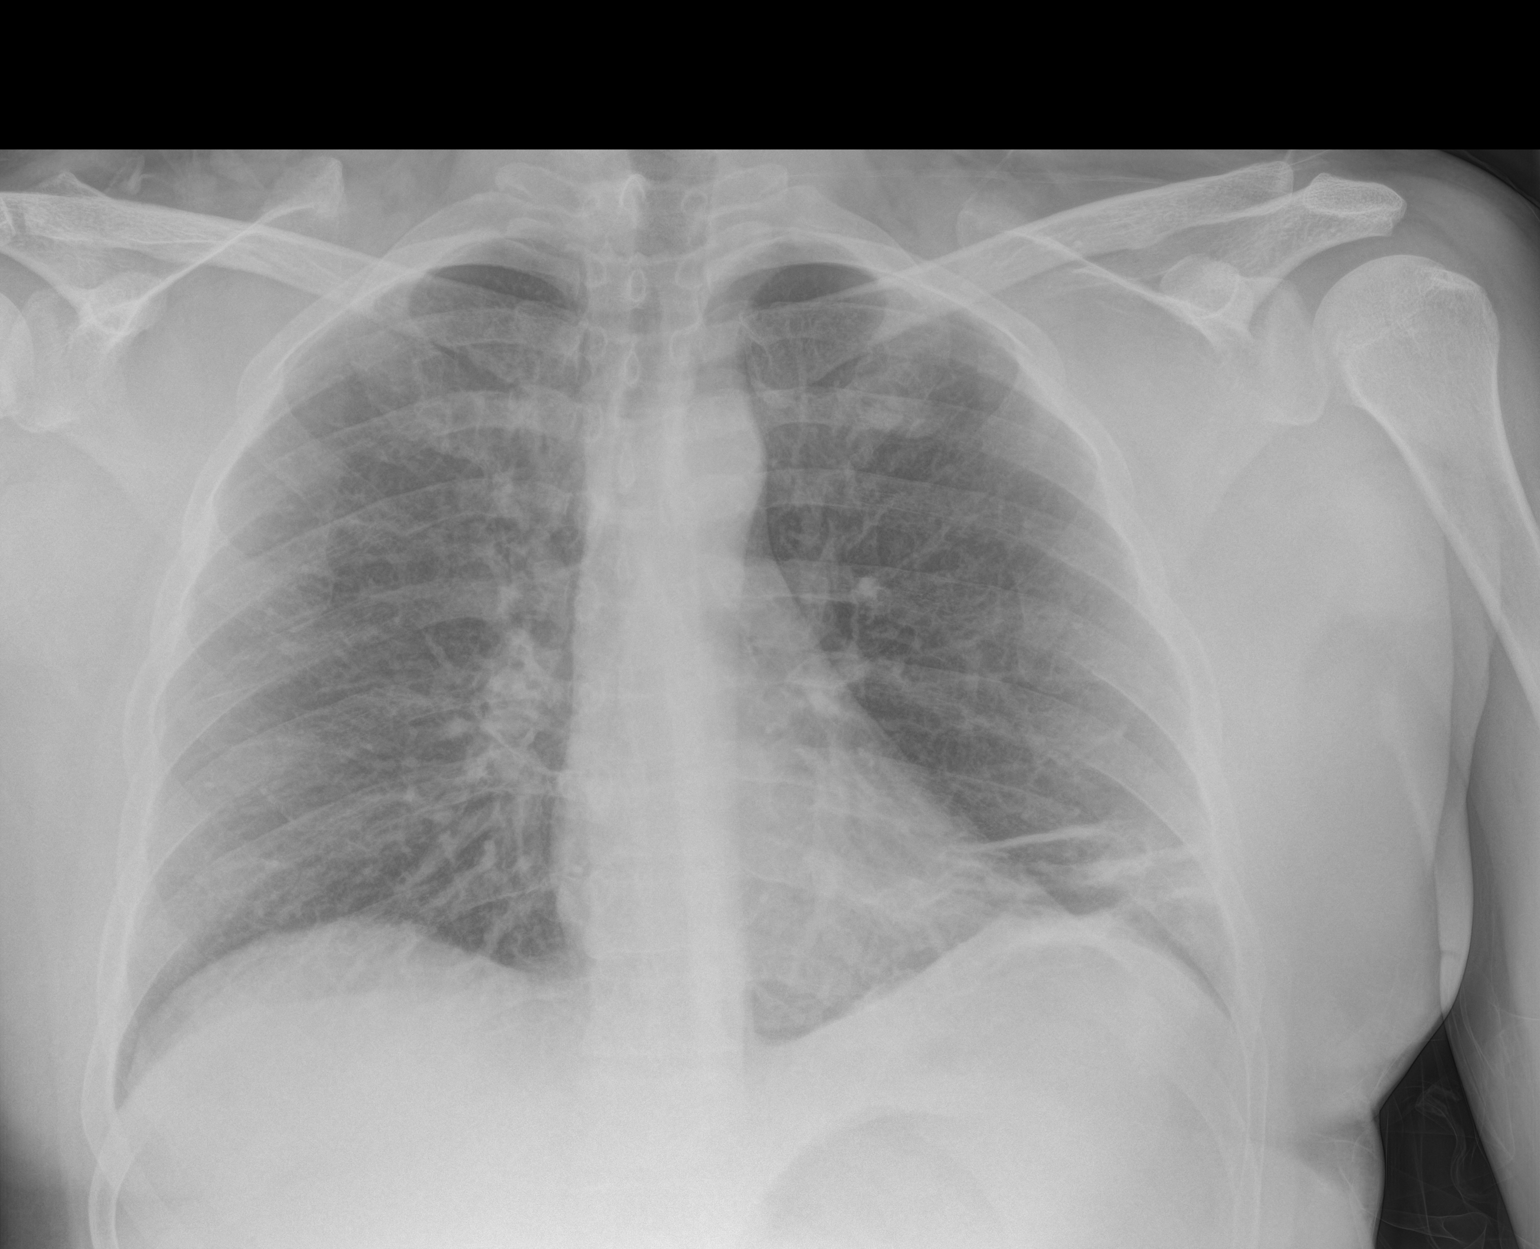

[1 of 1 positions shown; findings below may reference images not displayed]

FINDINGS: There is left basilar discoid atelectasis. There is mild bilateral
interstitial thickening. There is no focal parenchymal opacity.
There is no pleural effusion or pneumothorax. The heart and
mediastinal contours are unremarkable.

The osseous structures are unremarkable.
IMPRESSION: There is mild bilateral interstitial thickening which may reflect
interstitial infection versus mild interstitial edema.

## 2017-02-03 DIAGNOSIS — F209 Schizophrenia, unspecified: Secondary | ICD-10-CM | POA: Diagnosis not present

## 2017-02-04 DIAGNOSIS — F209 Schizophrenia, unspecified: Secondary | ICD-10-CM | POA: Diagnosis not present

## 2017-05-12 DIAGNOSIS — F209 Schizophrenia, unspecified: Secondary | ICD-10-CM | POA: Diagnosis not present

## 2017-08-04 DIAGNOSIS — F209 Schizophrenia, unspecified: Secondary | ICD-10-CM | POA: Diagnosis not present

## 2017-10-10 ENCOUNTER — Other Ambulatory Visit: Payer: Self-pay

## 2017-10-10 ENCOUNTER — Emergency Department (HOSPITAL_COMMUNITY)
Admission: EM | Admit: 2017-10-10 | Discharge: 2017-10-11 | Disposition: A | Discharge status: Home / Self Care | Payer: Medicare Other | Attending: Emergency Medicine | Admitting: Emergency Medicine

## 2017-10-10 DIAGNOSIS — F17210 Nicotine dependence, cigarettes, uncomplicated: Secondary | ICD-10-CM | POA: Insufficient documentation

## 2017-10-10 DIAGNOSIS — F331 Major depressive disorder, recurrent, moderate: Secondary | ICD-10-CM | POA: Insufficient documentation

## 2017-10-10 DIAGNOSIS — Z79899 Other long term (current) drug therapy: Secondary | ICD-10-CM | POA: Insufficient documentation

## 2017-10-10 DIAGNOSIS — F1410 Cocaine abuse, uncomplicated: Secondary | ICD-10-CM | POA: Diagnosis not present

## 2017-10-10 DIAGNOSIS — F1020 Alcohol dependence, uncomplicated: Secondary | ICD-10-CM | POA: Diagnosis not present

## 2017-10-10 DIAGNOSIS — F1320 Sedative, hypnotic or anxiolytic dependence, uncomplicated: Secondary | ICD-10-CM | POA: Diagnosis not present

## 2017-10-10 DIAGNOSIS — F200 Paranoid schizophrenia: Secondary | ICD-10-CM

## 2017-10-10 LAB — SALICYLATE LEVEL: Salicylate Lvl: <7 mg/dL (ref 2.8–30.0)

## 2017-10-10 LAB — URINALYSIS, ROUTINE W REFLEX MICROSCOPIC
Bilirubin Urine: NEGATIVE
Glucose, UA: NEGATIVE mg/dL
HGB URINE DIPSTICK: NEGATIVE
Ketones, ur: NEGATIVE mg/dL
LEUKOCYTES UA: NEGATIVE
NITRITE: NEGATIVE
PROTEIN: NEGATIVE mg/dL
Specific Gravity, Urine: 1.001 — ABNORMAL LOW (ref 1.005–1.030)
pH: 7 (ref 5.0–8.0)

## 2017-10-10 LAB — CBC WITH DIFFERENTIAL/PLATELET
BASOS ABS: 0 10*3/uL (ref 0.0–0.1)
Basophils Relative: 0 %
EOS PCT: 4 %
Eosinophils Absolute: 0.6 10*3/uL (ref 0.0–0.7)
HCT: 47.7 % (ref 39.0–52.0)
Hemoglobin: 16.3 g/dL (ref 13.0–17.0)
Lymphocytes Relative: 25 %
Lymphs Abs: 3.3 10*3/uL (ref 0.7–4.0)
MCH: 32 pg (ref 26.0–34.0)
MCHC: 34.2 g/dL (ref 30.0–36.0)
MCV: 93.5 fL (ref 78.0–100.0)
Monocytes Absolute: 0.8 10*3/uL (ref 0.1–1.0)
Monocytes Relative: 6 %
NEUTROS PCT: 65 %
Neutro Abs: 8.3 10*3/uL — ABNORMAL HIGH (ref 1.7–7.7)
PLATELETS: 260 10*3/uL (ref 150–400)
RBC: 5.1 MIL/uL (ref 4.22–5.81)
RDW: 14 % (ref 11.5–15.5)
WBC: 12.9 10*3/uL — AB (ref 4.0–10.5)

## 2017-10-10 LAB — COMPREHENSIVE METABOLIC PANEL
ALT: 19 U/L (ref 0–44)
AST: 22 U/L (ref 15–41)
Albumin: 3.7 g/dL (ref 3.5–5.0)
Alkaline Phosphatase: 65 U/L (ref 38–126)
Anion gap: 9 (ref 5–15)
BUN: 6 mg/dL (ref 6–20)
CHLORIDE: 105 mmol/L (ref 98–111)
CO2: 27 mmol/L (ref 22–32)
CREATININE: 0.85 mg/dL (ref 0.61–1.24)
Calcium: 9.3 mg/dL (ref 8.9–10.3)
GFR calc Af Amer: >60 mL/min (ref >60)
Glucose, Bld: 119 mg/dL — ABNORMAL HIGH (ref 70–99)
Potassium: 3.2 mmol/L — ABNORMAL LOW (ref 3.5–5.1)
SODIUM: 141 mmol/L (ref 135–145)
Total Bilirubin: 0.7 mg/dL (ref 0.3–1.2)
Total Protein: 6.9 g/dL (ref 6.5–8.1)

## 2017-10-10 LAB — RAPID URINE DRUG SCREEN, HOSP PERFORMED
Amphetamines: NOT DETECTED
Benzodiazepines: POSITIVE — AB
Cocaine: POSITIVE — AB
OPIATES: NOT DETECTED
Tetrahydrocannabinol: NOT DETECTED

## 2017-10-10 LAB — ACETAMINOPHEN LEVEL

## 2017-10-10 LAB — ETHANOL: Alcohol, Ethyl (B): <10 mg/dL (ref <10)

## 2017-10-10 MED ORDER — LORAZEPAM 1 MG PO TABS: 0.0000 mg | ORAL_TABLET | Freq: Four times a day (QID) | ORAL | Status: DC

## 2017-10-10 MED ORDER — ALUM & MAG HYDROXIDE-SIMETH 200-200-20 MG/5ML PO SUSP
30.0000 mL | Freq: Four times a day (QID) | ORAL | Status: DC | PRN
  Administered 2017-10-11: 30 mL via ORAL
  Filled 2017-10-10: qty 30

## 2017-10-10 MED ORDER — VITAMIN B-1 100 MG PO TABS
100.0000 mg | ORAL_TABLET | Freq: Every day | ORAL | Status: DC
  Administered 2017-10-11: 100 mg via ORAL
  Filled 2017-10-10: qty 1

## 2017-10-10 MED ORDER — LORAZEPAM 2 MG/ML IJ SOLN: 0.0000 mg | Freq: Two times a day (BID) | INTRAMUSCULAR | Status: DC

## 2017-10-10 MED ORDER — ONDANSETRON HCL 4 MG PO TABS: 4.0000 mg | ORAL_TABLET | Freq: Three times a day (TID) | ORAL | Status: DC | PRN

## 2017-10-10 MED ORDER — LORAZEPAM 2 MG/ML IJ SOLN: 0.0000 mg | Freq: Four times a day (QID) | INTRAMUSCULAR | Status: DC

## 2017-10-10 MED ORDER — IBUPROFEN 200 MG PO TABS: 600.0000 mg | ORAL_TABLET | Freq: Three times a day (TID) | ORAL | Status: DC | PRN

## 2017-10-10 MED ORDER — THIAMINE HCL 100 MG/ML IJ SOLN: 100.0000 mg | Freq: Every day | INTRAMUSCULAR | Status: DC

## 2017-10-10 MED ORDER — LORAZEPAM 1 MG PO TABS: 0.0000 mg | ORAL_TABLET | Freq: Two times a day (BID) | ORAL | Status: DC

## 2017-10-10 NOTE — ED Notes (Signed)
Pt refused to give blood and urine.

## 2017-10-10 NOTE — ED Triage Notes (Signed)
Pt to ED by [employer] with IVC paperwork. Pt has hx of schizophrenia and has refused his medication injections for the last 3 months. Pt is not sleeping, eating, or taking care of his personal hygiene. Pt is also a heavy alcohol and drug abuser. His brother states he is unsure of what drugs he has taken and his mental state has drastically declined. Pt walks out in traffic, cannot walk without falling, doesn't know where he is most of the day, and speech is slurred.

## 2017-10-10 NOTE — ED Notes (Signed)
Pt has changed and labs except urine have been obtained

## 2017-10-10 NOTE — ED Notes (Signed)
1 personal belong bag in locker 29

## 2017-10-10 NOTE — ED Notes (Signed)
Bed: [facility]3 Expected date:  Expected time:  Means of arrival:  Comments: 

## 2017-10-10 NOTE — ED Notes (Signed)
Pt states to this nurse that he doesn't want to give any blood or urine, "it is his right to refuse"

## 2017-10-10 NOTE — ED Provider Notes (Addendum)
[facility] DEPT Provider Note   CSN: [id number] Arrival date & time: 10/10/17  1734     History   Chief Complaint Chief Complaint  Patient presents with  . IVC    IVC  . Altered Mental Status    HPI [name] is a 37 y.o. male.  The history is provided by the patient. No language interpreter was used.  Altered Mental Status      [name] is a 37 y.o. male who presents to the Emergency Department complaining of IVC. He presents to the emergency department as a psychiatric evaluation under IVC. He states he has no current complaints and is feeling well. He states that he is on and vague injections every three months as well as trazodone. He states that he is compliant with his medications. He does endorse smoking crack cocaine occasionally, last use was about three weeks ago. He also endorses drinking alcohol occasionally and smoking tobacco. He resides with his mother. He denies any SI, HI or hallucinations. He feels safe in the home.  No past medical history on file.  Patient Active Problem List   Diagnosis Date Noted  . Overdose 03/05/2015  . Seizures ([place])   . Tachycardia   . Right shoulder pain     Past Surgical History:  Procedure Laterality Date  . APPENDECTOMY    . TIBIA FRACTURE SURGERY     right        Home Medications    Prior to Admission medications   Medication Sig Start Date End Date Taking? Authorizing Provider  hydrOXYzine (ATARAX/VISTARIL) 50 MG tablet Take 50 mg by mouth at bedtime.    [provider]  paliperidone (INVEGA SUSTENNA) 156 MG/ML SUSP injection Inject 156 mg into the muscle every 30 (thirty) days.    [provider]  RaNITidine HCl (ZANTAC PO) Take 1 tablet by mouth daily as needed (heartburn).    [provider]    Family History No family history on file.  Social History Social History   Tobacco Use  . Smoking status: Current Some Day Smoker    Types:  Cigars  Substance Use Topics  . Alcohol use: Yes    Comment: weekly  . Drug use: Yes    Types: Marijuana    Comment: hx 2015 cocaine     Allergies   Patient has no known allergies.   Review of Systems Review of Systems  All other systems reviewed and are negative.    Physical Exam Updated Vital Signs BP 110/61 (BP Location: Left Arm)   Pulse 70   Temp 98.4 F (36.9 C) (Oral)   Resp 18   Ht 5\' 9"  (1.753 m)   Wt 84.4 kg (186 lb)   SpO2 95%   BMI 27.47 kg/m   Physical Exam  Constitutional: He is oriented to person, place, and time. He appears well-developed and well-nourished.  HENT:  Head: Normocephalic and atraumatic.  Cardiovascular: Normal rate and regular rhythm.  No murmur heard. Pulmonary/Chest: Effort normal and breath sounds normal. No respiratory distress.  Abdominal: Soft. There is no tenderness. There is no rebound and no guarding.  Musculoskeletal: He exhibits no edema or tenderness.  Neurological: He is alert and oriented to person, place, and time.  Five out of five strength in all four extremities  Skin: Skin is warm and dry.  Psychiatric: He has a normal mood and affect. His behavior is normal.  Nursing note and vitals reviewed.  ED Treatments / Results  Labs (all labs ordered are listed, but only abnormal results are displayed) Labs Reviewed  RAPID URINE DRUG SCREEN, HOSP PERFORMED - Abnormal; Notable for the following components:      Result Value   Cocaine POSITIVE (*)    Benzodiazepines POSITIVE (*)    Barbiturates   (*)    Value: Result not available. Reagent lot number recalled by manufacturer.   All other components within normal limits  ACETAMINOPHEN LEVEL - Abnormal; Notable for the following components:   Acetaminophen (Tylenol), Serum <10 (*)    All other components within normal limits  URINALYSIS, ROUTINE W REFLEX MICROSCOPIC - Abnormal; Notable for the following components:   Color, Urine COLORLESS (*)    Specific  Gravity, Urine 1.001 (*)    All other components within normal limits  CBC WITH DIFFERENTIAL/PLATELET - Abnormal; Notable for the following components:   WBC 12.9 (*)    Neutro Abs 8.3 (*)    All other components within normal limits  COMPREHENSIVE METABOLIC PANEL - Abnormal; Notable for the following components:   Potassium 3.2 (*)    Glucose, Bld 119 (*)    All other components within normal limits  SALICYLATE LEVEL  ETHANOL    EKG EKG Interpretation  Date/Time:  Sunday October 10 2017 20:54:43 EDT Ventricular Rate:  72 PR Interval:  150 QRS Duration: 74 QT Interval:  398 QTC Calculation: 435 R Axis:   76 Text Interpretation:  Normal sinus rhythm with sinus arrhythmia Normal ECG Confirmed by Quintella Reichert [phone]) on 10/10/2017 9:12:45 PM   Radiology No results found.  Procedures Procedures (including critical care time)  Medications Ordered in ED Medications  ibuprofen (ADVIL,MOTRIN) tablet 600 mg (has no administration in time range)  ondansetron (ZOFRAN) tablet 4 mg (has no administration in time range)  alum & mag hydroxide-simeth (MAALOX/MYLANTA) 200-200-20 MG/5ML suspension 30 mL (has no administration in time range)  LORazepam (ATIVAN) injection 0-4 mg (has no administration in time range)    Or  LORazepam (ATIVAN) tablet 0-4 mg (has no administration in time range)  LORazepam (ATIVAN) injection 0-4 mg (has no administration in time range)    Or  LORazepam (ATIVAN) tablet 0-4 mg (has no administration in time range)  thiamine (VITAMIN B-1) tablet 100 mg (has no administration in time range)    Or  thiamine (B-1) injection 100 mg (has no administration in time range)     Initial Impression / Assessment and Plan / ED Course  I have reviewed the triage vital signs and the nursing notes.  Pertinent labs & imaging results that were available during my care of the patient were reviewed by me and considered in my medical decision making (see chart for details).      Patient here under IVC by his brother. Per IVC report patient has refused to receive his injections every three months for schizophrenia. He hasn't been sleeping, eating or tending to his personal hygiene. He is a heavy drug and alcohol abuser. His mental state has drastically declined. He walks out into traffic, cannot walk without following, very incoherent, speech is slurred.  In the emergency department he is alert and appropriate and not suicidal, homicidal or aggressive towards others.  No family available to provide additional hx.  He has been medically cleared for psychiatric evaluation and treatment.     Final Clinical Impressions(s) / ED Diagnoses   Final diagnoses:  None    ED Discharge Orders    None  Quintella Reichert, MD 10/10/17 5947    Quintella Reichert, MD 10/10/17 2348
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
## 2017-10-10 NOTE — ED Notes (Signed)
Bed: [facility] Expected date:  Expected time:  Means of arrival:  Comments: TCU 29

## 2017-10-11 DIAGNOSIS — F200 Paranoid schizophrenia: Secondary | ICD-10-CM

## 2017-10-11 MED ORDER — NICOTINE 21 MG/24HR TD PT24
21.0000 mg | MEDICATED_PATCH | Freq: Every day | TRANSDERMAL | Status: DC
  Administered 2017-10-11: 21 mg via TRANSDERMAL
  Filled 2017-10-11: qty 1

## 2017-10-11 NOTE — ED Notes (Signed)
Pt's name called x 2, pt snoring softly, breathing unlabored, no visible sweat, no apparent distress.

## 2017-10-11 NOTE — ED Notes (Signed)
Pt discharged safely with discharge instructions reviewed.  Pts. Guardian was informed.  Bus pass was given.  Pt was in no distress.  All belongings were returned.

## 2017-10-11 NOTE — ED Notes (Signed)
Pt lying in bed, no visible sweat, breathing unlabored, no apparent distress.

## 2017-10-11 NOTE — BH Assessment (Signed)
[facility] Assessment Progress Note  Per Buford Dresser, DO, this pt does not require psychiatric hospitalization at this time.  Pt presentd under IVC initiated by pt's brother/legal guardian, which Dr Mariea Clonts has rescinded.  Pt is to be discharged from [facility] with recommendation to continue treatment with [facility].  This has been included in pt's discharge instructions.  Pt's nurse, Nena Jordan, has been notified.  Jalene Mullet, [facility] Triage Specialist [phone]

## 2017-10-11 NOTE — Consult Note (Addendum)
[facility] Psych ED Discharge  10/11/2017 1:11 PM [name]  MRN:  [id number] Principal Problem: Schizophrenia, paranoid type [facility]) Discharge Diagnoses:  Patient Active Problem List   Diagnosis Date Noted  . Overdose [T50.901A] 03/05/2015  . Seizures ([place]) [R56.9]   . Tachycardia [R00.0]   . Right shoulder pain [M25.511]     Subjective: Pt was seen and chart reviewed with treatment team and Dr Mariea Clonts. Pt denies suicidal/homicidal ideation, denies auditory/visual hallucinations and does not appear to be responding to internal stimuli. Pt stated he takes an [place] shot, every three months,  at [facility] and his next shot is due on 7-24. Pt stated he takes all of his medications and works as a Training and development officer. Pt lives with his mother but his brother is his legal guardian. Pt's brother had Pt IVC'd due to statements that he was not taking care of himself and was using drugs. Pt's UDS positive for benzos and cocaine, BAL negative. Pt admitted to using cocaine and stated it is not something he does all the time. Labs and test results were reviewed with no abnormal findings. Collateral gathered from [facility] proves that Pt has been compliant with his injections and his next one is due on 10-27-17. Pt stated he has been hospitalized in the past for his schizophrenia, last time three years ago. 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 Pt is neat, clean, calm and cooperative and is taking his medications in the ED. Pt is stable and psychiatrically clear for discharge.   Total Time spent with patient: 45 minutes  Past Psychiatric History: As above   Past Medical History: No past medical history on file. Past Surgical History:  Procedure Laterality Date  . APPENDECTOMY    . TIBIA FRACTURE SURGERY     right   Family History: No family history on file. Family Psychiatric  History: Denies  Social History:  Social History   Substance and Sexual Activity  Alcohol Use Yes   Comment:  weekly    Social History   Substance and Sexual Activity  Drug Use Yes  . Types: Marijuana   Comment: hx 2015 cocaine   Social History   Socioeconomic History  . Marital status: Single    Spouse name: Not on file  . Number of children: Not on file  . Years of education: Not on file  . Highest education level: Not on file  Occupational History  . Not on file  Social Needs  . Financial resource strain: Not on file  . Food insecurity:    Worry: Not on file    Inability: Not on file  . Transportation needs:    Medical: Not on file    Non-medical: Not on file  Tobacco Use  . Smoking status: Current Some Day Smoker    Types: Cigars  Substance and Sexual Activity  . Alcohol use: Yes    Comment: weekly  . Drug use: Yes    Types: Marijuana    Comment: hx 2015 cocaine  . Sexual activity: Not on file  Lifestyle  . Physical activity:    Days per week: Not on file    Minutes per session: Not on file  . Stress: Not on file  Relationships  . Social connections:    Talks on phone: Not on file    Gets together: Not on file    Attends religious service: Not on file    Active member of club or organization:  Not on file    Attends meetings of clubs or organizations: Not on file    Relationship status: Not on file  Other Topics Concern  . Not on file  Social History Narrative  . Not on file    Has this patient used any form of tobacco in the last 30 days? (Cigarettes, Smokeless Tobacco, Cigars, and/or Pipes) Prescription not provided because: Pt does not smoke  Current Medications: Current Facility-Administered Medications  Medication Dose Route Frequency Provider Last Rate Last Dose  . alum & mag hydroxide-simeth (MAALOX/MYLANTA) 200-200-20 MG/5ML suspension 30 mL  30 mL Oral Q6H PRN Quintella Reichert, MD   30 mL at 10/11/17 0934  . ibuprofen (ADVIL,MOTRIN) tablet 600 mg  600 mg Oral Q8H PRN Quintella Reichert, MD      . LORazepam (ATIVAN) injection 0-4 mg  0-4 mg Intravenous Q6H  Quintella Reichert, MD       Or  . LORazepam (ATIVAN) tablet 0-4 mg  0-4 mg Oral Q6H Quintella Reichert, MD      . Derrill Memo ON 10/13/2017] LORazepam (ATIVAN) injection 0-4 mg  0-4 mg Intravenous Q12H Quintella Reichert, MD       Or  . Derrill Memo ON 10/13/2017] LORazepam (ATIVAN) tablet 0-4 mg  0-4 mg Oral Q12H Quintella Reichert, MD      . nicotine (NICODERM CQ - dosed in mg/24 hours) patch 21 mg  21 mg Transdermal Daily Faythe Dingwall, DO   21 mg at 10/11/17 0934  . ondansetron (ZOFRAN) tablet 4 mg  4 mg Oral Q8H PRN Quintella Reichert, MD      . thiamine (VITAMIN B-1) tablet 100 mg  100 mg Oral Daily Quintella Reichert, MD   100 mg at 10/11/17 8341   Or  . thiamine (B-1) injection 100 mg  100 mg Intravenous Daily Quintella Reichert, MD       Current Outpatient Medications  Medication Sig Dispense Refill  . hydrOXYzine (ATARAX/VISTARIL) 50 MG tablet Take 50 mg by mouth at bedtime.    . paliperidone (INVEGA SUSTENNA) 156 MG/ML SUSP injection Inject 156 mg into the muscle every 30 (thirty) days.    . RaNITidine HCl (ZANTAC PO) Take 1 tablet by mouth daily as needed (heartburn).     PTA Medications:  (Not in a hospital admission)  Musculoskeletal: Strength & Muscle Tone: within normal limits Gait & Station: normal Patient leans: N/A  Psychiatric Specialty Exam: Physical Exam  Nursing note and vitals reviewed. Constitutional: He is oriented to person, place, and time. He appears well-developed and well-nourished.  HENT:  Head: Normocephalic and atraumatic.  Neck: Normal range of motion.  Respiratory: Effort normal.  Musculoskeletal: Normal range of motion.  Neurological: He is alert and oriented to person, place, and time.  Psychiatric: He has a normal mood and affect. His speech is normal and behavior is normal. Thought content normal. Cognition and memory are normal. He expresses impulsivity.    Review of Systems  Psychiatric/Behavioral: Positive for substance abuse. Negative for depression,  hallucinations, memory loss and suicidal ideas. The patient is not nervous/anxious and does not have insomnia.   All other systems reviewed and are negative.   Blood pressure 99/64, pulse 87, temperature 97.7 F (36.5 C), resp. rate 18, height 5\' 9"  (1.753 m), weight 186 lb (84.4 kg), SpO2 96 %.Body mass index is 27.47 kg/m.  General Appearance: Casual  Eye Contact:  Good  Speech:  Clear and Coherent  Volume:  Normal  Mood:  Euthymic  Affect:  Congruent  Thought  Process:  Coherent, Goal Directed, Linear and Descriptions of Associations: Intact  Orientation:  Full (Time, Place, and Person)  Thought Content:  Logical  Suicidal Thoughts:  No  Homicidal Thoughts:  No  Memory:  Immediate;   Good Recent;   Good Remote;   Fair  Judgement:  Fair  Insight:  Fair  Psychomotor Activity:  Normal  Concentration:  Concentration: Good and Attention Span: Good  Recall:  Good  Fund of Knowledge:  Good  Language:  Good  Akathisia:  No  Handed:  Right  AIMS (if indicated):     Assets:  Agricultural consultant Housing Social Support Vocational/Educational  ADL's:  Intact  Cognition:  WNL  Sleep:        Demographic Factors:  Male and Low socioeconomic status  Loss Factors: Financial problems/change in socioeconomic status  Historical Factors: Impulsivity  Risk Reduction Factors:   Sense of responsibility to family and Living with another person, especially a relative  Continued Clinical Symptoms:  Schizophrenia:   Paranoid or undifferentiated type  Cognitive Features That Contribute To Risk:  Closed-mindedness    Suicide Risk:  Minimal: No identifiable suicidal ideation.  Patients presenting with no risk factors but with morbid ruminations; may be classified as minimal risk based on the severity of the depressive symptoms    Plan Of Care/Follow-up recommendations:  Activity:  as tolerated Diet:  Heart healthy  Disposition:  Discharge  Home Follow up with [facility] on 10-27-17 for your next injection of Invega Avoid the use of alcohol and illicit drugs Continue these medications: -Cogentin 0.5 mg twice daily for EPS  -Lorayne Bender Trinza injection given to you at [facility], NP 10/11/2017, 1:11 PM   Patient seen face-to-face for psychiatric evaluation, chart reviewed and case discussed with the physician extender and developed treatment plan. Reviewed the information documented and agree with the treatment plan.  Buford Dresser, DO 10/11/17 2:04 PM

## 2017-10-11 NOTE — Patient Outreach (Signed)
ED Peer Support Specialist Patient Intake (Complete at intake & 30-60 Day Follow-up)  Name: [name]  MRN: [id number]  Age: 37 y.o.   Date of Admission: 10/11/2017  Intake: Initial Comments:      Primary Reason Admitted:Delmar OCIEL [name] is an 37 y.o. male.  -Clinician discussed patient care with Dr. Ralene Bathe.  He presents to the emergency department as a psychiatric evaluation under IVC. He states he has no current complaints and is feeling well. He states that he is on and vague injections every three months as well as trazodone. He states that he is compliant with his medications. He does endorse smoking crack cocaine occasionally, last use was about three weeks ago. He also endorses drinking alcohol occasionally and smoking tobacco. He resides with his mother. He denies any SI, HI or hallucinations. He feels safe in the home.      Lab values: Alcohol/ETOH: Negative Positive UDS? Yes Amphetamines: No Barbiturates: No Benzodiazepines: Yes Cocaine: Yes Opiates: No Cannabinoids: No  Demographic information: Gender: Male Ethnicity: Other (comment)(sudanese) Marital Status: Single Insurance Status: Marketing executive (Work Neurosurgeon, Physicist, medical, etc.: Yes([employer]) Lives with: Parent Living situation: House/Apartment  Reported Patient History: Patient reported health conditions: None Patient aware of HIV and hepatitis status: No  In past year, has patient visited ED for any reason? No  Number of ED visits:    Reason(s) for visit:    In past year, has patient been hospitalized for any reason? No  Number of hospitalizations:    Reason(s) for hospitalization:    In past year, has patient been arrested? No  Number of arrests:    Reason(s) for arrest:    In past year, has patient been incarcerated? No  Number of incarcerations:    Reason(s) for incarceration:    In past year, has patient received medication-assisted treatment? No  In  past year, patient received the following treatments: Other (comment)  In past year, has patient received any harm reduction services? No  Did this include any of the following?    In past year, has patient received care from a mental health provider for diagnosis other than SUD? No  In past year, is this first time patient has overdosed? No  Number of past overdoses:    In past year, is this first time patient has been hospitalized for an overdose? No  Number of hospitalizations for overdose(s):    Is patient currently receiving treatment for a mental health diagnosis? No  Patient reports experiencing difficulty participating in SUD treatment: No    Most important reason(s) for this difficulty?    Has patient received prior services for treatment? No  In past, patient has received services from following agencies:    Plan of Care:  Suggested follow up at these agencies/treatment centers: Other (comment)(Visit with [facility] services.)  Other information: CPSS processed with Pt and was able to gain information about how he is doing and to monitor services. CPSS discussed the concerns that Pt stated he was having which led to him visiting the ER. CPSS was made aware that Pt was fine and that he does not have any concerns to address at this time. Pt addressed the fact that he has a plan to go home an continue to work in the community. CPSS was able to complete the series of questions and be able to discuss what he wanted to do to better the quality of his life. CPSS left contact information for Pt to stay  in contact with Pt in the community.    Aaron Edelman Kaylany Tesoriero, CPSS  10/11/2017 11:59 AM

## 2017-10-11 NOTE — Discharge Instructions (Signed)
For your mental health needs, you are advised to continue treatment with [facility]: ° °     [facility] °     [address] °     [place], [place] [address] °     [phone] °

## 2017-10-16 ENCOUNTER — Emergency Department (HOSPITAL_COMMUNITY)
Admission: EM | Admit: 2017-10-16 | Discharge: 2017-10-16 | Disposition: A | Discharge status: Home / Self Care | Payer: Medicare Other | Attending: Emergency Medicine | Admitting: Emergency Medicine

## 2017-10-16 ENCOUNTER — Encounter (HOSPITAL_COMMUNITY): Payer: Self-pay | Admitting: Emergency Medicine

## 2017-10-16 ENCOUNTER — Emergency Department (HOSPITAL_COMMUNITY): Payer: Medicare Other

## 2017-10-16 DIAGNOSIS — Z79899 Other long term (current) drug therapy: Secondary | ICD-10-CM | POA: Diagnosis not present

## 2017-10-16 DIAGNOSIS — M545 Low back pain, unspecified: Secondary | ICD-10-CM

## 2017-10-16 DIAGNOSIS — F17210 Nicotine dependence, cigarettes, uncomplicated: Secondary | ICD-10-CM | POA: Diagnosis not present

## 2017-10-16 NOTE — ED Notes (Signed)
Patient transported to X-ray 

## 2017-10-16 NOTE — Discharge Instructions (Signed)

## 2017-10-16 NOTE — ED Triage Notes (Signed)
Patient here from home with complaints of lower back pain. Reports that he fell in the bathroom last Saturday.

## 2017-10-16 NOTE — ED Provider Notes (Signed)
[facility] DEPT Provider Note   CSN: [id number] Arrival date & time: 10/16/17  1328     History   Chief Complaint Chief Complaint  Patient presents with  . Back Pain    HPI [name] is a 37 y.o. male.  HPI  Patient is a 37 year old male with history of schizophrenia, overdose, seizures, tachycardia, right shoulder pain who presents the emergency department today complaining of midline lower back pain that has been present for the last week.  He states that one week ago he slipped in the shower and fell onto his bottom.  Denies any head trauma or LOC.  Since then he has had constant, 6/10 pain to the coccyx region.  Pain is worse when he changes position, sits or moves around.  Pain is also worse in the morning.  He has not tried any medications for relief.  Denies any numbness or weakness to his legs.  No saddle anesthesia.  No urinary retention.  No loss control of his bowel or bladder function.  No history of IV drug use.  No history of cancer.  No fevers.  Denies any prodrome of chest pain, shortness of breath or palpitations prior to the fall.  History reviewed. No pertinent past medical history.  Patient Active Problem List   Diagnosis Date Noted  . Schizophrenia, paranoid type ([place]) 10/11/2017  . Overdose 03/05/2015  . Seizures ([place])   . Tachycardia   . Right shoulder pain     Past Surgical History:  Procedure Laterality Date  . APPENDECTOMY    . TIBIA FRACTURE SURGERY     right        Home Medications    Prior to Admission medications   Medication Sig Start Date End Date Taking? Authorizing Provider  benztropine (COGENTIN) 0.5 MG tablet Take 0.5 mg by mouth at bedtime. 10/15/17  Yes [provider]  FLUoxetine (PROZAC) 40 MG capsule Take 40 mg by mouth daily. 10/15/17  Yes [provider]  hydrOXYzine (ATARAX/VISTARIL) 50 MG tablet Take 50 mg by mouth at bedtime.   Yes [provider]  paliperidone  (INVEGA SUSTENNA) 156 MG/ML SUSP injection Inject 156 mg into the muscle every 3 (three) months.    Yes [provider]  RaNITidine HCl (ZANTAC PO) Take 1 tablet by mouth daily as needed (heartburn).   Yes [provider]  traZODone (DESYREL) 50 MG tablet Take 50 mg by mouth at bedtime. 10/15/17  Yes [provider]    Family History No family history on file.  Social History Social History   Tobacco Use  . Smoking status: Current Some Day Smoker    Types: Cigars  Substance Use Topics  . Alcohol use: Yes    Comment: weekly  . Drug use: Yes    Types: Marijuana    Comment: hx 2015 cocaine     Allergies   Patient has no known allergies.   Review of Systems Review of Systems  Constitutional: Negative for fever.  Respiratory: Negative for shortness of breath.   Cardiovascular: Negative for chest pain and palpitations.  Gastrointestinal: Negative for abdominal pain, nausea and vomiting.  Genitourinary:       No loss of control of bowel or bladder function  Musculoskeletal: Positive for back pain. Negative for gait problem and neck pain.  Neurological: Negative for weakness and numbness.       No head trauma or LOC     Physical Exam Updated Vital Signs BP 119/74 (  BP Location: Left Arm)   Pulse 87   Temp 98 F (36.7 C) (Oral)   SpO2 99%   Physical Exam  Constitutional: He is oriented to person, place, and time. He appears well-developed and well-nourished. No distress.  HENT:  Head: Normocephalic and atraumatic.  Eyes: Conjunctivae are normal.  Neck: Neck supple.  Cardiovascular: Normal rate, regular rhythm, normal heart sounds and intact distal pulses.  No murmur heard. Pulmonary/Chest: Effort normal and breath sounds normal.  Abdominal: There is no tenderness.  Musculoskeletal:  No TTP to the cervical, thoracic, or lumbar spine. TTP along the sacrum and coccyx.  Neurological: He is alert and oriented to person, place, and time.  Motor:   Normal tone. 5/5 strength of BUE and BLE major muscle groups including strong and equal grip strength and dorsiflexion/plantar flexion Sensory: light touch normal in all extremities. Gait: normal gait and balance. Able to walk on toes and heels with ease.  CV: 2+ radial and DP/PT pulses  Skin: Skin is warm and dry. Capillary refill takes less than 2 seconds.   ED Treatments / Results  Labs (all labs ordered are listed, but only abnormal results are displayed) Labs Reviewed - No data to display  EKG None  Radiology Dg Pelvis 1-2 Views  Result Date: 10/16/2017 CLINICAL DATA:  Pt fell one week ago and landed on his tail bone. Pt c/o tail bone pain and states it hurts when he sits and moves. Repeated pelvis xray due to coins. Coins were removed but more coins fell into repeat xray. EXAM: PELVIS - 1-2 VIEW COMPARISON:  05/28/2010 FINDINGS: There is no evidence of pelvic fracture or diastasis. No pelvic bone lesions are seen. IMPRESSION: Negative. Electronically Signed   By: Nolon Nations M.D.   On: 10/16/2017 14:56   Dg Sacrum/coccyx  Result Date: 10/16/2017 CLINICAL DATA:  Fall.  Landed on tailbone. EXAM: SACRUM AND COCCYX - 2+ VIEW COMPARISON:  10/16/2017 FINDINGS: There is no evidence of fracture or other focal bone lesions. IMPRESSION: Negative. Electronically Signed   By: Kerby Moors M.D.   On: 10/16/2017 15:05    Procedures Procedures (including critical care time)  Medications Ordered in ED Medications - No data to display   Initial Impression / Assessment and Plan / ED Course  I have reviewed the triage vital signs and the nursing notes.  Pertinent labs & imaging results that were available during my care of the patient were reviewed by me and considered in my medical decision making (see chart for details).     Final Clinical Impressions(s) / ED Diagnoses   Final diagnoses:  Acute midline low back pain without sciatica   Patient with coccyx/sacral pain after a  mechanical fall.  No neurological deficits and normal neuro exam.  Patient can walk but states is painful. No red flag signs or symptoms suggesting cauda equina, myelopathy, discitis, or spinal epidural abscess including no urinary or bowel incontinence, no urinary retention, no weakness/numbness to BLE, no saddle anesthesia, no fevers or weight loss, and no h/o CA or IVDU. Xray sacrum/coccyx and pelvis negative for any acute abnormalities. Marland Kitchen RICE protocol and pain medicine indicated and discussed with patient.    ED Discharge Orders    None       Bishop Dublin 10/16/17 1513    Quintella Reichert, MD 10/16/17 [phone]

## 2018-03-09 ENCOUNTER — Other Ambulatory Visit: Payer: Self-pay

## 2018-03-09 ENCOUNTER — Encounter (HOSPITAL_COMMUNITY): Payer: Self-pay | Admitting: *Deleted

## 2018-03-09 ENCOUNTER — Inpatient Hospital Stay (HOSPITAL_COMMUNITY)
Admission: AD | Admit: 2018-03-09 | Discharge: 2018-03-18 | DRG: 885 | Disposition: A | Discharge status: Home / Self Care | Payer: Medicare Other | Source: Intra-hospital | Attending: Psychiatry | Admitting: Psychiatry

## 2018-03-09 ENCOUNTER — Encounter (HOSPITAL_COMMUNITY): Payer: Self-pay | Admitting: Emergency Medicine

## 2018-03-09 ENCOUNTER — Emergency Department (HOSPITAL_COMMUNITY)
Admission: EM | Admit: 2018-03-09 | Discharge: 2018-03-09 | Disposition: A | Discharge status: Other Acute Inpatient Hospital | Payer: Medicare Other | Source: Home / Self Care | Attending: Emergency Medicine | Admitting: Emergency Medicine

## 2018-03-09 DIAGNOSIS — R45851 Suicidal ideations: Secondary | ICD-10-CM | POA: Diagnosis present

## 2018-03-09 DIAGNOSIS — Z79899 Other long term (current) drug therapy: Secondary | ICD-10-CM

## 2018-03-09 DIAGNOSIS — F251 Schizoaffective disorder, depressive type: Principal | ICD-10-CM

## 2018-03-09 DIAGNOSIS — F17210 Nicotine dependence, cigarettes, uncomplicated: Secondary | ICD-10-CM | POA: Diagnosis present

## 2018-03-09 DIAGNOSIS — F17290 Nicotine dependence, other tobacco product, uncomplicated: Secondary | ICD-10-CM

## 2018-03-09 DIAGNOSIS — F1420 Cocaine dependence, uncomplicated: Secondary | ICD-10-CM | POA: Diagnosis present

## 2018-03-09 DIAGNOSIS — Z915 Personal history of self-harm: Secondary | ICD-10-CM | POA: Insufficient documentation

## 2018-03-09 DIAGNOSIS — F209 Schizophrenia, unspecified: Secondary | ICD-10-CM | POA: Insufficient documentation

## 2018-03-09 DIAGNOSIS — F329 Major depressive disorder, single episode, unspecified: Secondary | ICD-10-CM

## 2018-03-09 DIAGNOSIS — F515 Nightmare disorder: Secondary | ICD-10-CM | POA: Diagnosis present

## 2018-03-09 DIAGNOSIS — R440 Auditory hallucinations: Secondary | ICD-10-CM

## 2018-03-09 HISTORY — DX: Headache: R51

## 2018-03-09 HISTORY — DX: Headache, unspecified: R51.9

## 2018-03-09 HISTORY — DX: Anxiety disorder, unspecified: F41.9

## 2018-03-09 LAB — URINALYSIS, ROUTINE W REFLEX MICROSCOPIC
Bacteria, UA: NONE SEEN
Bilirubin Urine: NEGATIVE
GLUCOSE, UA: NEGATIVE mg/dL
Ketones, ur: NEGATIVE mg/dL
LEUKOCYTES UA: NEGATIVE
Nitrite: NEGATIVE
PH: 6 (ref 5.0–8.0)
PROTEIN: NEGATIVE mg/dL
Specific Gravity, Urine: 1.008 (ref 1.005–1.030)

## 2018-03-09 LAB — CBC WITH DIFFERENTIAL/PLATELET
Abs Immature Granulocytes: 0.09 10*3/uL — ABNORMAL HIGH (ref 0.00–0.07)
Basophils Absolute: 0.1 10*3/uL (ref 0.0–0.1)
Basophils Relative: 1 %
EOS ABS: 0.3 10*3/uL (ref 0.0–0.5)
EOS PCT: 2 %
HEMATOCRIT: 52.3 % — AB (ref 39.0–52.0)
Hemoglobin: 17 g/dL (ref 13.0–17.0)
IMMATURE GRANULOCYTES: 1 %
LYMPHS ABS: 2.3 10*3/uL (ref 0.7–4.0)
Lymphocytes Relative: 16 %
MCH: 30.7 pg (ref 26.0–34.0)
MCHC: 32.5 g/dL (ref 30.0–36.0)
MCV: 94.6 fL (ref 80.0–100.0)
MONOS PCT: 6 %
Monocytes Absolute: 0.9 10*3/uL (ref 0.1–1.0)
NEUTROS PCT: 74 %
Neutro Abs: 10.4 10*3/uL — ABNORMAL HIGH (ref 1.7–7.7)
PLATELETS: 281 10*3/uL (ref 150–400)
RBC: 5.53 MIL/uL (ref 4.22–5.81)
RDW: 15.1 % (ref 11.5–15.5)
WBC: 14 10*3/uL — ABNORMAL HIGH (ref 4.0–10.5)
nRBC: 0 % (ref 0.0–0.2)

## 2018-03-09 LAB — RAPID URINE DRUG SCREEN, HOSP PERFORMED
AMPHETAMINES: NOT DETECTED
BARBITURATES: NOT DETECTED
BENZODIAZEPINES: NOT DETECTED
Cocaine: POSITIVE — AB
Opiates: NOT DETECTED
Tetrahydrocannabinol: NOT DETECTED

## 2018-03-09 LAB — BASIC METABOLIC PANEL
Anion gap: 11 (ref 5–15)
BUN: 11 mg/dL (ref 6–20)
CALCIUM: 9 mg/dL (ref 8.9–10.3)
CO2: 22 mmol/L (ref 22–32)
CREATININE: 0.78 mg/dL (ref 0.61–1.24)
Chloride: 103 mmol/L (ref 98–111)
GFR calc Af Amer: >60 mL/min (ref >60)
GLUCOSE: 141 mg/dL — AB (ref 70–99)
Potassium: 3.6 mmol/L (ref 3.5–5.1)
Sodium: 136 mmol/L (ref 135–145)

## 2018-03-09 LAB — ETHANOL: Alcohol, Ethyl (B): <10 mg/dL (ref <10)

## 2018-03-09 LAB — ACETAMINOPHEN LEVEL: Acetaminophen (Tylenol), Serum: <10 ug/mL — ABNORMAL LOW (ref 10–30)

## 2018-03-09 LAB — SALICYLATE LEVEL

## 2018-03-09 MED ORDER — BENZTROPINE MESYLATE 0.5 MG PO TABS
ORAL_TABLET | ORAL | Status: AC
  Administered 2018-03-09: 0.5 mg via ORAL
  Filled 2018-03-09: qty 1

## 2018-03-09 MED ORDER — DOUBLE ANTIBIOTIC 500-10000 UNIT/GM EX OINT
TOPICAL_OINTMENT | Freq: Every day | CUTANEOUS | Status: DC | PRN
  Filled 2018-03-09: qty 28.4

## 2018-03-09 MED ORDER — MAGNESIUM HYDROXIDE 400 MG/5ML PO SUSP: 30.0000 mL | Freq: Every day | ORAL | Status: DC | PRN

## 2018-03-09 MED ORDER — TRAZODONE HCL 50 MG PO TABS
ORAL_TABLET | ORAL | Status: AC
  Administered 2018-03-09: 50 mg via ORAL
  Filled 2018-03-09: qty 1

## 2018-03-09 MED ORDER — TRAZODONE HCL 50 MG PO TABS
50.0000 mg | ORAL_TABLET | Freq: Every day | ORAL | Status: DC
  Administered 2018-03-09 – 2018-03-10 (×2): 50 mg via ORAL
  Filled 2018-03-09 (×4): qty 1

## 2018-03-09 MED ORDER — HALOPERIDOL 5 MG PO TABS
5.0000 mg | ORAL_TABLET | ORAL | Status: AC
  Administered 2018-03-09: 5 mg via ORAL
  Filled 2018-03-09: qty 1

## 2018-03-09 MED ORDER — PRAZOSIN HCL 1 MG PO CAPS
1.0000 mg | ORAL_CAPSULE | Freq: Every day | ORAL | Status: DC
  Filled 2018-03-09 (×3): qty 1

## 2018-03-09 MED ORDER — NICOTINE 7 MG/24HR TD PT24
7.0000 mg | MEDICATED_PATCH | Freq: Every day | TRANSDERMAL | Status: DC
  Filled 2018-03-09 (×2): qty 1

## 2018-03-09 MED ORDER — BENZTROPINE MESYLATE 0.5 MG PO TABS
0.5000 mg | ORAL_TABLET | Freq: Every day | ORAL | Status: DC
  Administered 2018-03-09 – 2018-03-17 (×9): 0.5 mg via ORAL
  Filled 2018-03-09 (×11): qty 1

## 2018-03-09 MED ORDER — BENZTROPINE MESYLATE 0.5 MG PO TABS: 0.5000 mg | ORAL_TABLET | Freq: Every day | ORAL | Status: DC

## 2018-03-09 MED ORDER — ONDANSETRON HCL 4 MG PO TABS: 4.0000 mg | ORAL_TABLET | Freq: Three times a day (TID) | ORAL | Status: DC | PRN

## 2018-03-09 MED ORDER — BACITRACIN ZINC 500 UNIT/GM EX OINT: TOPICAL_OINTMENT | Freq: Every day | CUTANEOUS | Status: DC | PRN

## 2018-03-09 MED ORDER — ALUM & MAG HYDROXIDE-SIMETH 200-200-20 MG/5ML PO SUSP
30.0000 mL | ORAL | Status: DC | PRN
  Administered 2018-03-13 – 2018-03-17 (×2): 30 mL via ORAL
  Filled 2018-03-09 (×2): qty 30

## 2018-03-09 MED ORDER — LORAZEPAM 1 MG PO TABS
1.0000 mg | ORAL_TABLET | ORAL | Status: AC
  Administered 2018-03-09: 1 mg via ORAL

## 2018-03-09 MED ORDER — LORAZEPAM 1 MG PO TABS
ORAL_TABLET | ORAL | Status: AC
  Administered 2018-03-09: 1 mg via ORAL
  Filled 2018-03-09: qty 1

## 2018-03-09 MED ORDER — TRAZODONE HCL 50 MG PO TABS: 50.0000 mg | ORAL_TABLET | Freq: Every day | ORAL | Status: DC

## 2018-03-09 MED ORDER — ALUM & MAG HYDROXIDE-SIMETH 200-200-20 MG/5ML PO SUSP: 30.0000 mL | Freq: Four times a day (QID) | ORAL | Status: DC | PRN

## 2018-03-09 MED ORDER — FLUOXETINE HCL 20 MG PO CAPS
40.0000 mg | ORAL_CAPSULE | Freq: Every day | ORAL | Status: DC
  Administered 2018-03-10: 40 mg via ORAL
  Filled 2018-03-09 (×2): qty 2

## 2018-03-09 MED ORDER — HALOPERIDOL 5 MG PO TABS
ORAL_TABLET | ORAL | Status: AC
  Filled 2018-03-09: qty 1

## 2018-03-09 MED ORDER — FLUOXETINE HCL 20 MG PO CAPS
40.0000 mg | ORAL_CAPSULE | Freq: Every day | ORAL | Status: DC
  Administered 2018-03-09: 40 mg via ORAL
  Filled 2018-03-09: qty 2

## 2018-03-09 MED ORDER — NICOTINE 7 MG/24HR TD PT24
7.0000 mg | MEDICATED_PATCH | Freq: Every day | TRANSDERMAL | Status: DC
  Filled 2018-03-09: qty 1

## 2018-03-09 MED ORDER — ACETAMINOPHEN 325 MG PO TABS: 650.0000 mg | ORAL_TABLET | ORAL | Status: DC | PRN

## 2018-03-09 MED ORDER — ACETAMINOPHEN 325 MG PO TABS
650.0000 mg | ORAL_TABLET | Freq: Four times a day (QID) | ORAL | Status: DC | PRN
  Administered 2018-03-10 – 2018-03-16 (×4): 650 mg via ORAL
  Filled 2018-03-09 (×5): qty 2

## 2018-03-09 NOTE — BH Assessment (Signed)
[facility] Assessment Progress Note  Per Buford Dresser, DO, this pt requires psychiatric hospitalization at this time.  Letitia Libra, RN, [facility] has assigned pt to [facility] Rm [id number]; [place] will be ready to receive pt at 20:00.  This pt is under the guardianship of his brother, Rodell Marrs [phone]); letter of guardianship may be found in EPIC under the Media tab.  At 11:40 I called him to notify him of pt's disposition.  He verbally agrees to pt being hospitalized, and indicates that he will be at [facility] around 12:10.  He later arrives at [facility], and has signed Voluntary Admission and Consent for Treatment, as well as Consent to Release Information to [facility] and to pt's other brother.  A notification call has been placed to [facility].  Signed forms have been faxed to [facility].  Pt's nurse, Nena Jordan, has been notified, and agrees to send original paperwork along with pt via [facility], and to call report to [phone].  Jalene Mullet, [place] Coordinator [phone]

## 2018-03-09 NOTE — BHH Counselor (Signed)
   Assessment Disposition:   Case discussed with [facility] provider, Lindon Romp, FNP who recommends inpatient treatment.  [facility] inform ER provider, Dr. Roxanne Mins of the recommended disposition. TTS will look for placement.  Destanee Bedonie L. Reann Dobias, MS, [place], [facility] Therapeutic Triage Specialist  [phone]

## 2018-03-09 NOTE — ED Provider Notes (Signed)
[facility] DEPT Provider Note   CSN: [id number] Arrival date & time: 03/09/18  0405     History   Chief Complaint Chief Complaint  Patient presents with  . Emesis  . Hearing voices    HPI [name] is a 37 y.o. male.  The history is provided by the patient.  He has a history of schizophrenia and comes in because of hallucinations.  He states that for the last 3 nights, he has had difficulty sleeping.  He started hearing voices last night telling him to harm himself.  He states he has been compliant with his medications.  He does admit to drinking 1 beer yesterday, and also admits to cocaine and marijuana use.  He has had suicidal thoughts of jumping in front of a train.  He denies homicidal ideation.  He has had crying spells, early morning wakening, and and anhedonia.  History reviewed. No pertinent past medical history.  Patient Active Problem List   Diagnosis Date Noted  . Schizophrenia, paranoid type ([place]) 10/11/2017  . Overdose 03/05/2015  . Seizures ([place])   . Tachycardia   . Right shoulder pain     Past Surgical History:  Procedure Laterality Date  . APPENDECTOMY    . TIBIA FRACTURE SURGERY     right        Home Medications    Prior to Admission medications   Medication Sig Start Date End Date Taking? Authorizing Provider  benztropine (COGENTIN) 0.5 MG tablet Take 0.5 mg by mouth at bedtime. 10/15/17   [provider]  FLUoxetine (PROZAC) 40 MG capsule Take 40 mg by mouth daily. 10/15/17   [provider]  hydrOXYzine (ATARAX/VISTARIL) 50 MG tablet Take 50 mg by mouth at bedtime.    [provider]  paliperidone (INVEGA SUSTENNA) 156 MG/ML SUSP injection Inject 156 mg into the muscle every 3 (three) months.     [provider]  RaNITidine HCl (ZANTAC PO) Take 1 tablet by mouth daily as needed (heartburn).    [provider]  traZODone (DESYREL) 50 MG tablet Take 50 mg by mouth at  bedtime. 10/15/17   [provider]    Family History No family history on file.  Social History Social History   Tobacco Use  . Smoking status: Current Some Day Smoker    Types: Cigars  Substance Use Topics  . Alcohol use: Yes    Comment: weekly  . Drug use: Yes    Types: Marijuana    Comment: hx 2015 cocaine     Allergies   Patient has no known allergies.   Review of Systems Review of Systems  All other systems reviewed and are negative.    Physical Exam Updated Vital Signs BP 140/81 (BP Location: Left Arm)   Pulse 91   Temp 97.7 F (36.5 C) (Oral)   Resp 20   Ht 5\' 9"  (1.753 m)   Wt 82.1 kg   SpO2 100%   BMI 26.73 kg/m   Physical Exam  Nursing note and vitals reviewed.  37 year old male, resting comfortably and in no acute distress. Vital signs are normal. Oxygen saturation is 100%, which is normal. Head is normocephalic and atraumatic. PERRLA, EOMI. Oropharynx is clear. Neck is nontender and supple without adenopathy or JVD. Back is nontender and there is no CVA tenderness. Lungs are clear without rales, wheezes, or rhonchi. Chest is nontender. Heart has regular rate and rhythm without murmur. Abdomen is soft, flat, nontender  without masses or hepatosplenomegaly and peristalsis is normoactive. Extremities have no cyanosis or edema, full range of motion is present. Skin is warm and dry without rash. Neurologic: Mental status is normal, cranial nerves are intact, there are no motor or sensory deficits.  ED Treatments / Results  Labs (all labs ordered are listed, but only abnormal results are displayed) Labs Reviewed  CBC WITH DIFFERENTIAL/PLATELET - Abnormal; Notable for the following components:      Result Value   WBC 14.0 (*)    HCT 52.3 (*)    Neutro Abs 10.4 (*)    Abs Immature Granulocytes 0.09 (*)    All other components within normal limits  BASIC METABOLIC PANEL - Abnormal; Notable for the following components:   Glucose, Bld  141 (*)    All other components within normal limits  ACETAMINOPHEN LEVEL - Abnormal; Notable for the following components:   Acetaminophen (Tylenol), Serum <10 (*)    All other components within normal limits  URINALYSIS, ROUTINE W REFLEX MICROSCOPIC - Abnormal; Notable for the following components:   Hgb urine dipstick SMALL (*)    All other components within normal limits  RAPID URINE DRUG SCREEN, HOSP PERFORMED - Abnormal; Notable for the following components:   Cocaine POSITIVE (*)    All other components within normal limits  ETHANOL  SALICYLATE LEVEL   Procedures Procedures   Medications Ordered in ED Medications  nicotine (NICODERM CQ - dosed in mg/24 hr) patch 7 mg (has no administration in time range)  alum & mag hydroxide-simeth (MAALOX/MYLANTA) 200-200-20 MG/5ML suspension 30 mL (has no administration in time range)  ondansetron (ZOFRAN) tablet 4 mg (has no administration in time range)  acetaminophen (TYLENOL) tablet 650 mg (has no administration in time range)  benztropine (COGENTIN) tablet 0.5 mg (has no administration in time range)  FLUoxetine (PROZAC) capsule 40 mg (has no administration in time range)  traZODone (DESYREL) tablet 50 mg (has no administration in time range)     Initial Impression / Assessment and Plan / ED Course  I have reviewed the triage vital signs and the nursing notes.  Pertinent lab results that were available during my care of the patient were reviewed by me and considered in my medical decision making (see chart for details).  Apparent exacerbation of schizophrenia with command hallucinations and suicidal ideation.  Screening labs are obtained, and he will be placed in psychiatric holding.  TTS evaluation is requested.  Old records are reviewed confirming several ED visits and hospitalizations related to schizophrenia.  Labs are unremarkable.  TTS consultation is appreciated.  Patient needs inpatient psychiatric care, no bed available at  [facility].  He will be held in the ED pending appropriate psychiatric placement.  Final Clinical Impressions(s) / ED Diagnoses   Final diagnoses:  Suicidal ideations  Auditory hallucinations  Schizophrenia, unspecified type [facility])    ED Discharge Orders    None       Delora Fuel, MD 66/06/30 [phone]

## 2018-03-09 NOTE — ED Triage Notes (Signed)
Pt arriving with complaint of N/V/D, headache, back pain, and auditory hallucinations. Pt reports symptoms began this morning. Pt reports the voices that he is hearing are telling him to harm himself. Pt is on medication for schizophrenia.

## 2018-03-09 NOTE — ED Notes (Signed)
RN [place], [facility] accepted report.  Pending [facility] transport.

## 2018-03-09 NOTE — ED Notes (Signed)
Pt sleeping at present., no distress noted, calm & cooperative.  Pending report to [facility] and [facility] transport at approximately 9pm.

## 2018-03-09 NOTE — ED Notes (Signed)
Pt oriented to room and unit.  Pt is calm and cooperative.  Pt c/o worsening audio hallucinations.  Pt denies nausea and vomiting.  Pt continues to feel suicidal.  15 minute checks and video monitoring in place.

## 2018-03-09 NOTE — Tx Team (Signed)
Initial Treatment Plan 03/09/2018 10:34 PM [name] [id number]    PATIENT STRESSORS: Financial difficulties Substance abuse   PATIENT STRENGTHS: Ability for insight Average or above average intelligence Capable of independent living General fund of knowledge Motivation for treatment/growth Supportive family/friends   PATIENT IDENTIFIED PROBLEMS: Depression Substance Abuse Suicidal thoughts Hallucinations "I want to get rid of the voices, shadows and suicidal thoughts"                     DISCHARGE CRITERIA:  Ability to meet basic life and health needs Improved stabilization in mood, thinking, and/or behavior Reduction of life-threatening or endangering symptoms to within safe limits Verbal commitment to aftercare and medication compliance  PRELIMINARY DISCHARGE PLAN: Attend aftercare/continuing care group Return to previous living arrangement  PATIENT/FAMILY INVOLVEMENT: This treatment plan has been presented to and reviewed with the patient, [name], and/or family member, .  The patient and family have been given the opportunity to ask questions and make suggestions.  [place], [place], [place] 03/09/2018, 10:34 PM

## 2018-03-09 NOTE — Progress Notes (Signed)
[name] is a 37 year old male pt admitted on voluntary basis. On admission, he reports that he has been feeling depressed and suicidal and reports that he has been hearing voices the past few days telling him to kill himself. He endorses daily crack use and occasional marijuana and alcohol. He does endorse passive SI but is able to contract for safety on the unit. He reports that he has been hospitalized in the past but does not remember when or where. He reports that he goes to [place] and gets an [place] shot every 3 months and reports his next shot is due in January. He reports that he lives with his mother and reports that he will return there once he is discharged. Eldrick was escorted to the unit, oriented to the milieu and safety maintained.

## 2018-03-09 NOTE — ED Notes (Signed)
Bed: [facility]3 Expected date:  Expected time:  Means of arrival:  Comments: 
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
## 2018-03-10 DIAGNOSIS — F251 Schizoaffective disorder, depressive type: Secondary | ICD-10-CM

## 2018-03-10 MED ORDER — FLUOXETINE HCL 20 MG PO CAPS
60.0000 mg | ORAL_CAPSULE | Freq: Every day | ORAL | Status: DC
  Administered 2018-03-11 – 2018-03-18 (×8): 60 mg via ORAL
  Filled 2018-03-10 (×10): qty 3

## 2018-03-10 MED ORDER — CARIPRAZINE HCL 3 MG PO CAPS
3.0000 mg | ORAL_CAPSULE | Freq: Every day | ORAL | Status: DC
  Administered 2018-03-10 – 2018-03-18 (×9): 3 mg via ORAL
  Filled 2018-03-10 (×11): qty 1

## 2018-03-10 MED ORDER — PRAZOSIN HCL 1 MG PO CAPS
1.0000 mg | ORAL_CAPSULE | Freq: Every day | ORAL | Status: DC
  Administered 2018-03-10 – 2018-03-17 (×8): 1 mg via ORAL
  Filled 2018-03-10 (×10): qty 1

## 2018-03-10 MED ORDER — NICOTINE POLACRILEX 2 MG MT GUM
2.0000 mg | CHEWING_GUM | OROMUCOSAL | Status: DC | PRN
  Administered 2018-03-10 – 2018-03-15 (×4): 2 mg via ORAL
  Filled 2018-03-10 (×2): qty 1

## 2018-03-10 NOTE — Progress Notes (Signed)
D:  [name] is currently resting with his eyes closed in bed and appears to be asleep.  Breathing even and unlabored.   A:  1:1 continues for safety with sitter by side.   R:  [name] remains safe on the unit.  We will continue to monitor.

## 2018-03-10 NOTE — Progress Notes (Signed)
1:1   Patient is currently resting in bed with his eyes open.  Sitter is within reach. Breathing is even and unlabored, patient is in no current distress.  Patient voiced to writer he was still actively suicidal and could not contract for safety. He admitted to thinking of ways he would kill himself in his room, one being breaking the glass on the light fixture and cutting his wrists. Will continue to monitor and provide support.
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
## 2018-03-10 NOTE — Tx Team (Signed)
Interdisciplinary Treatment and Diagnostic Plan Update  03/11/2018 Time of Session: [place] MRN: [id number]  Principal Diagnosis: Schizophrenia  Secondary Diagnoses: Active Problems:   Schizophrenia ([place])   Schizoaffective disorder, depressive type ([place])   Current Medications:  Current Facility-Administered Medications  Medication Dose Route Frequency Provider Last Rate Last Dose  . acetaminophen (TYLENOL) tablet 650 mg  650 mg Oral Q6H PRN Ethelene Hal, NP   650 mg at 03/11/18 1018  . alum & mag hydroxide-simeth (MAALOX/MYLANTA) 200-200-20 MG/5ML suspension 30 mL  30 mL Oral Q4H PRN Ethelene Hal, NP      . bacitracin ointment   Topical Daily PRN Ethelene Hal, NP      . benztropine (COGENTIN) tablet 0.5 mg  0.5 mg Oral QHS Ethelene Hal, NP   0.5 mg at 03/10/18 2107  . cariprazine (VRAYLAR) capsule 3 mg  3 mg Oral Daily Johnn Hai, MD   3 mg at 03/11/18 0844  . FLUoxetine (PROZAC) capsule 60 mg  60 mg Oral Daily Johnn Hai, MD   60 mg at 03/11/18 0844  . magnesium hydroxide (MILK OF MAGNESIA) suspension 30 mL  30 mL Oral Daily PRN Ethelene Hal, NP      . nicotine polacrilex (NICORETTE) gum 2 mg  2 mg Oral PRN Sharma Covert, MD   2 mg at 03/11/18 1106  . ondansetron (ZOFRAN) tablet 4 mg  4 mg Oral Q8H PRN Ethelene Hal, NP      . prazosin (MINIPRESS) capsule 1 mg  1 mg Oral QHS Sharma Covert, MD   1 mg at 03/10/18 2107  . traZODone (DESYREL) tablet 50 mg  50 mg Oral QHS Ethelene Hal, NP   50 mg at 03/10/18 2107   PTA Medications: Medications Prior to Admission  Medication Sig Dispense Refill Last Dose  . benztropine (COGENTIN) 0.5 MG tablet Take 0.5 mg by mouth at bedtime.   Past Month at Unknown time  . FLUoxetine (PROZAC) 40 MG capsule Take 40 mg by mouth daily.   Past Week at Unknown time  . INVEGA TRINZA 546 MG/1.75ML SUSY Inject 546 mg into the skin every 3 (three) months.   Past Month at  Unknown time  . prazosin (MINIPRESS) 1 MG capsule Take 1 mg by mouth daily.   Past Week at Unknown time  . traZODone (DESYREL) 50 MG tablet Take 50 mg by mouth at bedtime.   Past Week at Unknown time    Patient Stressors: Financial difficulties Substance abuse  Patient Strengths: Ability for insight Average or above average intelligence Capable of independent living [employer] of knowledge Motivation for treatment/growth Supportive family/friends  Treatment Modalities: Medication Management, Group therapy, Case management,  1 to 1 session with clinician, Psychoeducation, Recreational therapy.   Physician Treatment Plan for Primary Diagnosis: Schizophrenia Long Term Goal(s): Improvement in symptoms so as ready for discharge Improvement in symptoms so as ready for discharge   Short Term Goals: Ability to identify changes in lifestyle to reduce recurrence of condition will improve Ability to verbalize feelings will improve Ability to demonstrate self-control will improve Ability to identify and develop effective coping behaviors will improve Ability to identify changes in lifestyle to reduce recurrence of condition will improve Ability to verbalize feelings will improve  Medication Management: Evaluate patient's response, side effects, and tolerance of medication regimen.  Therapeutic Interventions: 1 to 1 sessions, Unit Group sessions and Medication administration.  Evaluation of Outcomes: Progressing  Physician Treatment Plan  for Secondary Diagnosis: Active Problems:   Schizophrenia ([place])   Schizoaffective disorder, depressive type ([place])  Long Term Goal(s): Improvement in symptoms so as ready for discharge Improvement in symptoms so as ready for discharge   Short Term Goals: Ability to identify changes in lifestyle to reduce recurrence of condition will improve Ability to verbalize feelings will improve Ability to demonstrate self-control will improve Ability to  identify and develop effective coping behaviors will improve Ability to identify changes in lifestyle to reduce recurrence of condition will improve Ability to verbalize feelings will improve     Medication Management: Evaluate patient's response, side effects, and tolerance of medication regimen.  Therapeutic Interventions: 1 to 1 sessions, Unit Group sessions and Medication administration.  Evaluation of Outcomes: Progressing   RN Treatment Plan for Primary Diagnosis: Schizophrenia Long Term Goal(s): Knowledge of disease and therapeutic regimen to maintain health will improve  Short Term Goals: Ability to remain free from injury will improve, Ability to verbalize feelings will improve and Ability to disclose and discuss suicidal ideas  Medication Management: RN will administer medications as ordered by provider, will assess and evaluate patient's response and provide education to patient for prescribed medication. RN will report any adverse and/or side effects to prescribing provider.  Therapeutic Interventions: 1 on 1 counseling sessions, Psychoeducation, Medication administration, Evaluate responses to treatment, Monitor vital signs and CBGs as ordered, Perform/monitor CIWA, COWS, AIMS and Fall Risk screenings as ordered, Perform wound care treatments as ordered.  Evaluation of Outcomes: Progressing   LCSW Treatment Plan for Primary Diagnosis:Schizophrenia Long Term Goal(s): Safe transition to appropriate next level of care at discharge, Engage patient in therapeutic group addressing interpersonal concerns.  Short Term Goals: Engage patient in aftercare planning with referrals and resources, Facilitate patient progression through stages of change regarding substance use diagnoses and concerns and Identify triggers associated with mental health/substance abuse issues  Therapeutic Interventions: Assess for all discharge needs, 1 to 1 time with Social worker, Explore available resources  and support systems, Assess for adequacy in community support network, Educate family and significant other(s) on suicide prevention, Complete Psychosocial Assessment, Interpersonal group therapy.  Evaluation of Outcomes: Progressing   Progress in Treatment: Attending groups: Yes. Participating in groups: Yes. Taking medication as prescribed: Yes. Toleration medication: Yes. Family/Significant other contact made: Yes, individual(s) contacted:  pt's brother/guardian to complete SPE and obtain collateral information Patient understands diagnosis: Yes. Discussing patient identified problems/goals with staff: Yes. Medical problems stabilized or resolved: Yes. Denies suicidal/homicidal ideation: No--passive SI/able to contract for safety on the unit.  Issues/concerns per patient self-inventory: No. Other: n/a   New problem(s) identified: No, Describe:  n/a  New Short Term/Long Term Goal(s): detox, elimination of AVH, medication management for mood stabilization; elimination of SI thoughts; development of comprehensive mental wellness/sobriety plan.   Patient Goals:  "I want to get rid of the voices and stop using crack."   Discharge Plan or Barriers: Pt open to [facility] referral and [place] referral.  Per pt's guardian/brother, he would like pt to be referred to [facility] and is able to provide transportation if accepted. CSW assessing.   Reason for Continuation of Hospitalization: Anxiety Depression Hallucinations Medication stabilization Withdrawal symptoms  Estimated Length of Stay: Tuesday, 03/15/18  Attendees: Patient: 03/11/2018 11:20 AM  Physician: Dr. Jake Samples MD 03/11/2018 11:20 AM  Nursing: Thomos Lemons; Patrice RN 03/11/2018 11:20 AM  RN Care Manager:x 03/11/2018 11:20 AM  Social Worker: Janice Norrie LCSW 03/11/2018 11:20 AM  Recreational Therapist: x  03/11/2018 11:20 AM  Other: Lindell Spar NP 03/11/2018 11:20 AM  Other:  03/11/2018 11:20 AM  Other: 03/11/2018  11:20 AM    Scribe for Treatment Team: Avelina Laine, LCSW 03/11/2018 11:20 AM

## 2018-03-10 NOTE — Progress Notes (Signed)
D:  [name] did get up to take his hs medications.  He continues to voice suicidal ideation and will harm himself he does not have staff present.  He continues to hear voices that are bothersome and tell him to harm himself.  He is currently resting quietly in his room and appears to be asleep. A:  1:1 remains with sitter by his side.   R:  Kowen remains safe on the unit.  We will continue to monitor.

## 2018-03-10 NOTE — Plan of Care (Signed)
Patient was initiated on 1:1 due to not being able to contract for safety. Patient remains safe on the unit.  Problem: Activity: Goal: Interest or engagement in activities will improve Outcome: Progressing Goal: Sleeping patterns will improve Outcome: Progressing   Problem: Coping: Goal: Ability to verbalize frustrations and anger appropriately will improve Outcome: Progressing   Problem: Safety: Goal: Periods of time without injury will increase Outcome: Progressing   Problem: Activity: Goal: Will verbalize the importance of balancing activity with adequate rest periods Outcome: Progressing   Problem: Coping: Goal: Ability to demonstrate self-control will improve Outcome: Not Progressing

## 2018-03-10 NOTE — Progress Notes (Signed)
Psychoeducational Group Note  Date:  03/10/2018 Time:  2045  Group Topic/Focus:  wrap up group  Participation Level: Did Not Attend  Participation Quality:  Not Applicable  Affect:  Not Applicable  Cognitive:  Not Applicable  Insight:  Not Applicable  Engagement in Group: Not Applicable  Additional Comments:  Pt remained in bed during group time.   Winfield Rast S 03/10/2018, 11:08 PM

## 2018-03-10 NOTE — Progress Notes (Signed)
Patient approached staff and admitted he was trying to find ways to kill or hurt himself while on the unit. He told staff he was hearing command voices. Patient refused to contract for safety, so 1:1 was initiated.  Patient is currently in the dayroom. Sitter is within arms reach.  Patient is in no current distress, respirations are even and unlabored. Will continue to monitor and provide support.

## 2018-03-10 NOTE — [facility] Suicide Risk Assessment (Addendum)
[place] INPATIENT:  Family/Significant Other Suicide Prevention Education  Suicide Prevention Education:  Contact Attempts: [name] (pt's brother and legal guardian) [phone] has been identified by the patient as the family member/significant other with whom the patient will be residing, and identified as the person(s) who will aid the patient in the event of a mental health crisis.  With written consent from the patient, two attempts were made to provide suicide prevention education, prior to and/or following the patient's discharge.  We were unsuccessful in providing suicide prevention education.  A suicide education pamphlet was given to the patient to share with family/significant other.  Date and time of first attempt: 03/10/18, 11:20AM (unable to leave voicemail)   Avelina Laine LCSW 03/10/2018, 11:20 AM   CSW spoke with pt's brother and legal guardian regarding disposition. Pt's brother is requesting for pt to be referred to [facility] and is able to provide transportation if pt is accepted. 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 SPE and aftercare updates provided. He will come to [facility] at discharge and sign releases. He provided verbal consent for referral to be made on pt's behalf.   Keenon Leitzel S. Ouida Sills, MSW, LCSW Clinical Social Worker 03/10/2018 11:39 AM

## 2018-03-10 NOTE — Progress Notes (Signed)
D:  Report obtained from [facility].  [name] was pleasant on approach.  He continued to voice passive SI and will seek out staff if that worsens.  He continues to hear voices telling him to hurt himself.  He requested medication to help with sleep and for his voices.  Staff with Clinton Hamman PA, new orders noted and given along with his hs medications.  He denied any pain or discomfort and appeared to be in no physical distress.  He is currently resting with his eyes closed and appears to be asleep. A:  1:1 with RN for support and encouragement.  Medications as ordered.  Q 15 minute checks maintained for safety.  Encouraged participation in group and unit activities.   R:  [name] remains safe on the unit.  We will continue to monitor the progress towards his goals.

## 2018-03-10 NOTE — Progress Notes (Signed)
Pt attended goals and orientation group this morning. Pt said he was actively trying to think of ways to hurt himself while in the hospital. Pt refuses to contract for safety.

## 2018-03-10 NOTE — [facility] Group Notes (Signed)
Adult Nursing/MHT Psychoeducational Group Note  Date:  03/10/2018  Time: 4:00 PM  Group Topic/Focus: Personal Choices and Values The focus of this group is to help patients assess and explore the importance of values in their lives, how their values affect their decisions, how they express their values and what opposes their expression. Lead By: Valinda Hoar RN, and Audrie Lia, MHT  Participation Level:  Active  Participation Quality:  Appropriate and Attentive  Affect:  Appropriate  Cognitive:  Alert and Oriented  Insight: Improving  Engagement in Group:  Developing/Improving  Modes of Intervention:  Discussion and Education  Additional Comments:  The patients were provided a beach ball with various questions. Patients took turn tossing the ball and answering questions while engaging in socialization and discussion.   Estill Bamberg A Khya Halls 03/10/2018 5:00 PM

## 2018-03-10 NOTE — H&P (Signed)
Psychiatric Admission Assessment Adult  Patient Identification: [name] MRN:  [id number] Date of Evaluation:  03/10/2018 Chief Complaint:  Schizophrenia Cocaine use disorder Principal Diagnosis: <principal problem not specified> Diagnosis:  Active Problems:   Schizophrenia ([place])  History of Present Illness:   [name] is well-known to the service he is a 37 year old patient who reports to me exactly 4 attempts at harming himself in the past by overdose who states he called the crisis line and 911 because he felt suicidal.  He acknowledges cocaine dependency, acknowledges cannabis dependency, and some alcohol usage. At times he is reported auditory hallucinations he describes them to me is always inside not outside his head. There is an unusual incongruence despite him reporting he is depressed and suicidal he does smile and seemed very appropriate and euthymic in the interview. Stated his plans for overdose or jump off a bridge he further could contract for safety but after the interview reported to the nurses that he simply could not contract for safety and he was changed to 1-1 precautions. Apparently goes to [facility] and has received long-acting injectable paliperidone his next shot is due in January he states that no medications have ever really been helpful and he is always been suicidal however he acknowledges chronic substance abuse and in particular cocaine dependence.  States he does not work lives at home with his mother.  According to the initial evaluation in the emergency department  [name] is an 37 y.o. male who came to [facility] due to suicidal thoughts with a plan and auditory and visual hallucinations.  Pt stated "the voices are telling me to kill myself.  Today I was walking beside the railroad track and thought about jumping on the track to end it all including the voices."  Pt reports a decline in sleep since Sunday (2 hrs sleep) and "having dreams where I can remember  the voices and the shadows are getting more clear".  Pt admits to current substance use: Crack cocaine(2-3 grams daily); Cannabis (2 joints daily); and Alcohol (1 beer daily).  Pt reports relapsing 4-5 months ago.   Pt admits to multiple suicide attempts.  Pt states "I just wake up in the hospital because of an overdose. Sometimes, I take the whole bottle of pills because of the voices. I been in the hospital multiple times. I can't tell you when or where."  Pt reports being treated by [facility] every 3 month for years via injection and by Rx for his anxiety and nightmares. Pt reports taking his medications as prescribed.   Pt reports that he resides with his mother and can return home at discharge.  Pt reports having support from his mother and his siblings.  Pt reports not working.  Pt denies having a history of emotional, physical, sexual and verbal abuse.  Patient was wearing layered street clothes and appeared appropriately groomed.  Pt was alert throughout the assessment.  Patient made  fair eye contact and had normal psychomotor activity.  Patient spoke in a normal voice without pressured speech.  Pt expressed feeling anxious and depressed.  Pt's affect appeared dysphoric depressed, and congruent with stated mood. Pt's thought process was logical and coherent.  Pt presented with partial insight and judgement.  Pt did not appear to be responding to internal stimuli.  Pt was not able to contract for safety. Associated Signs/Symptoms: Depression Symptoms:  anhedonia, (Hypo) Manic Symptoms:  Impulsivity, Anxiety Symptoms:  Excessive Worry, Psychotic Symptoms:  Hallucinations: Auditory PTSD Symptoms:  NA Total Time spent with patient: 45 minutes  Past Psychiatric History:  As discussed his history is extensive he had a overdose in November 2016 that involved a seizure he stated he took 20 pills but stated he was not try to kill himself was just trying to enjoy the way the pills made him feel and  this prompted a seizure.  Based on the med list at that time it was most likely Wellbutrin. Is the patient at risk to self? Yes.    Has the patient been a risk to self in the past 6 months? Yes.    Has the patient been a risk to self within the distant past? Yes.    Is the patient a risk to others? No.  Has the patient been a risk to others in the past 6 months? No.  Has the patient been a risk to others within the distant past? No.   Prior Inpatient Therapy:   Prior Outpatient Therapy:    Alcohol Screening: 1. How often do you have a drink containing alcohol?: 2 to 4 times a month 2. How many drinks containing alcohol do you have on a typical day when you are drinking?: 1 or 2 3. How often do you have six or more drinks on one occasion?: Less than monthly AUDIT-C Score: 3 4. How often during the last year have you found that you were not able to stop drinking once you had started?: Never 5. How often during the last year have you failed to do what was normally expected from you becasue of drinking?: Never 6. How often during the last year have you needed a first drink in the morning to get yourself going after a heavy drinking session?: Never 7. How often during the last year have you had a feeling of guilt of remorse after drinking?: Never 8. How often during the last year have you been unable to remember what happened the night before because you had been drinking?: Never 9. Have you or someone else been injured as a result of your drinking?: No 10. Has a relative or friend or a doctor or another health worker been concerned about your drinking or suggested you cut down?: No Alcohol Use Disorder Identification Test Final Score (AUDIT): 3 Intervention/Follow-up: AUDIT Score <7 follow-up not indicated Substance Abuse History in the last 12 months:  Yes.   Consequences of Substance Abuse: Medical Consequences:  Seizure Previous Psychotropic Medications: Yes  Psychological Evaluations: Yes   Past Medical History:  Past Medical History:  Diagnosis Date  . Anxiety   . Headache     Past Surgical History:  Procedure Laterality Date  . APPENDECTOMY    . TIBIA FRACTURE SURGERY     right   Family History: History reviewed. No pertinent family history. Family Psychiatric  History:  Tobacco Screening: Have you used any form of tobacco in the last 30 days? (Cigarettes, Smokeless Tobacco, Cigars, and/or Pipes): Yes Tobacco use, Select all that apply: 5 or more cigarettes per day Are you interested in Tobacco Cessation Medications?: Yes, will notify MD for an order Counseled patient on smoking cessation including recognizing danger situations, developing coping skills and basic information about quitting provided: Refused/Declined practical counseling Social History:  Social History   Substance and Sexual Activity  Alcohol Use Yes   Comment: weekly     Social History   Substance and Sexual Activity  Drug Use Yes  . Types: Marijuana, "Crack" cocaine   Comment: hx 2015 cocaine  Additional Social History: Marital status: Single Are you sexually active?: No What is your sexual orientation?: heterosexual Has your sexual activity been affected by drugs, alcohol, medication, or emotional stress?: n/a  Does patient have children?: No                         Allergies:  No Known Allergies Lab Results:  Results for orders placed or performed during the hospital encounter of 03/09/18 (from the past 48 hour(s))  CBC with Differential     Status: Abnormal   Collection Time: 03/09/18  5:59 AM  Result Value Ref Range   WBC 14.0 (H) 4.0 - 10.5 K/uL   RBC 5.53 4.22 - 5.81 MIL/uL   Hemoglobin 17.0 13.0 - 17.0 g/dL   HCT 52.3 (H) 39.0 - 52.0 %   MCV 94.6 80.0 - 100.0 fL   MCH 30.7 26.0 - 34.0 pg   MCHC 32.5 30.0 - 36.0 g/dL   RDW 15.1 11.5 - 15.5 %   Platelets 281 150 - 400 K/uL   nRBC 0.0 0.0 - 0.2 %   Neutrophils Relative % 74 %   Neutro Abs 10.4 (H) 1.7 - 7.7  K/uL   Lymphocytes Relative 16 %   Lymphs Abs 2.3 0.7 - 4.0 K/uL   Monocytes Relative 6 %   Monocytes Absolute 0.9 0.1 - 1.0 K/uL   Eosinophils Relative 2 %   Eosinophils Absolute 0.3 0.0 - 0.5 K/uL   Basophils Relative 1 %   Basophils Absolute 0.1 0.0 - 0.1 K/uL   Immature Granulocytes 1 %   Abs Immature Granulocytes 0.09 (H) 0.00 - 0.07 K/uL    Comment: Performed at [facility], [place] [address], [place], [place] [address]  Basic metabolic panel     Status: Abnormal   Collection Time: 03/09/18  5:59 AM  Result Value Ref Range   Sodium 136 135 - 145 mmol/L   Potassium 3.6 3.5 - 5.1 mmol/L   Chloride 103 98 - 111 mmol/L   CO2 22 22 - 32 mmol/L   Glucose, Bld 141 (H) 70 - 99 mg/dL   BUN 11 6 - 20 mg/dL   Creatinine, Ser 0.78 0.61 - 1.24 mg/dL   Calcium 9.0 8.9 - 10.3 mg/dL   GFR calc non Af Amer >60 >60 mL/min   GFR calc Af Amer >60 >60 mL/min   Anion gap 11 5 - 15    Comment: Performed at [facility], [place] [address] [place], [place] [address]  Ethanol     Status: None   Collection Time: 03/09/18  5:59 AM  Result Value Ref Range   Alcohol, Ethyl (B) <10 <10 mg/dL    Comment: (NOTE) Lowest detectable limit for serum alcohol is 10 mg/dL. For medical purposes only. Performed at [facility] [place] [address], [place],  [address]   Acetaminophen level     Status: Abnormal   Collection Time: 03/09/18  5:59 AM  Result Value Ref Range   Acetaminophen (Tylenol), Serum <10 (L) 10 - 30 ug/mL    Comment: (NOTE) Therapeutic concentrations vary significantly. A range of 10-30 ug/mL  may be an effective concentration for many patients. However, some  are best treated at concentrations outside of this range. Acetaminophen concentrations >150 ug/mL at 4 hours after ingestion  and >50 ug/mL at 12 hours after ingestion are often associated with  toxic reactions. Performed at [facility] [place]  [address] [place], [place] [address]   Salicylate level     Status: None   Collection Time: 03/09/18  5:59 AM  Result Value Ref Range   Salicylate Lvl <0.3 2.8 - 30.0 mg/dL    Comment: Performed at [facility], [place] [address], [place], [place] [address]  Urinalysis, Routine w reflex microscopic     Status: Abnormal   Collection Time: 03/09/18  5:59 AM  Result Value Ref Range   Color, Urine YELLOW YELLOW   APPearance CLEAR CLEAR   Specific Gravity, Urine 1.008 1.005 - 1.030   pH 6.0 5.0 - 8.0   Glucose, UA NEGATIVE NEGATIVE mg/dL   Hgb urine dipstick SMALL (A) NEGATIVE   Bilirubin Urine NEGATIVE NEGATIVE   Ketones, ur NEGATIVE NEGATIVE mg/dL   Protein, ur NEGATIVE NEGATIVE mg/dL   Nitrite NEGATIVE NEGATIVE   Leukocytes, UA NEGATIVE NEGATIVE   RBC / HPF 0-5 0 - 5 RBC/hpf   WBC, UA 0-5 0 - 5 WBC/hpf   Bacteria, UA NONE SEEN NONE SEEN   Mucus PRESENT     Comment: Performed at [facility], [place] [address], [place], [place] [address]  Rapid urine drug screen (hospital performed)     Status: Abnormal   Collection Time: 03/09/18  5:59 AM  Result Value Ref Range   Opiates NONE DETECTED NONE DETECTED   Cocaine POSITIVE (A) NONE DETECTED   Benzodiazepines NONE DETECTED NONE DETECTED   Amphetamines NONE DETECTED NONE DETECTED   Tetrahydrocannabinol NONE DETECTED NONE DETECTED   Barbiturates NONE DETECTED NONE DETECTED    Comment: (NOTE) DRUG SCREEN FOR MEDICAL PURPOSES ONLY.  IF CONFIRMATION IS NEEDED FOR ANY PURPOSE, NOTIFY LAB WITHIN 5 DAYS. LOWEST DETECTABLE LIMITS FOR URINE DRUG SCREEN Drug Class                     Cutoff (ng/mL) Amphetamine and metabolites    1000 Barbiturate and metabolites    200 Benzodiazepine                 979 Tricyclics and metabolites     300 Opiates and metabolites        300 Cocaine and metabolites        300 THC                            50 Performed at [facility], [place] [address] [place], [place] [address]     Blood Alcohol level:  Lab Results  Component Value Date   ETH <10 03/09/2018   ETH <10 55/37/4827    Metabolic Disorder Labs:  No results found for: HGBA1C, MPG No results found for: PROLACTIN No results found for: CHOL, TRIG, HDL, CHOLHDL, VLDL, LDLCALC  Current Medications: Current Facility-Administered Medications  Medication Dose Route Frequency Provider Last Rate Last Dose  . acetaminophen (TYLENOL) tablet 650 mg  650 mg Oral Q6H PRN Ethelene Hal, NP      . alum & mag hydroxide-simeth (MAALOX/MYLANTA) 200-200-20 MG/5ML suspension 30 mL  30 mL Oral Q4H PRN Ethelene Hal, NP      . bacitracin ointment   Topical Daily PRN Ethelene Hal, NP      . benztropine (COGENTIN) tablet 0.5 mg  0.5 mg Oral QHS Ethelene Hal, NP   0.5 mg at 03/09/18 2310  . cariprazine (VRAYLAR) capsule 3 mg  3 mg Oral Daily Johnn Hai, MD      . [  START ON 03/11/2018] FLUoxetine (PROZAC) capsule 60 mg  60 mg Oral Daily Johnn Hai, MD      . haloperidol (HALDOL) 5 MG tablet           . magnesium hydroxide (MILK OF MAGNESIA) suspension 30 mL  30 mL Oral Daily PRN Ethelene Hal, NP      . nicotine polacrilex (NICORETTE) gum 2 mg  2 mg Oral PRN Sharma Covert, MD   2 mg at 03/10/18 0915  . ondansetron (ZOFRAN) tablet 4 mg  4 mg Oral Q8H PRN Ethelene Hal, NP      . prazosin (MINIPRESS) capsule 1 mg  1 mg Oral QHS Sharma Covert, MD      . traZODone (DESYREL) tablet 50 mg  50 mg Oral QHS Ethelene Hal, NP   50 mg at 03/09/18 2310   PTA Medications: Medications Prior to Admission  Medication Sig Dispense Refill Last Dose  . benztropine (COGENTIN) 0.5 MG tablet Take 0.5 mg by mouth at bedtime.   Past Month at Unknown time  . FLUoxetine (PROZAC) 40 MG capsule Take 40 mg by mouth daily.   Past Week at Unknown time  . INVEGA TRINZA 546 MG/1.75ML SUSY Inject 546 mg into the skin every 3 (three) months.   Past Month at  Unknown time  . prazosin (MINIPRESS) 1 MG capsule Take 1 mg by mouth daily.   Past Week at Unknown time  . traZODone (DESYREL) 50 MG tablet Take 50 mg by mouth at bedtime.   Past Week at Unknown time    Musculoskeletal: Strength & Muscle Tone: within normal limits Gait & Station: normal Patient leans: N/A  Psychiatric Specialty Exam: Physical Exam  Normal sinus rhythm lungs are clear cranial nerves and neurological system intact see ER record for further HPI notes  ROS denies issues from a neurological standpoint  Blood pressure 118/76, pulse 95, temperature 97.9 F (36.6 C), temperature source Oral, resp. rate 18, height 5\' 8"  (1.727 m), weight 77.6 kg.Body mass index is 26 kg/m.  General Appearance: Casual  Eye Contact:  Good  Speech:  Clear and Coherent  Volume:  Normal  Mood:  Euthymic  Affect:  Appropriate  Thought Process:  Goal Directed  Orientation:  Full (Time, Place, and Person)  Thought Content:  Logical  Suicidal Thoughts:  Yes.  without intent/plan  Homicidal Thoughts:  No  Memory:  Immediate;   Good  Judgement:  Good  Insight:  Good  Psychomotor Activity:  Normal  Concentration:  Concentration: Good  Recall:  Good  Fund of Knowledge:  Good  Language:  Good  Akathisia:  Negative  Handed:  Right  AIMS (if indicated):     Assets:  Communication Skills Desire for Improvement  ADL's:  Intact  Cognition:  WNL  Sleep:  Number of Hours: 6.25    Treatment Plan Summary: Daily contact with patient to assess and evaluate symptoms and progress in treatment and Medication management  Observation Level/Precautions:  1 to 1  Laboratory:  UDS  Psychotherapy: Cognitive-based  Medications: Escalate Prozac augment with vraylar  Consultations: Not applicable  Discharge Concerns: Short-term and long-term safety  Estimated LOS: 5 days  Other: Precautions changed   Physician Treatment Plan for Primary Diagnosis: Schizoaffective depressed/other substance  dependence Long Term Goal(s): Improvement in symptoms so as ready for discharge  Short Term Goals: Ability to identify changes in lifestyle to reduce recurrence of condition will improve, Ability to verbalize feelings will improve, Ability to  demonstrate self-control will improve and Ability to identify and develop effective coping behaviors will improve  Physician Treatment Plan for Secondary Diagnosis: Active Problems:   Schizophrenia ([place])  Long Term Goal(s): Improvement in symptoms so as ready for discharge  Short Term Goals: Ability to identify changes in lifestyle to reduce recurrence of condition will improve and Ability to verbalize feelings will improve  I certify that inpatient services furnished can reasonably be expected to improve the patient's condition.    Johnn Hai, MD 12/5/20199:27 AM

## 2018-03-10 NOTE — Progress Notes (Signed)
1:1 Patient is currently sitting in the day room after group. Behavior is appropriate. Sitter is within arms reach. Patient is in no current distress, respirations are even and unlabored. Will continue to monitor and provide support.

## 2018-03-11 MED ORDER — TRAZODONE HCL 100 MG PO TABS
100.0000 mg | ORAL_TABLET | Freq: Every evening | ORAL | Status: DC | PRN
  Administered 2018-03-11: 100 mg via ORAL
  Filled 2018-03-11: qty 1

## 2018-03-11 NOTE — Progress Notes (Signed)
Adult Psychoeducational Group Note  Date:  03/11/2018 Time:  1:17 PM  Group Topic/Focus:  Goals Group:   The focus of this group is to help patients establish daily goals to achieve during treatment and discuss how the patient can incorporate goal setting into their daily lives to aide in recovery.  Participation Level:  Active  Participation Quality:  Appropriate  Affect:  Appropriate  Cognitive:  Appropriate  Insight: Appropriate and Good  Engagement in Group:  Engaged  Modes of Intervention:  Discussion  Additional Comments:  Pt attended morning group.  [name] 03/11/2018, 1:17 PM

## 2018-03-11 NOTE — Progress Notes (Signed)
D:  [name] is quietly laying in his bed and appears to be asleep with sitter by his side.  Breathing is even and unlabored.   A:  1:1 continues for safety. R:  [name] remains safe on the unit.  We will continue to monitor.

## 2018-03-11 NOTE — Progress Notes (Signed)
[place] [facility] 1:1 Observation Documentation  For the first (8) hours following discontinuation of 1:1 precautions, a progress note entry by nursing staff should be documented at least every 2 hours, reflecting the patient's behavior, condition, mood, and conversation.  Use the progress notes for additional entries.  Time 1:1 discontinued:  1400  Patient's Behavior:  Pt is pleasant and cooperative.  Patient's Condition:  Pt shows no signs of discomfort.   Patient's Conversation:  Pt stated "I'm doing ok, I want to get better."  Harriet Masson 03/11/2018, 7:04 PM

## 2018-03-11 NOTE — Progress Notes (Signed)
1:1 Note D:  [name] was resting quietly with his eyes closed and appeared to be asleep.  Sitter was by his side.  Breathing was even and unlabored.   A:  1:1 remains for safety. R:  [name] remains safe on the unit.

## 2018-03-11 NOTE — Progress Notes (Signed)
[place] [facility] 1:1 Observation Documentation  For the first (8) hours following discontinuation of 1:1 precautions, a progress note entry by nursing staff should be documented at least every 2 hours, reflecting the patient's behavior, condition, mood, and conversation.  Use the progress notes for additional entries.  Time 1:1 discontinued:  1400  Patient's Behavior:  Pt remains cooperative on the unit.  Patient's Condition: No pain or discomfort noted  Patient's Conversation:  Pt asked about his bedtime medications and stated,  "I'm going to make sure I see my doctor for my shot when I leave here."  Harriet Masson 03/11/2018, 5:55 PM

## 2018-03-11 NOTE — Plan of Care (Signed)
  Problem: Coping: Goal: Ability to verbalize frustrations and anger appropriately will improve Outcome: Progressing Goal: Ability to demonstrate self-control will improve Outcome: Progressing    D: Pt alert and oriented on the unit. Pt engaging with RN staff and other pts. Pt was discontinued from 1:1 and contracted for safety. Pt has been pleasant with no signs of distress or discomfort. Pt also participated during unit groups and activities. Pt is cooperative. A: Education, support and encouragement provided, q15 minute safety checks remain in effect. Medications administered per MD orders. R: No reactions/side effects to medicine noted. Pt denies any concerns at this time, and verbally contracts for safety. Pt ambulating on the unit with no issues. Pt remains safe on and off the unit.

## 2018-03-11 NOTE — Progress Notes (Signed)
[facility] MD Progress Note  03/11/2018 10:24 AM [name]  MRN:  [id number] Subjective:    Patient is seen he continues to endorse recent but no current suicidal thoughts can contract here he continues to have an unusual disconnect between reporting depressive symptoms yet seeming euthymic and smiling during the interview he does not want to harm others he states that he is agreeable to some type of rehab his brother his working on these arrangements as the patient is spent over $40,000 on cocaine in the past month he is now alert and oriented and cooperative again denying current suicidal thoughts contracting here- Principal Problem: Thought to be schizoaffective depressed complicated by cocaine dependence and drug abuse however there is issues of personality as well that may indicate a personality disorder Diagnosis: Active Problems:   Schizophrenia ([place])   Schizoaffective disorder, depressive type ([place])  Total Time spent with patient: 20 minutes  Past Medical History:  Past Medical History:  Diagnosis Date  . Anxiety   . Headache     Past Surgical History:  Procedure Laterality Date  . APPENDECTOMY    . TIBIA FRACTURE SURGERY     right   Family History: History reviewed. No pertinent family history.  Social History:  Social History   Substance and Sexual Activity  Alcohol Use Yes   Comment: weekly     Social History   Substance and Sexual Activity  Drug Use Yes  . Types: Marijuana, "Crack" cocaine   Comment: hx 2015 cocaine    Social History   Socioeconomic History  . Marital status: Single    Spouse name: Not on file  . Number of children: Not on file  . Years of education: Not on file  . Highest education level: Not on file  Occupational History  . Not on file  Social Needs  . Financial resource strain: Not on file  . Food insecurity:    Worry: Not on file    Inability: Not on file  . Transportation needs:    Medical: Not on file    Non-medical: Not on  file  Tobacco Use  . Smoking status: Current Some Day Smoker    Types: Cigars  . Smokeless tobacco: Never Used  Substance and Sexual Activity  . Alcohol use: Yes    Comment: weekly  . Drug use: Yes    Types: Marijuana, "Crack" cocaine    Comment: hx 2015 cocaine  . Sexual activity: Yes  Lifestyle  . Physical activity:    Days per week: Not on file    Minutes per session: Not on file  . Stress: Not on file  Relationships  . Social connections:    Talks on phone: Not on file    Gets together: Not on file    Attends religious service: Not on file    Active member of club or organization: Not on file    Attends meetings of clubs or organizations: Not on file    Relationship status: Not on file  Other Topics Concern  . Not on file  Social History Narrative  . Not on file   Additional Social History:                         Sleep: Fair  Appetite:  Fair  Current Medications: Current Facility-Administered Medications  Medication Dose Route Frequency Provider Last Rate Last Dose  . acetaminophen (TYLENOL) tablet 650 mg  650 mg Oral Q6H PRN Jinny Blossom  Toribio Harbour, NP   650 mg at 03/11/18 1018  . alum & mag hydroxide-simeth (MAALOX/MYLANTA) 200-200-20 MG/5ML suspension 30 mL  30 mL Oral Q4H PRN Ethelene Hal, NP      . bacitracin ointment   Topical Daily PRN Ethelene Hal, NP      . benztropine (COGENTIN) tablet 0.5 mg  0.5 mg Oral QHS Ethelene Hal, NP   0.5 mg at 03/10/18 2107  . cariprazine (VRAYLAR) capsule 3 mg  3 mg Oral Daily Johnn Hai, MD   3 mg at 03/11/18 0844  . FLUoxetine (PROZAC) capsule 60 mg  60 mg Oral Daily Johnn Hai, MD   60 mg at 03/11/18 0844  . magnesium hydroxide (MILK OF MAGNESIA) suspension 30 mL  30 mL Oral Daily PRN Ethelene Hal, NP      . nicotine polacrilex (NICORETTE) gum 2 mg  2 mg Oral PRN Sharma Covert, MD   2 mg at 03/10/18 0915  . ondansetron (ZOFRAN) tablet 4 mg  4 mg Oral Q8H PRN Ethelene Hal, NP      . prazosin (MINIPRESS) capsule 1 mg  1 mg Oral QHS Sharma Covert, MD   1 mg at 03/10/18 2107  . traZODone (DESYREL) tablet 50 mg  50 mg Oral QHS Ethelene Hal, NP   50 mg at 03/10/18 2107    Lab Results: No results found for this or any previous visit (from the past 48 hour(s)).  Blood Alcohol level:  Lab Results  Component Value Date   ETH <10 03/09/2018   ETH <10 76/28/3151    Metabolic Disorder Labs: No results found for: HGBA1C, MPG No results found for: PROLACTIN No results found for: CHOL, TRIG, HDL, CHOLHDL, VLDL, LDLCALC  Physical Findings: AIMS: Facial and Oral Movements Muscles of Facial Expression: None, normal Lips and Perioral Area: None, normal Jaw: None, normal Tongue: None, normal,Extremity Movements Upper (arms, wrists, hands, fingers): None, normal Lower (legs, knees, ankles, toes): None, normal, Trunk Movements Neck, shoulders, hips: None, normal, Overall Severity Severity of abnormal movements (highest score from questions above): None, normal Incapacitation due to abnormal movements: None, normal Patient's awareness of abnormal movements (rate only patient's report): No Awareness, Dental Status Current problems with teeth and/or dentures?: No Does patient usually wear dentures?: No  CIWA:    COWS:     Musculoskeletal: Strength & Muscle Tone: within normal limits Gait & Station: normal Patient leans: N/A  Psychiatric Specialty Exam: Physical Exam  ROS  Blood pressure 117/76, pulse (!) 101, temperature 97.8 F (36.6 C), temperature source Oral, resp. rate 18, height 5\' 8"  (1.727 m), weight 77.6 kg.Body mass index is 26 kg/m.  General Appearance: Casual  Eye Contact:  Fair  Speech:  Normal Rate  Volume:  Normal  Mood:  Euthymic  Affect:  Appropriate  Thought Process:  Goal Directed  Orientation:  Full (Time, Place, and Person)  Thought Content:  Logical  Suicidal Thoughts:  Yes.  without intent/plan   Homicidal Thoughts:  No  Memory:  Recent;   Fair  Judgement:  Fair  Insight:  Fair  Psychomotor Activity:  Normal  Concentration:  Concentration: Fair  Recall:  [employer] of Knowledge:  Fair  Language:  Fair  Akathisia:  Negative  Handed:  Right  AIMS (if indicated):     Assets:  Desire for Improvement  ADL's:  Intact  Cognition:  WNL  Sleep:  Number of Hours: 6.25    We will continue  one-to-one precautions and leave it up to nursing discretion based on his ability to contract Treatment Plan Summary: Daily contact with patient to assess and evaluate symptoms and progress in treatment and Medication management  Johnn Hai, MD 03/11/2018, 10:24 AM

## 2018-03-11 NOTE — Care Management (Signed)
CMA made referral to [facility]. CMA will follow up. CMA will notify LCSW.    Abbygael Curtiss Care Management Assistant  Email:[email or web address] Office: [phone]

## 2018-03-11 NOTE — Progress Notes (Signed)
Post 1:1 note (discontinued at 1400)    D.  Pt pleasant and cooperative, complaint of lheartburn.  Pt requested medication for this and stated that he will take his night medication at 2100.  A.  Support and encouragement offered, medication given as ordered  R. Pt remains safe on the unit, will continue to monitor.

## 2018-03-11 NOTE — Progress Notes (Signed)
1:1 NOTE  D: Pt is ambulatory in hall and dayroom. Pt is pleasant and cooperative. Pt currently in morning group session. A: 1:1 remains for pt safety. R: Pt remains safe on the unit.

## 2018-03-11 NOTE — Plan of Care (Signed)
  Problem: Medication: Goal: Compliance with prescribed medication regimen will improve Outcome: Progressing Note:  Clarence continues to take his medications as prescribed.

## 2018-03-11 NOTE — Progress Notes (Signed)
Pt did not attend AA group this evening.  

## 2018-03-11 NOTE — Progress Notes (Signed)
CSW met with pt individually to discuss aftercare plan. Pt agreed to [facility] referral and acknowledged that he would benefit from more time in a hospital setting due to onging AH, SI thoughts due to [facility], and cravings associated with crack cocaine abuse history. Pt pleasant and cooperative during interaction. He remains on 1:1 for safety due to ongoing SI. CSW left message with pt's brother/guaridan to provide update on [facility] referral.   La Dibella S. Ouida Sills, MSW, LCSW Clinical Social Worker 03/11/2018 11:20 AM

## 2018-03-11 NOTE — Progress Notes (Signed)
[facility] Post 1:1 Observation Documentation  For the first (8) hours following discontinuation of 1:1 precautions, a progress note entry by nursing staff should be documented at least every 2 hours, reflecting the patient's behavior, condition, mood, and conversation.  Use the progress notes for additional entries.  Time 1:1 discontinued:  1400  Patient's Behavior:  Pt is calm, cooperative, and pleasant while sitting sitting in dayroom watching television.  Patient's Condition:  Pt is engaged with nursing staff. No discomfort noted.   Patient's Conversation:  Pt stated that can contract for safety and will make sure to talk with nursing staff if he has thoughts to harm himself. Pt attended groups and went off the unit to the cafeteria for meal times. Pt is safe on and off the unit.  Harriet Masson 03/11/2018, 5:44 PM

## 2018-03-11 NOTE — Progress Notes (Signed)
Post 1:1 note (discontinued at 1400)  D.  Pt has been bright and pleasant.  Pt has remained up on unit, interacting appropriately with peers on the unit.  No distress noted.  Pt denies SI/HI/AVH at this time.  No additional complaints voiced.  A.  Support and encouragement offered  R.  Pt remains safe on the unit.  Will continue to monitor.

## 2018-03-12 MED ORDER — AMANTADINE HCL 100 MG PO CAPS
100.0000 mg | ORAL_CAPSULE | Freq: Two times a day (BID) | ORAL | Status: DC
  Administered 2018-03-12 – 2018-03-14 (×5): 100 mg via ORAL
  Filled 2018-03-12 (×9): qty 1

## 2018-03-12 MED ORDER — OLANZAPINE 5 MG PO TABS
5.0000 mg | ORAL_TABLET | Freq: Two times a day (BID) | ORAL | Status: DC | PRN
  Administered 2018-03-12 – 2018-03-16 (×2): 5 mg via ORAL
  Filled 2018-03-12 (×2): qty 2

## 2018-03-12 MED ORDER — TRAZODONE HCL 100 MG PO TABS
200.0000 mg | ORAL_TABLET | Freq: Every day | ORAL | Status: DC
  Administered 2018-03-12 – 2018-03-17 (×6): 200 mg via ORAL
  Filled 2018-03-12 (×8): qty 2

## 2018-03-12 NOTE — Progress Notes (Addendum)
[facility] MD Progress Note  03/12/2018 4:53 PM [name]  MRN:  [id number]   Evaluation: [name] observed sitting in day room rocking back and forth.  As patient reports auditory hallucinations.  Denies that "hallucinations are command in nature.  Patient reports history of suicidal ideations as the thoughts are chronic.  Reports taking multiple medications to help with auditory hallucinations however states medication has not worked in the past.  Reports taking medications as prescribed and tolerating them well.  Discussed initiating Zyprexa to aid with symptoms patient was agreeable to plan.  [name] reports cocaine cravings, as he states cocaine usually mask his auditory hallucinations. " voices are not as loud when I use" Initiated Symmetrel 100 mg p.o. twice daily.  And increase trazodone 100 mg to 200 mg p.o. nightly for insomnia.  Rates his depression 10 out of 10 with 10 being the worst.  Support encouragement reassurance was provided  History:known to the service he is a 37 year old patient who reports to me exactly 4 attempts at harming himself in the past by overdose who states he called the crisis line and 911 because he felt suicidal.  He acknowledges cocaine dependency, acknowledges cannabis dependency, and some alcohol usage. At times he is reported auditory hallucinations he describes them to me is always inside not outside his head. There is an unusual incongruence despite him reporting he is depressed and suicidal he does smile and seemed very appropriate and euthymic in the interview. .     Principal Problem: Thought to be schizoaffective depressed complicated by cocaine dependence and drug abuse however there is issues of personality as well that may indicate a personality disorder   Diagnosis: Active Problems:   Schizophrenia ([place])   Schizoaffective disorder, depressive type ([place])  Total Time spent with patient: 20 minutes  Past Medical History:  Past Medical History:   Diagnosis Date  . Anxiety   . Headache     Past Surgical History:  Procedure Laterality Date  . APPENDECTOMY    . TIBIA FRACTURE SURGERY     right   Family History: History reviewed. No pertinent family history.  Social History:  Social History   Substance and Sexual Activity  Alcohol Use Yes   Comment: weekly     Social History   Substance and Sexual Activity  Drug Use Yes  . Types: Marijuana, "Crack" cocaine   Comment: hx 2015 cocaine    Social History   Socioeconomic History  . Marital status: Single    Spouse name: Not on file  . Number of children: Not on file  . Years of education: Not on file  . Highest education level: Not on file  Occupational History  . Not on file  Social Needs  . Financial resource strain: Not on file  . Food insecurity:    Worry: Not on file    Inability: Not on file  . Transportation needs:    Medical: Not on file    Non-medical: Not on file  Tobacco Use  . Smoking status: Current Some Day Smoker    Types: Cigars  . Smokeless tobacco: Never Used  Substance and Sexual Activity  . Alcohol use: Yes    Comment: weekly  . Drug use: Yes    Types: Marijuana, "Crack" cocaine    Comment: hx 2015 cocaine  . Sexual activity: Yes  Lifestyle  . Physical activity:    Days per week: Not on file    Minutes per session: Not on file  .  Stress: Not on file  Relationships  . Social connections:    Talks on phone: Not on file    Gets together: Not on file    Attends religious service: Not on file    Active member of club or organization: Not on file    Attends meetings of clubs or organizations: Not on file    Relationship status: Not on file  Other Topics Concern  . Not on file  Social History Narrative  . Not on file   Additional Social History:                         Sleep: Fair  Appetite:  Fair  Current Medications: Current Facility-Administered Medications  Medication Dose Route Frequency Provider Last Rate  Last Dose  . acetaminophen (TYLENOL) tablet 650 mg  650 mg Oral Q6H PRN Ethelene Hal, NP   650 mg at 03/11/18 1018  . alum & mag hydroxide-simeth (MAALOX/MYLANTA) 200-200-20 MG/5ML suspension 30 mL  30 mL Oral Q4H PRN Ethelene Hal, NP      . amantadine (SYMMETREL) capsule 100 mg  100 mg Oral BID Derrill Center, NP   100 mg at 03/12/18 1010  . bacitracin ointment   Topical Daily PRN Ethelene Hal, NP      . benztropine (COGENTIN) tablet 0.5 mg  0.5 mg Oral QHS Ethelene Hal, NP   0.5 mg at 03/11/18 2116  . cariprazine (VRAYLAR) capsule 3 mg  3 mg Oral Daily Johnn Hai, MD   3 mg at 03/12/18 0756  . FLUoxetine (PROZAC) capsule 60 mg  60 mg Oral Daily Johnn Hai, MD   60 mg at 03/12/18 0756  . magnesium hydroxide (MILK OF MAGNESIA) suspension 30 mL  30 mL Oral Daily PRN Ethelene Hal, NP      . nicotine polacrilex (NICORETTE) gum 2 mg  2 mg Oral PRN Sharma Covert, MD   2 mg at 03/11/18 1106  . OLANZapine (ZYPREXA) tablet 5 mg  5 mg Oral BID PRN Derrill Center, NP   5 mg at 03/12/18 1010  . ondansetron (ZOFRAN) tablet 4 mg  4 mg Oral Q8H PRN Ethelene Hal, NP      . prazosin (MINIPRESS) capsule 1 mg  1 mg Oral QHS Sharma Covert, MD   1 mg at 03/11/18 2116  . traZODone (DESYREL) tablet 200 mg  200 mg Oral QHS Derrill Center, NP        Lab Results: No results found for this or any previous visit (from the past 16 hour(s)).  Blood Alcohol level:  Lab Results  Component Value Date   ETH <10 03/09/2018   ETH <10 50/53/9767    Metabolic Disorder Labs: No results found for: HGBA1C, MPG No results found for: PROLACTIN No results found for: CHOL, TRIG, HDL, CHOLHDL, VLDL, LDLCALC  Physical Findings: AIMS: Facial and Oral Movements Muscles of Facial Expression: None, normal Lips and Perioral Area: None, normal Jaw: None, normal Tongue: None, normal,Extremity Movements Upper (arms, wrists, hands, fingers): None, normal Lower  (legs, knees, ankles, toes): None, normal, Trunk Movements Neck, shoulders, hips: None, normal, Overall Severity Severity of abnormal movements (highest score from questions above): None, normal Incapacitation due to abnormal movements: None, normal Patient's awareness of abnormal movements (rate only patient's report): No Awareness, Dental Status Current problems with teeth and/or dentures?: No Does patient usually wear dentures?: No  CIWA:    COWS:  Musculoskeletal: Strength & Muscle Tone: within normal limits Gait & Station: normal Patient leans: N/A  Psychiatric Specialty Exam: Physical Exam  ROS  Blood pressure 120/77, pulse 81, temperature 97.8 F (36.6 C), resp. rate 18, height 5\' 8"  (1.727 m), weight 77.6 kg.Body mass index is 26 kg/m.  General Appearance: Casual  Eye Contact:  Fair  Speech:  Normal Rate  Volume:  Normal  Mood:  Euthymic  Affect:  Appropriate  Thought Process:  Coherent and Goal Directed  Orientation:  Full (Time, Place, and Person)  Thought Content:  Logical  Suicidal Thoughts:  Yes.  without intent/plan  Homicidal Thoughts:  No  Memory:  Recent;   Fair  Judgement:  Fair  Insight:  Fair  Psychomotor Activity:  Normal  Concentration:  Concentration: Fair  Recall:  [employer] of Knowledge:  Fair  Language:  Fair  Akathisia:  Negative  Handed:  Right  AIMS (if indicated):     Assets:  Desire for Improvement  ADL's:  Intact  Cognition:  WNL  Sleep:  Number of Hours: 6.75     Treatment Plan Summary: Daily contact with patient to assess and evaluate symptoms and progress in treatment and Medication management   Continue with current treatment plan on 03/12/2018 as listed below except for noted   Mood stabilization  Continue Prozac 60 mg p.o. Daily  Continue Vraylar 3 mg p.o. daily  Initiated Zyprexa 5 mg p.o. as needed twice daily  Initiated Symmetrel 100 mg p.o. twice daily   Increased trazodone 100 mg to 200 mg  for  insomnia   Will continue to monitor vitals ,medication compliance and treatment side effects while patient is here.   CSW continue working on disposition.  Patient to participate in therapeutic milieu  Derrill Center, NP 03/12/2018, 4:53 PM    ..Agree with NP Progress Note

## 2018-03-12 NOTE — BHH Group Notes (Signed)
LCSW Group Therapy Note  03/12/2018   10:00-11:00am   Type of Therapy and Topic:  Group Therapy: Anger Cues and Responses  Participation Level:  Active   Description of Group:   In this group, patients learned how to recognize the physical, cognitive, emotional, and behavioral responses they have to anger-provoking situations.  They identified a recent time they became angry and how they reacted.  They analyzed how their reaction was possibly beneficial and how it was possibly unhelpful.  The group discussed a variety of healthier coping skills that could help with such a situation in the future.  Deep breathing was practiced briefly.  Therapeutic Goals: 1. Patients will remember their last incident of anger and how they felt emotionally and physically, what their thoughts were at the time, and how they behaved. 2. Patients will identify how their behavior at that time worked for them, as well as how it worked against them. 3. Patients will explore possible new behaviors to use in future anger situations. 4. Patients will learn that anger itself is normal and cannot be eliminated, and that healthier reactions can assist with resolving conflict rather than worsening situations.  Summary of Patient Progress:  The patient shared that he was angry with himself for using drugs and that this makes him angry and sad. When he experiences anger he he aware of the physical and emotional responses that are associated with anger. To cope with anger he indicated that talking to his mother is the coping strategy he uses most often. The patient recognizes that anger is a natural part of life and that he can chose how he responds to it.  Therapeutic Modalities:   Cognitive Behavioral Therapy  Rolanda Jay

## 2018-03-12 NOTE — BHH Group Notes (Signed)
Pt did not attend RN Psychoeducational group today.

## 2018-03-12 NOTE — Progress Notes (Signed)
Pt did not attend AA group this evening.  

## 2018-03-12 NOTE — Plan of Care (Addendum)
  Problem: Coping: Goal: Ability to verbalize frustrations and anger appropriately will improve Outcome: Progressing Goal: Ability to demonstrate self-control will improve Outcome: Progressing   D: Pt alert and oriented on the unit. Pt denies SI/HI but endorses A/VH "just a little." "The medicine has been helping." Pt's expression was animated and his mood was pleasant. Pt denied any pain at this time.Pt is cooperative on the unit.  A: Education, support and encouragement provided, q15 minute safety checks remain in effect. Medications administered per MD orders. R: No reactions/side effects to medicine noted. Pt denies any concerns at this time, and verbally contracts for safety. Pt ambulating on the unit with no issues. Pt remains safe on and off the unit.

## 2018-03-13 NOTE — [facility] Group Notes (Signed)
[place] LCSW Group Therapy Note  03/13/2018  10:00-11:00AM  Type of Therapy and Topic:  Group Therapy:  Adding Supports Including Being Your Own Support  Participation Level:  Active   Description of Group:  Patients in this group were introduced to the concept that additional supports including self-support are an essential part of recovery.  A song entitled "I Need Help!" was played and a group discussion was held in reaction to the idea of needing to add supports.  A song entitled "My Own Hero" was played and a group discussion ensued in which patients stated they could relate to the song and it inspired them to realize they have be willing to help themselves in order to succeed, because other people cannot achieve sobriety or stability for them.  We discussed adding a variety of healthy supports to address the various needs in their lives.  A song was played called "I Know Where I've Been" toward the end of group and used to conduct an inspirational wrap-up to group of remembering how far they have already come in their journey.  Therapeutic Goals: 1)  demonstrate the importance of being a part of one's own support system 2)  discuss reasons people in one's life may eventually be unable to be continually supportive  3)  identify the patient's current support system and   4)  elicit commitments to add healthy supports and to become more conscious of being self-supportive   Summary of Patient Progress:  The patient expressed that he is not healthy for himself when he is actively engaged in his addiction, while when he is clean and sober he is healthy for himself.  He stated he has come to the strong conclusion that he cannot remain clean without help so is very focused on going to rehab as the next step in his journey.  He showed developing insight.   Therapeutic Modalities:   Motivational Interviewing Activity  Maretta Los

## 2018-03-13 NOTE — Progress Notes (Addendum)
[facility] MD Progress Note  03/13/2018 9:04 AM [name]  MRN:  [id number]   Evaluation: [name] observed standing at the nursing station. Reports overall he is feeling better. Report his hallucinations are improving. Patient present with a bright affect. He is pleasant, clam and cooperative.  Reports he is eager to attended a residential facility.  Sandra reports taken and tolerating medications well. Reports he has a follow appointment with [facility] for his 3 month Inveag injection 04/2018. Patient reports history of suicidal ideations as the thoughts are chronic. Patient denies intent or plan.  Reports a good appetite and states he is resting well with medication increase to the trazodone. Support, encouragement and reassurance was provided.   History:known to the service he is a 37 year old patient who reports to me exactly 4 attempts at harming himself in the past by overdose who states he called the crisis line and 911 because he felt suicidal.  He acknowledges cocaine dependency, acknowledges cannabis dependency, and some alcohol usage. At times he is reported auditory hallucinations he describes them to me is always inside not outside his head. There is an unusual incongruence despite him reporting he is depressed and suicidal he does smile and seemed very appropriate and euthymic in the interview. .     Principal Problem: Thought to be schizoaffective depressed complicated by cocaine dependence and drug abuse however there is issues of personality as well that may indicate a personality disorder   Diagnosis: Active Problems:   Schizophrenia ([place])   Schizoaffective disorder, depressive type ([place])  Total Time spent with patient: 20 minutes  Past Medical History:  Past Medical History:  Diagnosis Date  . Anxiety   . Headache     Past Surgical History:  Procedure Laterality Date  . APPENDECTOMY    . TIBIA FRACTURE SURGERY     right   Family History: History reviewed. No pertinent  family history.  Social History:  Social History   Substance and Sexual Activity  Alcohol Use Yes   Comment: weekly     Social History   Substance and Sexual Activity  Drug Use Yes  . Types: Marijuana, "Crack" cocaine   Comment: hx 2015 cocaine    Social History   Socioeconomic History  . Marital status: Single    Spouse name: Not on file  . Number of children: Not on file  . Years of education: Not on file  . Highest education level: Not on file  Occupational History  . Not on file  Social Needs  . Financial resource strain: Not on file  . Food insecurity:    Worry: Not on file    Inability: Not on file  . Transportation needs:    Medical: Not on file    Non-medical: Not on file  Tobacco Use  . Smoking status: Current Some Day Smoker    Types: Cigars  . Smokeless tobacco: Never Used  Substance and Sexual Activity  . Alcohol use: Yes    Comment: weekly  . Drug use: Yes    Types: Marijuana, "Crack" cocaine    Comment: hx 2015 cocaine  . Sexual activity: Yes  Lifestyle  . Physical activity:    Days per week: Not on file    Minutes per session: Not on file  . Stress: Not on file  Relationships  . Social connections:    Talks on phone: Not on file    Gets together: Not on file    Attends religious service: Not on file  Active member of club or organization: Not on file    Attends meetings of clubs or organizations: Not on file    Relationship status: Not on file  Other Topics Concern  . Not on file  Social History Narrative  . Not on file   Additional Social History:                         Sleep: Fair  Appetite:  Fair  Current Medications: Current Facility-Administered Medications  Medication Dose Route Frequency Provider Last Rate Last Dose  . acetaminophen (TYLENOL) tablet 650 mg  650 mg Oral Q6H PRN Ethelene Hal, NP   650 mg at 03/11/18 1018  . alum & mag hydroxide-simeth (MAALOX/MYLANTA) 200-200-20 MG/5ML suspension 30 mL   30 mL Oral Q4H PRN Ethelene Hal, NP      . amantadine (SYMMETREL) capsule 100 mg  100 mg Oral BID Derrill Center, NP   100 mg at 03/13/18 0802  . bacitracin ointment   Topical Daily PRN Ethelene Hal, NP      . benztropine (COGENTIN) tablet 0.5 mg  0.5 mg Oral QHS Ethelene Hal, NP   0.5 mg at 03/12/18 2108  . cariprazine (VRAYLAR) capsule 3 mg  3 mg Oral Daily Johnn Hai, MD   3 mg at 03/13/18 0801  . FLUoxetine (PROZAC) capsule 60 mg  60 mg Oral Daily Johnn Hai, MD   60 mg at 03/13/18 0801  . magnesium hydroxide (MILK OF MAGNESIA) suspension 30 mL  30 mL Oral Daily PRN Ethelene Hal, NP      . nicotine polacrilex (NICORETTE) gum 2 mg  2 mg Oral PRN Sharma Covert, MD   2 mg at 03/13/18 0804  . OLANZapine (ZYPREXA) tablet 5 mg  5 mg Oral BID PRN Derrill Center, NP   5 mg at 03/12/18 1010  . ondansetron (ZOFRAN) tablet 4 mg  4 mg Oral Q8H PRN Ethelene Hal, NP      . prazosin (MINIPRESS) capsule 1 mg  1 mg Oral QHS Sharma Covert, MD   1 mg at 03/12/18 2108  . traZODone (DESYREL) tablet 200 mg  200 mg Oral QHS Derrill Center, NP   200 mg at 03/12/18 2108    Lab Results: No results found for this or any previous visit (from the past 48 hour(s)).  Blood Alcohol level:  Lab Results  Component Value Date   ETH <10 03/09/2018   ETH <10 89/16/9450    Metabolic Disorder Labs: No results found for: HGBA1C, MPG No results found for: PROLACTIN No results found for: CHOL, TRIG, HDL, CHOLHDL, VLDL, LDLCALC  Physical Findings: AIMS: Facial and Oral Movements Muscles of Facial Expression: None, normal Lips and Perioral Area: None, normal Jaw: None, normal Tongue: None, normal,Extremity Movements Upper (arms, wrists, hands, fingers): None, normal Lower (legs, knees, ankles, toes): None, normal, Trunk Movements Neck, shoulders, hips: None, normal, Overall Severity Severity of abnormal movements (highest score from questions above): None,  normal Incapacitation due to abnormal movements: None, normal Patient's awareness of abnormal movements (rate only patient's report): No Awareness, Dental Status Current problems with teeth and/or dentures?: No Does patient usually wear dentures?: No  CIWA:    COWS:     Musculoskeletal: Strength & Muscle Tone: within normal limits Gait & Station: normal Patient leans: N/A  Psychiatric Specialty Exam: Physical Exam  Vitals reviewed. Constitutional: He appears well-developed.  Psychiatric: He has a  normal mood and affect. His behavior is normal.    Review of Systems  Psychiatric/Behavioral: Positive for depression, hallucinations and suicidal ideas. The patient is nervous/anxious and has insomnia.   All other systems reviewed and are negative.   Blood pressure 119/78, pulse 86, temperature 98 F (36.7 C), temperature source Oral, resp. rate 16, height 5\' 8"  (1.727 m), weight 77.6 kg.Body mass index is 26 kg/m.  General Appearance: Casual  Eye Contact:  Fair  Speech:  Normal Rate  Volume:  Normal  Mood:  Euthymic  Affect:  Appropriate  Thought Process:  Coherent and Goal Directed  Orientation:  Full (Time, Place, and Person)  Thought Content:  Logical  Suicidal Thoughts:  Yes.  without intent/plan  Homicidal Thoughts:  No  Memory:  Recent;   Fair  Judgement:  Fair  Insight:  Fair  Psychomotor Activity:  Normal  Concentration:  Concentration: Fair  Recall:  [employer] of Knowledge:  Fair  Language:  Fair  Akathisia:  Negative  Handed:  Right  AIMS (if indicated):     Assets:  Desire for Improvement  ADL's:  Intact  Cognition:  WNL  Sleep:  Number of Hours: 6.5     Treatment Plan Summary: Daily contact with patient to assess and evaluate symptoms and progress in treatment and Medication management   Continue with current treatment plan on 03/13/2018 as listed below except for noted   Mood stabilization  Continue Prozac 60 mg p.o. Daily  Continue Vraylar 3 mg  p.o. daily  Continue  Zyprexa 5 mg p.o. as needed twice daily  Continue  Symmetrel 100 mg p.o. twice daily  Insomina  Continue  trazodone 200 mg po QHS    Will continue to monitor vitals ,medication compliance and treatment side effects while patient is here.   CSW continue working on disposition.  Patient to participate in therapeutic milieu  Derrill Center, NP 03/13/2018, 9:04 AM ..Agree with NP Progress Note

## 2018-03-13 NOTE — Progress Notes (Signed)
D: Pt was in bed in his room upon initial approach.  Pt presents with appropriate affect and mood.  He describes his day as "okay" and reports goal is to "get better as soon as possible."  He reports feeling "a little" better today than he did yesterday.  Pt denies SI/HI, denies hallucinations, denies pain.  Pt has been in his room for the majority of the evening.  He did not attend evening group.    A: Introduced self to pt.  Met with pt 1:1.  Actively listened to pt and offered support and encouragement.  Medications administered per order.  PO fluids encouraged and provided.  Q15 minute safety checks maintained.  R: Pt is safe on the unit.  Pt is compliant with medications.  Pt verbally contracts for safety.  Will continue to monitor and assess.   

## 2018-03-13 NOTE — Plan of Care (Signed)
  D: Patient is pleasant with staff.  He is interacting well with staff and peers.  Patient asked if staff could get his coat out of his locker and wash it.  Patient plans on going to a long-term rehabilitation center.  He rates his depression and hopelessness as a 7; anxiety as a 6.  Patient states, "I'm still hearing voices to hurt myself.  I'll let you know if it gets too bad."  He is able to contract for safety.    A: Continue to monitor medication management and MD orders.  Safety checks completed every 15 minutes per protocol.  Offer support and encouragement as needed.  R: Patient is receptive to staff; his behavior is appropriate.     Problem: Education: Goal: Emotional status will improve Outcome: Progressing Goal: Mental status will improve Outcome: Progressing Goal: Verbalization of understanding the information provided will improve Outcome: Progressing   Problem: Coping: Goal: Ability to verbalize frustrations and anger appropriately will improve Outcome: Progressing Goal: Ability to demonstrate self-control will improve Outcome: Progressing   Problem: Health Behavior/Discharge Planning: Goal: Identification of resources available to assist in meeting health care needs will improve Outcome: Progressing

## 2018-03-14 MED ORDER — BACLOFEN 20 MG PO TABS
20.0000 mg | ORAL_TABLET | Freq: Three times a day (TID) | ORAL | Status: DC
  Administered 2018-03-14 – 2018-03-16 (×6): 20 mg via ORAL
  Filled 2018-03-14 (×9): qty 1

## 2018-03-14 NOTE — Progress Notes (Signed)
D:  [name] was up and visible on the unit this evening.  He denied SI/HI but continued to hear voices telling him to hurt himself but "they are getting better."  He continues to report having throbbing headache and tylenol was given with partial relief.  He was pleasant and cooperative this evening.  He took his hs medications without difficulty.  He appears to be in no physical distress.  He is still awake but sitting quietly in his room. A:  1:1 with RN for support and encouragement.  Medications as ordered.  Q 15 minute checks maintained for safety.  Encouraged participation in group and unit activities.   R:  [name] remains safe on the unit.  We will continue to monitor the progress towards his goals.

## 2018-03-14 NOTE — Progress Notes (Signed)
Patient ID: [name], male   DOB: 1981/03/08, 37 y.o.   MRN: [id number] DAR Note: Pt observed isolated in his room. Pt at assessment endorsed moderate depression, anxiety and command auditory hallucinations-Pt contracts for safety, "I will never do anything here to harm myself." Pt was med compliant. All patient's questions and concerns addressed. Support, encouragement, and safe environment provided. 15 mins safety check continue for Pt safety. Pt attended [place] meeting.

## 2018-03-14 NOTE — [facility] Group Notes (Signed)
LCSW Group Therapy Note   03/14/2018 1:15pm   Type of Therapy and Topic:  Group Therapy:  Overcoming Obstacles   Participation Level:  Minimal   Description of Group:    In this group patients will be encouraged to explore what they see as obstacles to their own wellness and recovery. They will be guided to discuss their thoughts, feelings, and behaviors related to these obstacles. The group will process together ways to cope with barriers, with attention given to specific choices patients can make. Each patient will be challenged to identify changes they are motivated to make in order to overcome their obstacles. This group will be process-oriented, with patients participating in exploration of their own experiences as well as giving and receiving support and challenge from other group members.   Therapeutic Goals: 1. Patient will identify personal and current obstacles as they relate to admission. 2. Patient will identify barriers that currently interfere with their wellness or overcoming obstacles.  3. Patient will identify feelings, thought process and behaviors related to these barriers. 4. Patient will identify two changes they are willing to make to overcome these obstacles:      Summary of Patient Progress   [name] attended the last few minutes of group. He was noticeably anxious and was demanding to discharge. He could not give a reason why he wanted to discharge, but threatened to not attend [facility] if he could not discharge immediately. CSW ended group and spoke with pt in the hall in attempt to de-escalate him.    Therapeutic Modalities:   Cognitive Behavioral Therapy Solution Focused Therapy Motivational Interviewing Relapse Prevention Therapy  Avelina Laine, LCSW 03/14/2018 11:11 AM

## 2018-03-14 NOTE — Progress Notes (Signed)
Recreation Therapy Notes  Date: 12.9.19 Time: 0930 Location: [address] Dayroom  Group Topic: Stress Management  Goal Area(s) Addresses:  Patient will verbalize importance of using healthy stress management.  Patient will identify positive emotions associated with healthy stress management.   Behavioral Response: Engaged  Intervention: Stress Management  Activity :  Guided Imagery.  LRT introduced the stress management technique of guided imagery.  LRT read a script for patients to visualize being at the beach.  Patients were to follow along as script was read.  Education:  Stress Management, Discharge Planning.   Education Outcome: Acknowledges edcuation/In group clarification offered/Needs additional education  Clinical Observations/Feedback: Pt attended and engaged in activity.    Anitria Andon, LRT/CTRS         Aloma Boch A 03/14/2018 12:22 PM 

## 2018-03-14 NOTE — Progress Notes (Signed)
[facility] MD Progress Note  03/14/2018 9:12 AM [name]  MRN:  [id number] Subjective:    Patient reports continued cravings on helped by the use of amantadine and requesting other medication for cocaine cravings also hopes to go to rehab later in the week possibly as early as tomorrow though [facility] referral has been made States he does not have suicidal thoughts now and can contract for safety here he is conversant he is alert and oriented without acute psychosis.  He does not need one-to-one precautions although still some attention seeking  Principal Problem: <principal problem not specified> Diagnosis: Active Problems:   Schizophrenia ([facility])   Schizoaffective disorder, depressive type ([place])  Total Time spent with patient: 20 minutes  Past Medical History:  Past Medical History:  Diagnosis Date  . Anxiety   . Headache     Past Surgical History:  Procedure Laterality Date  . APPENDECTOMY    . TIBIA FRACTURE SURGERY     right   Family History: History reviewed. No pertinent family history. Family Psychiatric  History: No new data  social History:  Social History   Substance and Sexual Activity  Alcohol Use Yes   Comment: weekly     Social History   Substance and Sexual Activity  Drug Use Yes  . Types: Marijuana, "Crack" cocaine   Comment: hx 2015 cocaine    Social History   Socioeconomic History  . Marital status: Single    Spouse name: Not on file  . Number of children: Not on file  . Years of education: Not on file  . Highest education level: Not on file  Occupational History  . Not on file  Social Needs  . Financial resource strain: Not on file  . Food insecurity:    Worry: Not on file    Inability: Not on file  . Transportation needs:    Medical: Not on file    Non-medical: Not on file  Tobacco Use  . Smoking status: Current Some Day Smoker    Types: Cigars  . Smokeless tobacco: Never Used  Substance and Sexual Activity  . Alcohol use: Yes   Comment: weekly  . Drug use: Yes    Types: Marijuana, "Crack" cocaine    Comment: hx 2015 cocaine  . Sexual activity: Yes  Lifestyle  . Physical activity:    Days per week: Not on file    Minutes per session: Not on file  . Stress: Not on file  Relationships  . Social connections:    Talks on phone: Not on file    Gets together: Not on file    Attends religious service: Not on file    Active member of club or organization: Not on file    Attends meetings of clubs or organizations: Not on file    Relationship status: Not on file  Other Topics Concern  . Not on file  Social History Narrative  . Not on file   Sleep: Good  Appetite:  Good  Current Medications: Current Facility-Administered Medications  Medication Dose Route Frequency Provider Last Rate Last Dose  . acetaminophen (TYLENOL) tablet 650 mg  650 mg Oral Q6H PRN Ethelene Hal, NP   650 mg at 03/11/18 1018  . alum & mag hydroxide-simeth (MAALOX/MYLANTA) 200-200-20 MG/5ML suspension 30 mL  30 mL Oral Q4H PRN Ethelene Hal, NP   30 mL at 03/13/18 2115  . bacitracin ointment   Topical Daily PRN Ethelene Hal, NP      .  baclofen (LIORESAL) tablet 20 mg  20 mg Oral TID Johnn Hai, MD      . benztropine (COGENTIN) tablet 0.5 mg  0.5 mg Oral QHS Ethelene Hal, NP   0.5 mg at 03/13/18 2117  . cariprazine (VRAYLAR) capsule 3 mg  3 mg Oral Daily Johnn Hai, MD   3 mg at 03/14/18 0803  . FLUoxetine (PROZAC) capsule 60 mg  60 mg Oral Daily Johnn Hai, MD   60 mg at 03/14/18 0804  . magnesium hydroxide (MILK OF MAGNESIA) suspension 30 mL  30 mL Oral Daily PRN Ethelene Hal, NP      . nicotine polacrilex (NICORETTE) gum 2 mg  2 mg Oral PRN Sharma Covert, MD   2 mg at 03/13/18 0804  . OLANZapine (ZYPREXA) tablet 5 mg  5 mg Oral BID PRN Derrill Center, NP   5 mg at 03/12/18 1010  . ondansetron (ZOFRAN) tablet 4 mg  4 mg Oral Q8H PRN Ethelene Hal, NP      . prazosin  (MINIPRESS) capsule 1 mg  1 mg Oral QHS Sharma Covert, MD   1 mg at 03/13/18 2117  . traZODone (DESYREL) tablet 200 mg  200 mg Oral QHS Derrill Center, NP   200 mg at 03/13/18 2117    Lab Results: No results found for this or any previous visit (from the past 48 hour(s)).  Blood Alcohol level:  Lab Results  Component Value Date   ETH <10 03/09/2018   ETH <10 16/04/930    Metabolic Disorder Labs: No results found for: HGBA1C, MPG No results found for: PROLACTIN No results found for: CHOL, TRIG, HDL, CHOLHDL, VLDL, LDLCALC  Physical Findings: AIMS: Facial and Oral Movements Muscles of Facial Expression: None, normal Lips and Perioral Area: None, normal Jaw: None, normal Tongue: None, normal,Extremity Movements Upper (arms, wrists, hands, fingers): None, normal Lower (legs, knees, ankles, toes): None, normal, Trunk Movements Neck, shoulders, hips: None, normal, Overall Severity Severity of abnormal movements (highest score from questions above): None, normal Incapacitation due to abnormal movements: None, normal Patient's awareness of abnormal movements (rate only patient's report): No Awareness, Dental Status Current problems with teeth and/or dentures?: No Does patient usually wear dentures?: No  CIWA:    COWS:     Musculoskeletal: Strength & Muscle Tone: within normal limits Gait & Station: normal Patient leans: N/A  Psychiatric Specialty Exam: Physical Exam  ROS  Blood pressure 98/72, pulse 87, temperature (!) 97.5 F (36.4 C), temperature source Oral, resp. rate 16, height 5\' 8"  (1.727 m), weight 77.6 kg.Body mass index is 26 kg/m.  General Appearance: Casual  Eye Contact:  Good  Speech:  Clear and Coherent  Volume:  Normal  Mood:  Euthymic  Affect:  Appropriate  Thought Process:  Coherent and Goal Directed  Orientation:  Full (Time, Place, and Person)  Thought Content:  Logical  Suicidal Thoughts:  No  Homicidal Thoughts:  No  Memory:  Immediate;    Good  Judgement:  Good  Insight:  Good  Psychomotor Activity:  Normal  Concentration:  Concentration: Good  Recall:  Good  Fund of Knowledge:  Good  Language:  Good  Akathisia:  Negative  Handed:  Right  AIMS (if indicated):     Assets:  Desire for Improvement Financial Resources/Insurance  ADL's:  Intact  Cognition:  WNL  Sleep:  Number of Hours: 6.75    Continue current meds with the addition of baclofen and discontinuation of amantadine  continue reality based therapy await rehab  Treatment Plan Summary: Daily contact with patient to assess and evaluate symptoms and progress in treatment and Medication management  Johnn Hai, MD 03/14/2018, 9:12 AM

## 2018-03-14 NOTE — Progress Notes (Signed)
Patient decided he is going to [facility]. He became agitated earlier in the day when attending group.  He has asked to speak with SW numerous times.  Patient also requested to speak with MD.  He was requesting to be discharged immediately.  Spoke with patient at length about his situation and explained that he would not be discharged today.  He states, "I wanted to speak with Nira Conn to tell her that I changed my mind."  "I'm going to a [facility]. I'm not doing this over a cigarette." Patient can become easily agitated.  He does understand that it is not a good idea for him to be out on the streets for his safety.

## 2018-03-14 NOTE — Progress Notes (Addendum)
CSW spoke with pt individually after group concluded due to pt's escalation during group. He stated that he wanted to leave immediately and would secure himself a ride to [facility] tomorrow. CSW explained that he has not been accepted yet. CSW and pt contact Amy in admissions who shared that pt is still in review. Pt continues to request "discharge immediately," stating, "I want to walk around outside for a bit. I'm like a caged animal here." Pt admits to craving cigarette but states that he just wants to discharge home for a night. CSW contacted pt's guardian, who stated that pt has drug dealers and gang members looking for him and asserts "he will be killed if he discharges." Guardian continues to request [facility] referral, which he is aware is pending currently. CSW told pt to talk to his brother/guardian. After conversation, pt demanded to discharge and when told that would not be possible, told CSW to cancel his referral to [facility]. "I'm not going. Yeah it's over a cigarette." Pt presents with agitated mood and anxious affect, possibly craving. Pt was not reponsive to redirection by CSW and continues to demand to speak with the MD, despite MD leaving for the day, and called CSW to get MD's cell number. CSW told pt that he would have to wait until morning to speak with MD if he has left for the day and that we could not give out his personal cell. Pt is pushy and demanding with staff at this time.   Pt's brother/guradian called CSW back again--and was provided with above information. Guardian requests that for the safety of the pt, he not get discharge until guardian is back in town (currently in [place]) on Friday 12/13. CSW to discuss discharge plan with MD during progression tomorrow morning. Pt continues to present with agitated mood.   Shaquitta Burbridge S. Ouida Sills, MSW, LCSW Clinical Social Worker 03/14/2018 3:23 PM

## 2018-03-14 NOTE — Plan of Care (Signed)
Patient was insistent upon leaving stating he wanted to smoke and stated he did not have drug cravings however he scheduled to go to rehabilitation tomorrow at [facility] Further he has a guardian so it may not be obtained whether he lives or not would not want to disobey the guardian's wishes medically since he has a pattern of showing a lack of responsibility and an inability to stay away from drugs. Left a message for the guardian but the bottom line is I think the patient need to stay here till tomorrow

## 2018-03-14 NOTE — Plan of Care (Signed)
  Problem: Education: Goal: Will be free of psychotic symptoms Outcome: Progressing Note:  [place] reported that his voices continue but are getting better.

## 2018-03-15 NOTE — Progress Notes (Signed)
The patient attended the evening A.A.meeting and was appropriate.  

## 2018-03-15 NOTE — BHH Group Notes (Signed)
[facility] Group Therapy 03/15/2018 1:15pm  Type of Therapy: Mental Health Association Presentation  Participation Level: Active  Participation Quality: Attentive  Affect: Appropriate  Cognitive: Oriented  Insight: Developing/Improving  Engagement in Therapy: Engaged  Modes of Intervention: Discussion, Education and Socialization  Summary of Progress/Problems: [place] ([place]) Speaker came to talk about his personal journey with mental health. The pt processed ways by which to relate to the speaker. [place] speaker provided handouts and educational information pertaining to groups and services offered by the [facility]. Pt was engaged in speaker's presentation and was receptive to resources provided.    Avelina Laine, LCSW 03/15/2018 2:19 PM

## 2018-03-15 NOTE — BHH Suicide Risk Assessment (Signed)
[facility] Discharge Suicide Risk Assessment   Principal Problem: Relapse/substance-induced mood disorder as well as the below diagnoses Discharge Diagnoses: Active Problems:   Schizophrenia ([place])   Schizoaffective disorder, depressive type ([place])   Total Time spent with patient: 45 minutes  Musculoskeletal: Strength & Muscle Tone: within normal limits Gait & Station: normal   Psychiatric Specialty Exam: ROS  Blood pressure (!) 123/58, pulse 81, temperature (!) 97.5 F (36.4 C), temperature source Oral, resp. rate 16, height 5\' 8"  (1.727 m), weight 77.6 kg.Body mass index is 26 kg/m.  General Appearance: Casual  Eye Contact::  Good  Speech:  Clear and Coherent409  Volume:  Normal-normal and euthymic speech is normal rate and tone affect is appropriate  Thought Process:  Coherent  Orientation:  Full (Time, Place, and Person)  Thought Content:  Logical  Suicidal Thoughts:  No  Homicidal Thoughts:  No  Memory:  Immediate;   Good  Judgement:  Good  Insight:  Good  Psychomotor Activity:  Normal  Concentration:  Good  Recall:  Good  Fund of Knowledge:Good  Language: Good  Akathisia:  Negative  Handed:  Right  AIMS (if indicated):     Assets:  Communication Skills  Sleep:  Number of Hours: 6.75  Cognition: WNL  ADL's:  Intact   Mental Status Per Nursing Assessment::   On Admission:  Suicidal ideation indicated by patient, Self-harm thoughts  Demographic Factors:  Male  Loss Factors: Decrease in vocational status  Historical Factors: Prior suicide attempts  Risk Reduction Factors:   NA  Continued Clinical Symptoms:  Depression:   Severe  Cognitive Features That Contribute To Risk:  None    Suicide Risk:  Minimal: No identifiable suicidal ideation.  Patients presenting with no risk factors but with morbid ruminations; may be classified as minimal risk based on the severity of the depressive symptoms  Follow-up Information    [facility]. Go on 03/18/2018.   Why:   Your hospital follow up appointment is Friday, 03/18/18 at 8:30a.  Please bring: photo ID, proof of insurance, and any discharge paperwork from this hospitalization.  Contact information: [place] [address] [place], [place].   Why:  referral made 12/5 Contact information: [address] [place] [address] [phone]           Plan Of Care/Follow-up recommendations:  Activity:  full  Jaymir Struble, MD 03/15/2018, 8:23 AM

## 2018-03-15 NOTE — Plan of Care (Signed)
  Problem: Education: Goal: Emotional status will improve Outcome: Adequate for Discharge   Problem: Education: Goal: Mental status will improve Outcome: Adequate for Discharge   Problem: Education: Goal: Verbalization of understanding the information provided will improve Outcome: Adequate for Discharge   Problem: Activity: Goal: Interest or engagement in activities will improve Outcome: Adequate for Discharge

## 2018-03-15 NOTE — Progress Notes (Signed)
[facility] MD Progress Note  03/15/2018 8:44 AM [name]  MRN:  [id number] Subjective:   After much discussion yesterday with patient as well as today and discussions with his brother he understands he has to stay here until rehab is arranged he is denying current suicidal thoughts. Reporting some degree of cravings but can manage them. Again more cooperative Principal Problem: Cocaine dependency and related mood issues Diagnosis: Active Problems:   Schizophrenia ([facility])   Schizoaffective disorder, depressive type ([place])  Total Time spent with patient: 20 minutes   Past Medical History:  Past Medical History:  Diagnosis Date  . Anxiety   . Headache     Past Surgical History:  Procedure Laterality Date  . APPENDECTOMY    . TIBIA FRACTURE SURGERY     right   Family History: History reviewed. No pertinent family history.Social History:  Social History   Substance and Sexual Activity  Alcohol Use Yes   Comment: weekly     Social History   Substance and Sexual Activity  Drug Use Yes  . Types: Marijuana, "Crack" cocaine   Comment: hx 2015 cocaine    Social History   Socioeconomic History  . Marital status: Single    Spouse name: Not on file  . Number of children: Not on file  . Years of education: Not on file  . Highest education level: Not on file  Occupational History  . Not on file  Social Needs  . Financial resource strain: Not on file  . Food insecurity:    Worry: Not on file    Inability: Not on file  . Transportation needs:    Medical: Not on file    Non-medical: Not on file  Tobacco Use  . Smoking status: Current Some Day Smoker    Types: Cigars  . Smokeless tobacco: Never Used  Substance and Sexual Activity  . Alcohol use: Yes    Comment: weekly  . Drug use: Yes    Types: Marijuana, "Crack" cocaine    Comment: hx 2015 cocaine  . Sexual activity: Yes  Lifestyle  . Physical activity:    Days per week: Not on file    Minutes per session: Not on  file  . Stress: Not on file  Relationships  . Social connections:    Talks on phone: Not on file    Gets together: Not on file    Attends religious service: Not on file    Active member of club or organization: Not on file    Attends meetings of clubs or organizations: Not on file    Relationship status: Not on file  Other Topics Concern  . Not on file  Social History Narrative  . Not on file     Sleep: Fair  Appetite:  Fair  Current Medications: Current Facility-Administered Medications  Medication Dose Route Frequency Provider Last Rate Last Dose  . acetaminophen (TYLENOL) tablet 650 mg  650 mg Oral Q6H PRN Ethelene Hal, NP   650 mg at 03/14/18 2102  . alum & mag hydroxide-simeth (MAALOX/MYLANTA) 200-200-20 MG/5ML suspension 30 mL  30 mL Oral Q4H PRN Ethelene Hal, NP   30 mL at 03/13/18 2115  . bacitracin ointment   Topical Daily PRN Ethelene Hal, NP      . baclofen (LIORESAL) tablet 20 mg  20 mg Oral TID Johnn Hai, MD   20 mg at 03/15/18 0807  . benztropine (COGENTIN) tablet 0.5 mg  0.5 mg Oral QHS Jinny Blossom  Toribio Harbour, NP   0.5 mg at 03/14/18 2101  . cariprazine (VRAYLAR) capsule 3 mg  3 mg Oral Daily Johnn Hai, MD   3 mg at 03/15/18 9892  . FLUoxetine (PROZAC) capsule 60 mg  60 mg Oral Daily Johnn Hai, MD   60 mg at 03/15/18 1194  . magnesium hydroxide (MILK OF MAGNESIA) suspension 30 mL  30 mL Oral Daily PRN Ethelene Hal, NP      . nicotine polacrilex (NICORETTE) gum 2 mg  2 mg Oral PRN Sharma Covert, MD   2 mg at 03/13/18 0804  . OLANZapine (ZYPREXA) tablet 5 mg  5 mg Oral BID PRN Derrill Center, NP   5 mg at 03/12/18 1010  . ondansetron (ZOFRAN) tablet 4 mg  4 mg Oral Q8H PRN Ethelene Hal, NP      . prazosin (MINIPRESS) capsule 1 mg  1 mg Oral QHS Sharma Covert, MD   1 mg at 03/14/18 2101  . traZODone (DESYREL) tablet 200 mg  200 mg Oral QHS Derrill Center, NP   200 mg at 03/14/18 2101    Lab Results: No  results found for this or any previous visit (from the past 48 hour(s)).  Blood Alcohol level:  Lab Results  Component Value Date   ETH <10 03/09/2018   ETH <10 17/40/8144    Metabolic Disorder Labs: No results found for: HGBA1C, MPG No results found for: PROLACTIN No results found for: CHOL, TRIG, HDL, CHOLHDL, VLDL, LDLCALC  Physical Findings: AIMS: Facial and Oral Movements Muscles of Facial Expression: None, normal Lips and Perioral Area: None, normal Jaw: None, normal Tongue: None, normal,Extremity Movements Upper (arms, wrists, hands, fingers): None, normal Lower (legs, knees, ankles, toes): None, normal, Trunk Movements Neck, shoulders, hips: None, normal, Overall Severity Severity of abnormal movements (highest score from questions above): None, normal Incapacitation due to abnormal movements: None, normal Patient's awareness of abnormal movements (rate only patient's report): No Awareness, Dental Status Current problems with teeth and/or dentures?: No Does patient usually wear dentures?: No  CIWA:    COWS:     Musculoskeletal: Strength & Muscle Tone: within normal limits Gait & Station: normal Patient leans: N/A  Psychiatric Specialty Exam: Physical Exam  ROS  Blood pressure (!) 123/58, pulse 81, temperature (!) 97.5 F (36.4 C), temperature source Oral, resp. rate 16, height 5\' 8"  (1.727 m), weight 77.6 kg.Body mass index is 26 kg/m.  General Appearance: Casual  Eye Contact:  Good  Speech:  Clear and Coherent  Volume:  Normal  Mood:  Euthymic  Affect:  Congruent  Thought Process:  Coherent  Orientation:  Full (Time, Place, and Person)  Thought Content:  Logical  Suicidal Thoughts:  No  Homicidal Thoughts:  No  Memory:  Immediate;   Good  Judgement:  Good  Insight:  Good  Psychomotor Activity:  Normal  Concentration:  Concentration: Good  Recall:  Good  Fund of Knowledge:  Good  Language:  Good  Akathisia:  Negative  Handed:  Right  AIMS (if  indicated):     Assets:  Communication Skills  ADL's:  Intact  Cognition:  WNL  Sleep:  Number of Hours: 6.75   Again staying here until rehab can be a direct transfer no change in meds Treatment Plan Summary: Daily contact with patient to assess and evaluate symptoms and progress in treatment and Medication management  Ellice Boultinghouse, MD 03/15/2018, 8:44 AM

## 2018-03-15 NOTE — Progress Notes (Signed)
Updated progress notes faxed to Amy at [facility] per her request.   Esau Grew. Ouida Sills, MSW, LCSW Clinical Social Worker 03/15/2018 8:20 AM

## 2018-03-16 MED ORDER — IBUPROFEN 600 MG PO TABS
600.0000 mg | ORAL_TABLET | Freq: Four times a day (QID) | ORAL | Status: DC | PRN
  Administered 2018-03-16 – 2018-03-17 (×3): 600 mg via ORAL
  Filled 2018-03-16 (×3): qty 1

## 2018-03-16 MED ORDER — BUPROPION HCL ER (XL) 150 MG PO TB24
150.0000 mg | ORAL_TABLET | Freq: Every day | ORAL | Status: DC
  Administered 2018-03-16 – 2018-03-18 (×3): 150 mg via ORAL
  Filled 2018-03-16 (×5): qty 1

## 2018-03-16 MED ORDER — AMANTADINE HCL 100 MG PO CAPS
100.0000 mg | ORAL_CAPSULE | Freq: Two times a day (BID) | ORAL | Status: DC
  Administered 2018-03-16 – 2018-03-18 (×5): 100 mg via ORAL
  Filled 2018-03-16 (×9): qty 1

## 2018-03-16 NOTE — BHH Group Notes (Signed)
LCSW Group Therapy Note  03/16/2018 1:15pm  Type of Therapy/Topic:  Group Therapy:  Feelings about Diagnosis  Participation Level:  Did Not Attend--pt invited. Chose to remain in bed.    Description of Group:   This group will allow patients to explore their thoughts and feelings about diagnoses they have received. Patients will be guided to explore their level of understanding and acceptance of these diagnoses. Facilitator will encourage patients to process their thoughts and feelings about the reactions of others to their diagnosis and will guide patients in identifying ways to discuss their diagnosis with significant others in their lives. This group will be process-oriented, with patients participating in exploration of their own experiences, giving and receiving support, and processing challenge from other group members.   Therapeutic Goals: 1. Patient will demonstrate understanding of diagnosis as evidenced by identifying two or more symptoms of the disorder 2. Patient will be able to express two feelings regarding the diagnosis 3. Patient will demonstrate their ability to communicate their needs through discussion and/or role play  Summary of Patient Progress:       Therapeutic Modalities:   Cognitive Behavioral Therapy Brief Therapy Feelings Identification    Avelina Laine, LCSW 03/16/2018 11:33 AM

## 2018-03-16 NOTE — Progress Notes (Signed)
CSW spoke with Shayla at [facility] informed CSW that they cannot admit patients with legal guardians. Therefore, pt declined at this facility. Pt has assessment for medication management and CDIOP with the [facility].   Shervin Cypert S. Ouida Sills, MSW, LCSW Clinical Social Worker 03/16/2018 3:27 PM

## 2018-03-16 NOTE — Progress Notes (Signed)
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  Were going to look into a couple of other options and at the very least he may benefit from a partial program.  He is taking this in stride he benefited from the pharmacy group and is requesting specifically to try Wellbutrin and amantadine for cravings I do have him on the amantadine at any rate he is alert oriented cooperative focused on his medications denies current but reports recent cravings denies wanting to harm self or others denies any psychotic symptoms.  Seen with his caseworker to discuss treatment options Principal Problem: <principal problem not specified> Diagnosis: Active Problems:   Schizophrenia ([place])   Schizoaffective disorder, depressive type ([place])  Total Time spent with patient: 20 minutes  Past Medical History:  Past Medical History:  Diagnosis Date  . Anxiety   . Headache     Past Surgical History:  Procedure Laterality Date  . APPENDECTOMY    . TIBIA FRACTURE SURGERY     right   Family History: History reviewed. No pertinent family history.  Social History:  Social History   Substance and Sexual Activity  Alcohol Use Yes   Comment: weekly     Social History   Substance and Sexual Activity  Drug Use Yes  . Types: Marijuana, "Crack" cocaine   Comment: hx 2015 cocaine    Social History   Socioeconomic History  . Marital status: Single    Spouse name: Not on file  . Number of children: Not on file  . Years of education: Not on file  . Highest education level: Not on file  Occupational History  . Not on file  Social Needs  . Financial resource strain: Not on file  . Food insecurity:    Worry: Not on file    Inability: Not on file  . Transportation needs:    Medical:  Not on file    Non-medical: Not on file  Tobacco Use  . Smoking status: Current Some Day Smoker    Types: Cigars  . Smokeless tobacco: Never Used  Substance and Sexual Activity  . Alcohol use: Yes    Comment: weekly  . Drug use: Yes    Types: Marijuana, "Crack" cocaine    Comment: hx 2015 cocaine  . Sexual activity: Yes  Lifestyle  . Physical activity:    Days per week: Not on file    Minutes per session: Not on file  . Stress: Not on file  Relationships  . Social connections:    Talks on phone: Not on file    Gets together: Not on file    Attends religious service: Not on file    Active member of club or organization: Not on file    Attends meetings of clubs or organizations: Not on file    Relationship status: Not on file  Other Topics Concern  . Not on file  Social History Narrative  . Not on file   Additional Social History:                         Sleep: Good  Appetite:  Good  Current Medications: Current Facility-Administered Medications  Medication Dose Route Frequency Provider Last Rate Last Dose  . acetaminophen (TYLENOL) tablet 650  mg  650 mg Oral Q6H PRN Ethelene Hal, NP   650 mg at 03/14/18 2102  . alum & mag hydroxide-simeth (MAALOX/MYLANTA) 200-200-20 MG/5ML suspension 30 mL  30 mL Oral Q4H PRN Ethelene Hal, NP   30 mL at 03/13/18 2115  . bacitracin ointment   Topical Daily PRN Ethelene Hal, NP      . baclofen (LIORESAL) tablet 20 mg  20 mg Oral TID Johnn Hai, MD   20 mg at 03/16/18 0816  . benztropine (COGENTIN) tablet 0.5 mg  0.5 mg Oral QHS Ethelene Hal, NP   0.5 mg at 03/15/18 2152  . cariprazine (VRAYLAR) capsule 3 mg  3 mg Oral Daily Johnn Hai, MD   3 mg at 03/16/18 0816  . FLUoxetine (PROZAC) capsule 60 mg  60 mg Oral Daily Johnn Hai, MD   60 mg at 03/16/18 0815  . magnesium hydroxide (MILK OF MAGNESIA) suspension 30 mL  30 mL Oral Daily PRN Ethelene Hal, NP      . nicotine polacrilex  (NICORETTE) gum 2 mg  2 mg Oral PRN Sharma Covert, MD   2 mg at 03/15/18 2111  . OLANZapine (ZYPREXA) tablet 5 mg  5 mg Oral BID PRN Derrill Center, NP   5 mg at 03/12/18 1010  . ondansetron (ZOFRAN) tablet 4 mg  4 mg Oral Q8H PRN Ethelene Hal, NP      . prazosin (MINIPRESS) capsule 1 mg  1 mg Oral QHS Sharma Covert, MD   1 mg at 03/15/18 2114  . traZODone (DESYREL) tablet 200 mg  200 mg Oral QHS Derrill Center, NP   200 mg at 03/15/18 2114    Lab Results: No results found for this or any previous visit (from the past 48 hour(s)).  Blood Alcohol level:  Lab Results  Component Value Date   ETH <10 03/09/2018   ETH <10 16/01/9603    Metabolic Disorder Labs: No results found for: HGBA1C, MPG No results found for: PROLACTIN No results found for: CHOL, TRIG, HDL, CHOLHDL, VLDL, LDLCALC  Physical Findings: AIMS: Facial and Oral Movements Muscles of Facial Expression: None, normal Lips and Perioral Area: None, normal Jaw: None, normal Tongue: None, normal,Extremity Movements Upper (arms, wrists, hands, fingers): None, normal Lower (legs, knees, ankles, toes): None, normal, Trunk Movements Neck, shoulders, hips: None, normal, Overall Severity Severity of abnormal movements (highest score from questions above): None, normal Incapacitation due to abnormal movements: None, normal Patient's awareness of abnormal movements (rate only patient's report): No Awareness, Dental Status Current problems with teeth and/or dentures?: No Does patient usually wear dentures?: No  CIWA:    COWS:     Musculoskeletal: Strength & Muscle Tone: within normal limits Gait & Station: normal Patient leans: N/A  Psychiatric Specialty Exam: Physical Exam  ROS  Blood pressure 93/67, pulse 96, temperature (!) 97.5 F (36.4 C), temperature source Oral, resp. rate 16, height 5\' 8"  (1.727 m), weight 77.6 kg.Body mass index is 26 kg/m.  General Appearance: Casual  Eye Contact:  Good   Speech:  Clear and Coherent  Volume:  Normal  Mood:  stable  Affect:  Congruent  Thought Process:  Goal Directed  Orientation:  Full (Time, Place, and Person)  Thought Content:  Logical  Suicidal Thoughts:  No  Homicidal Thoughts:  No  Memory:  Immediate;   Good  Judgement:  Good  Insight:  Good  Psychomotor Activity:  Normal  Concentration:  Concentration: Good  Recall:  Good  Fund of Knowledge:  Good  Language:  Good  Akathisia:  Negative  Handed:  Right  AIMS (if indicated):     Assets:  Communication Skills Desire for Improvement  ADL's:  Intact  Cognition:  wnl  Sleep:  Number of Hours: 5.75   Try new craving medication/continue to seek rehab options  Treatment Plan Summary: Daily contact with patient to assess and evaluate symptoms and progress in treatment and Medication management  Thorsten Climer, MD 03/16/2018, 10:30 AM

## 2018-03-16 NOTE — Progress Notes (Signed)
Pt declined at [facility] outpatient, Partial Hospitalization Program at [facility] outpatient. Pt referred to [facility] and has assessment for CDIOP and medication management on 12/16 at 11:00AM. Pt also referred to [facility]--message left with [facility] in admissions this afternoon to check status of this referral.  Esau Grew. Ouida Sills, MSW, LCSW Clinical Social Worker 03/16/2018 2:21 PM

## 2018-03-16 NOTE — Progress Notes (Signed)
Patient did not attend the evening speaker NA meeting. Pt was notified that group was beginning but remained in bed.   

## 2018-03-16 NOTE — Progress Notes (Signed)
Amy from [facility] admissions called this morning. Stated that pt was reviewed by MD on staff and was declined admission due to "not being an appropriate fit."   Ronnetta Currington S. Ouida Sills, MSW, LCSW Clinical Social Worker 03/16/2018 8:24 AM

## 2018-03-16 NOTE — Tx Team (Signed)
Interdisciplinary Treatment and Diagnostic Plan Update  03/16/2018 Time of Session: [place] MRN: [id number]  Principal Diagnosis: Schizophrenia  Secondary Diagnoses: Active Problems:   Schizophrenia ([place])   Schizoaffective disorder, depressive type ([place])   Current Medications:  Current Facility-Administered Medications  Medication Dose Route Frequency Provider Last Rate Last Dose  . acetaminophen (TYLENOL) tablet 650 mg  650 mg Oral Q6H PRN Ethelene Hal, NP   650 mg at 03/14/18 2102  . alum & mag hydroxide-simeth (MAALOX/MYLANTA) 200-200-20 MG/5ML suspension 30 mL  30 mL Oral Q4H PRN Ethelene Hal, NP   30 mL at 03/13/18 2115  . bacitracin ointment   Topical Daily PRN Ethelene Hal, NP      . baclofen (LIORESAL) tablet 20 mg  20 mg Oral TID Johnn Hai, MD   20 mg at 03/16/18 0816  . benztropine (COGENTIN) tablet 0.5 mg  0.5 mg Oral QHS Ethelene Hal, NP   0.5 mg at 03/15/18 2152  . cariprazine (VRAYLAR) capsule 3 mg  3 mg Oral Daily Johnn Hai, MD   3 mg at 03/16/18 0816  . FLUoxetine (PROZAC) capsule 60 mg  60 mg Oral Daily Johnn Hai, MD   60 mg at 03/16/18 0815  . magnesium hydroxide (MILK OF MAGNESIA) suspension 30 mL  30 mL Oral Daily PRN Ethelene Hal, NP      . nicotine polacrilex (NICORETTE) gum 2 mg  2 mg Oral PRN Sharma Covert, MD   2 mg at 03/15/18 2111  . OLANZapine (ZYPREXA) tablet 5 mg  5 mg Oral BID PRN Derrill Center, NP   5 mg at 03/12/18 1010  . ondansetron (ZOFRAN) tablet 4 mg  4 mg Oral Q8H PRN Ethelene Hal, NP      . prazosin (MINIPRESS) capsule 1 mg  1 mg Oral QHS Sharma Covert, MD   1 mg at 03/15/18 2114  . traZODone (DESYREL) tablet 200 mg  200 mg Oral QHS Derrill Center, NP   200 mg at 03/15/18 2114   PTA Medications: Medications Prior to Admission  Medication Sig Dispense Refill Last Dose  . benztropine (COGENTIN) 0.5 MG tablet Take 0.5 mg by mouth at bedtime.   Past Month at  Unknown time  . FLUoxetine (PROZAC) 40 MG capsule Take 40 mg by mouth daily.   Past Week at Unknown time  . INVEGA TRINZA 546 MG/1.75ML SUSY Inject 546 mg into the skin every 3 (three) months.   Past Month at Unknown time  . prazosin (MINIPRESS) 1 MG capsule Take 1 mg by mouth daily.   Past Week at Unknown time  . traZODone (DESYREL) 50 MG tablet Take 50 mg by mouth at bedtime.   Past Week at Unknown time    Patient Stressors: Financial difficulties Substance abuse  Patient Strengths: Ability for insight Average or above average intelligence Capable of independent living [employer] of knowledge Motivation for treatment/growth Supportive family/friends  Treatment Modalities: Medication Management, Group therapy, Case management,  1 to 1 session with clinician, Psychoeducation, Recreational therapy.   Physician Treatment Plan for Primary Diagnosis: Schizophrenia Long Term Goal(s): Improvement in symptoms so as ready for discharge Improvement in symptoms so as ready for discharge   Short Term Goals: Ability to identify changes in lifestyle to reduce recurrence of condition will improve Ability to verbalize feelings will improve Ability to demonstrate self-control will improve Ability to identify and develop effective coping behaviors will improve Ability to identify changes in  lifestyle to reduce recurrence of condition will improve Ability to verbalize feelings will improve  Medication Management: Evaluate patient's response, side effects, and tolerance of medication regimen.  Therapeutic Interventions: 1 to 1 sessions, Unit Group sessions and Medication administration.  Evaluation of Outcomes: Progressing  Physician Treatment Plan for Secondary Diagnosis: Active Problems:   Schizophrenia ([place])   Schizoaffective disorder, depressive type ([place])  Long Term Goal(s): Improvement in symptoms so as ready for discharge Improvement in symptoms so as ready for discharge   Short  Term Goals: Ability to identify changes in lifestyle to reduce recurrence of condition will improve Ability to verbalize feelings will improve Ability to demonstrate self-control will improve Ability to identify and develop effective coping behaviors will improve Ability to identify changes in lifestyle to reduce recurrence of condition will improve Ability to verbalize feelings will improve     Medication Management: Evaluate patient's response, side effects, and tolerance of medication regimen.  Therapeutic Interventions: 1 to 1 sessions, Unit Group sessions and Medication administration.  Evaluation of Outcomes: Progressing   RN Treatment Plan for Primary Diagnosis: Schizophrenia Long Term Goal(s): Knowledge of disease and therapeutic regimen to maintain health will improve  Short Term Goals: Ability to remain free from injury will improve, Ability to verbalize feelings will improve and Ability to disclose and discuss suicidal ideas  Medication Management: RN will administer medications as ordered by provider, will assess and evaluate patient's response and provide education to patient for prescribed medication. RN will report any adverse and/or side effects to prescribing provider.  Therapeutic Interventions: 1 on 1 counseling sessions, Psychoeducation, Medication administration, Evaluate responses to treatment, Monitor vital signs and CBGs as ordered, Perform/monitor CIWA, COWS, AIMS and Fall Risk screenings as ordered, Perform wound care treatments as ordered.  Evaluation of Outcomes: Progressing   LCSW Treatment Plan for Primary Diagnosis:Schizophrenia Long Term Goal(s): Safe transition to appropriate next level of care at discharge, Engage patient in therapeutic group addressing interpersonal concerns.  Short Term Goals: Engage patient in aftercare planning with referrals and resources, Facilitate patient progression through stages of change regarding substance use diagnoses and  concerns and Identify triggers associated with mental health/substance abuse issues  Therapeutic Interventions: Assess for all discharge needs, 1 to 1 time with Social worker, Explore available resources and support systems, Assess for adequacy in community support network, Educate family and significant other(s) on suicide prevention, Complete Psychosocial Assessment, Interpersonal group therapy.  Evaluation of Outcomes: Progressing   Progress in Treatment: Attending groups: Yes. Participating in groups: Yes. Taking medication as prescribed: Yes. Toleration medication: Yes. Family/Significant other contact made: Yes, individual(s) contacted:  pt's brother/guardian to complete SPE and obtain collateral information Patient understands diagnosis: Yes. Discussing patient identified problems/goals with staff: Yes. Medical problems stabilized or resolved: Yes. Denies suicidal/homicidal ideation: No--passive SI/able to contract for safety on the unit.  Issues/concerns per patient self-inventory: No. Other: n/a   New problem(s) identified: No, Describe:  n/a  New Short Term/Long Term Goal(s): detox, elimination of AVH, medication management for mood stabilization; elimination of SI thoughts; development of comprehensive mental wellness/sobriety plan.   Patient Goals:  "I want to get rid of the voices and stop using crack."   Discharge Plan or Barriers: Pt was declined at [facility] due to "not being appropriate for the program. Pt referred to [facility] and agreeable to [facility] as plan B option. Pt has legal guardian-Mohammed Hubble-pt's brother, whom CSW has remained in contact with throughout pt's admission. CSW continuing to assess for appropriate referrals.  Reason for Continuation of Hospitalization: Anxiety Depression Medication stabilization  Estimated Length of Stay: Friday, 03/18/18  Attendees: Patient: 03/16/2018 9:12 AM  Physician: Dr. Jake Samples MD 03/16/2018 9:12 AM  Nursing: Clarise Cruz RN;  [place] RN 03/16/2018 9:12 AM  RN Care Manager:x 03/16/2018 9:12 AM  Social Worker: Janice Norrie LCSW 03/16/2018 9:12 AM  Recreational Therapist: x 03/16/2018 9:12 AM  Other: Lindell Spar NP 03/16/2018 9:12 AM  Other:  03/16/2018 9:12 AM  Other: 03/16/2018 9:12 AM    Scribe for Treatment Team: Avelina Laine, LCSW 03/16/2018 9:12 AM

## 2018-03-16 NOTE — Therapy (Signed)
Occupational Therapy Group Note  Date:  03/16/2018 Time:  11:46 AM  Group Topic/Focus:  Stress Management  Participation Level:  Active  Participation Quality:  Appropriate  Affect:  Blunted  Cognitive:  Appropriate  Insight: Improving  Engagement in Group:  Engaged  Modes of Intervention:  Activity, Discussion, Education and Socialization  Additional Comments:    S: "I always remove myself from the situation if I know I am going to get really mad. I have never lashed out at anybody"  O: Stress management group completed to use as productive coping strategy, to help mitigate maladaptive coping to integrate in functional BADL/IADL. Education given on the definition of stress and its cognitive, behavioral, emotional, and physical effects on the body. Stress symptom checklist completed. Stress management tool worksheet discussed to educate on unhealthy vs healthy coping skills to manage stress to improve community integration.   A: Pt presents with blunted affect, engaged and participatory throughout session. Pt completing symptom checklist, stating that he has a 20, signifying high stress. Pt completed stress management tools worksheet, stating that deep breathing is really helpful when he is coping with high emotions. He also mentions how his brother has been a helpful social support.  P: Pt provided with education on stress management activities to implement into daily routine. Handouts given to facilitate carryover when reintegrating into community    Zenovia Jarred, MSOT, OTR/L [facility] Office: [place] 03/16/2018, 11:46 AM

## 2018-03-16 NOTE — Progress Notes (Signed)
Pt reports his day was "ok".  He contracts for safety at this time, and denies HI/AVH.  He denies any withdrawal symptoms this evening.  Pt c/o back pain this evening.  He said he took something earlier in the day, but it did not help.  Writer assured him that a request for another medication would be made to the provider.  Order for Motrin 600mg  q6h prn received and given after verification.  Pt was also given a heat pack at that time for comfort.  Pt has been in and out of the dayroom throughout the evening.  He has made his needs known to staff.  Pt is pleasant and cooperative.  Support and encouragement offered.  Discharge plans are in process.  Safety maintained with q15 minute safety checks.

## 2018-03-16 NOTE — Progress Notes (Signed)
Recreation Therapy Notes  Date: 12.11.19 Time: 0930 Location: [address] Dayroom  Group Topic: Stress Management  Goal Area(s) Addresses:  Patient will verbalize importance of using healthy stress management.  Patient will identify positive emotions associated with healthy stress management.   Intervention: Stress Management  Activity : Meditation.  LRT introduced the stress management technique of meditation.  LRT played a meditation on being resilient.  Patients were to follow along as meditation played to engage in activity.  Education:  Stress Management, Discharge Planning.   Education Outcome: Acknowledges edcuation/In group clarification offered/Needs additional education  Clinical Observations/Feedback: Pt did not attend group.     Victorino Sparrow, LRT/CTRS         Ria Comment, Irlene Crudup A 03/16/2018 10:50 AM

## 2018-03-16 NOTE — Progress Notes (Addendum)
Patient ID: [name], male   DOB: [birth year], 37 y.o.   MRN: [id number]  D. Pt presents with a flat affect and anxious behavior. Pt currently denies SI/HI and VH and agrees to contact staff before acting on any harmful thoughts. Pt endorses ongoing AH. Pt preoccupied with medications and lower back pain. Pt initially requests Ibuprofen then reports that he has gastroenteritis and cannot take ibuprofen because it gives him reflux. Pt requests to be started on two medications that he learned about during pharmacology group.   A. Labs and vitals monitored. Pt given and educated on medications. Pt supported emotionally and encouraged to express concerns and ask questions. Pt concerns reiterated to MD, see MAR. Educated on CB, needs reinforcement. Easily offended when requests are not met.   R. Pt remains safe with 15 minute checks. Pt continues to focus on lower back pain and medication requests. Will continue POC.

## 2018-03-17 NOTE — Progress Notes (Signed)
D: Patient is apprehensive and anxious.  When asked about discharge tomorrow, he states, "something is going on.  I don't know."  Patient's brother is supposed to be here tomorrow to pick him up.  He states he doesn't know where he is going.  He denies any thoughts of self harm.  He rates his depression as a 3; hopelessness as a 2, and anxiety as a 1.  He reports withdrawal symptoms as chilling, cravings, and nausea.  A: Continue to monitor medication management and MD orders.  Safety checks completed every 15 minutes per protocol.  Offer support and encouragement as needed.  R: Patient is receptive to staff; his behavior is appropriate.

## 2018-03-17 NOTE — Progress Notes (Signed)
Adult Psychoeducational Group Note  Date:  03/17/2018 Time:  9:03 PM  Group Topic/Focus:  Wrap-Up Group:   The focus of this group is to help patients review their daily goal of treatment and discuss progress on daily workbooks.  Participation Level:  Active  Participation Quality:  Appropriate  Affect:  Appropriate  Cognitive:  Alert and Oriented  Insight: Appropriate  Engagement in Group:  Developing/Improving  Modes of Intervention:  Exploration and Support  Additional Comments:  Pt verbalized that his day was an 8. Pt verbalized that something was positive was that he didn't miss his medications. Pt verbalized that he has started to control his cravings. Pt verbalized that one coping skill that he can use is read a book.  Darrill Vreeland, Patrick North 03/17/2018, 9:03 PM

## 2018-03-17 NOTE — Plan of Care (Signed)
  Problem: Education: Goal: Emotional status will improve Outcome: Progressing Goal: Mental status will improve Outcome: Progressing   Problem: Activity: Goal: Sleeping patterns will improve Outcome: Progressing

## 2018-03-17 NOTE — Progress Notes (Signed)
CSW spoke with pt's brother who requested that pt discharge on Monday. CSW spoke with Dr. Jake Samples, who will be discharging pt on Friday, 12/13. Pt is denying SI/HI/AVH and is presenting at baseline, therefore, there is no reason to keep him longer. CSW called his brother/guardian back to inform him that pt will still be d/cing on Friday. Guardian will contact family to arrange transport home for Friday afternoon.  Mkayla Steele S. Ouida Sills, MSW, LCSW Clinical Social Worker 03/17/2018 1:02 PM

## 2018-03-17 NOTE — Progress Notes (Signed)
Pt reports he had a good day.  He says the the Motrin is helping his back pain.  He denies SI/HI/AVH.  He denies any withdrawal symptoms at this time.  He says the his brother is coming tomorrow to get him.  Pt voiced no needs or concerns.  He has remained in his room most of the evening, but did come out for group and to get a snack.  Pt is pleasant and cooperative.  Support and encouragement offered.  Discharge plans are in process.  Pt is supposed to leave with his brother, who is his guardian, tomorrow.  Safety maintained with q15 minute checks.

## 2018-03-17 NOTE — Progress Notes (Signed)
[facility] MD Progress Note  03/17/2018 12:49 PM [name]  MRN:  [id number] Subjective:    Patient understands that he has been turned out from admission to numerous inpatient rehab facilities given his diverse number of psychiatric admissions and diverse nature of his pathology some of which may be exaggerated at times but this distinction is not known or made clear in the charting of past admissions At any rate he reports no withdrawals no cravings understands his guardians expecting him to stay another day no thoughts of harming self or others contracting fully no acute psychosis no cravings tremors withdrawal no seizure activity  Principal Problem: <principal problem not specified> Diagnosis: Active Problems:   Schizophrenia ([place])   Schizoaffective disorder, depressive type ([place])  Total Time spent with patient: 20 minutes  Past Medical History:  Past Medical History:  Diagnosis Date  . Anxiety   . Headache     Past Surgical History:  Procedure Laterality Date  . APPENDECTOMY    . TIBIA FRACTURE SURGERY     right   Family History: History reviewed. No pertinent family history.  Social History:  Social History   Substance and Sexual Activity  Alcohol Use Yes   Comment: weekly     Social History   Substance and Sexual Activity  Drug Use Yes  . Types: Marijuana, "Crack" cocaine   Comment: hx 2015 cocaine    Social History   Socioeconomic History  . Marital status: Single    Spouse name: Not on file  . Number of children: Not on file  . Years of education: Not on file  . Highest education level: Not on file  Occupational History  . Not on file  Social Needs  . Financial resource strain: Not on file  . Food insecurity:    Worry: Not on file    Inability: Not on file  . Transportation needs:    Medical: Not on file    Non-medical: Not on file  Tobacco Use  . Smoking status: Current Some Day Smoker    Types: Cigars  . Smokeless tobacco: Never Used   Substance and Sexual Activity  . Alcohol use: Yes    Comment: weekly  . Drug use: Yes    Types: Marijuana, "Crack" cocaine    Comment: hx 2015 cocaine  . Sexual activity: Yes  Lifestyle  . Physical activity:    Days per week: Not on file    Minutes per session: Not on file  . Stress: Not on file  Relationships  . Social connections:    Talks on phone: Not on file    Gets together: Not on file    Attends religious service: Not on file    Active member of club or organization: Not on file    Attends meetings of clubs or organizations: Not on file    Relationship status: Not on file  Other Topics Concern  . Not on file  Social History Narrative  . Not on file   Additional Social History:                         Sleep: Good  Appetite:  Good  Current Medications: Current Facility-Administered Medications  Medication Dose Route Frequency Provider Last Rate Last Dose  . acetaminophen (TYLENOL) tablet 650 mg  650 mg Oral Q6H PRN Ethelene Hal, NP   650 mg at 03/16/18 1203  . alum & mag hydroxide-simeth (MAALOX/MYLANTA) 200-200-20 MG/5ML suspension 30 mL  30 mL Oral Q4H PRN Ethelene Hal, NP   30 mL at 03/13/18 2115  . amantadine (SYMMETREL) capsule 100 mg  100 mg Oral BID Johnn Hai, MD   100 mg at 03/17/18 0809  . bacitracin ointment   Topical Daily PRN Ethelene Hal, NP      . benztropine (COGENTIN) tablet 0.5 mg  0.5 mg Oral QHS Ethelene Hal, NP   0.5 mg at 03/16/18 2206  . buPROPion (WELLBUTRIN XL) 24 hr tablet 150 mg  150 mg Oral Daily Johnn Hai, MD   150 mg at 03/17/18 0810  . cariprazine (VRAYLAR) capsule 3 mg  3 mg Oral Daily Johnn Hai, MD   3 mg at 03/17/18 0810  . FLUoxetine (PROZAC) capsule 60 mg  60 mg Oral Daily Johnn Hai, MD   60 mg at 03/17/18 0809  . ibuprofen (ADVIL,MOTRIN) tablet 600 mg  600 mg Oral Q6H PRN Laverle Hobby, PA-C   600 mg at 03/16/18 2206  . magnesium hydroxide (MILK OF MAGNESIA) suspension  30 mL  30 mL Oral Daily PRN Ethelene Hal, NP      . nicotine polacrilex (NICORETTE) gum 2 mg  2 mg Oral PRN Sharma Covert, MD   2 mg at 03/15/18 2111  . OLANZapine (ZYPREXA) tablet 5 mg  5 mg Oral BID PRN Derrill Center, NP   5 mg at 03/16/18 1539  . ondansetron (ZOFRAN) tablet 4 mg  4 mg Oral Q8H PRN Ethelene Hal, NP      . prazosin (MINIPRESS) capsule 1 mg  1 mg Oral QHS Sharma Covert, MD   1 mg at 03/16/18 2206  . traZODone (DESYREL) tablet 200 mg  200 mg Oral QHS Derrill Center, NP   200 mg at 03/16/18 2206    Lab Results: No results found for this or any previous visit (from the past 48 hour(s)).  Blood Alcohol level:  Lab Results  Component Value Date   ETH <10 03/09/2018   ETH <10 40/98/1191    Metabolic Disorder Labs: No results found for: HGBA1C, MPG No results found for: PROLACTIN No results found for: CHOL, TRIG, HDL, CHOLHDL, VLDL, LDLCALC  Physical Findings: AIMS: Facial and Oral Movements Muscles of Facial Expression: None, normal Lips and Perioral Area: None, normal Jaw: None, normal Tongue: None, normal,Extremity Movements Upper (arms, wrists, hands, fingers): None, normal Lower (legs, knees, ankles, toes): None, normal, Trunk Movements Neck, shoulders, hips: None, normal, Overall Severity Severity of abnormal movements (highest score from questions above): None, normal Incapacitation due to abnormal movements: None, normal Patient's awareness of abnormal movements (rate only patient's report): No Awareness, Dental Status Current problems with teeth and/or dentures?: No Does patient usually wear dentures?: No  CIWA:    COWS:     Musculoskeletal: Strength & Muscle Tone: within normal limits Gait & Station: normal Patient leans: N/A  Psychiatric Specialty Exam: Physical Exam  ROS  Blood pressure 104/61, pulse 93, temperature 98 F (36.7 C), temperature source Oral, resp. rate 20, height 5\' 8"  (1.727 m), weight 77.6 kg.Body  mass index is 26 kg/m.  General Appearance: Casual  Eye Contact:  Good  Speech:  Clear and Coherent  Volume:  Normal  Mood:  Euthymic  Affect:  Congruent  Thought Process:  Coherent  Orientation:  Full (Time, Place, and Person)  Thought Content:  Logical  Suicidal Thoughts:  No  Homicidal Thoughts:  No  Memory:  NA  Judgement:  Good  Insight:  Good  Psychomotor Activity:  Normal  Concentration:  Concentration: Good  Recall:  Good  Fund of Knowledge:  Good  Language:  Good  Akathisia:  Negative  Handed:  Right  AIMS (if indicated):     Assets:  Physical Health Resilience  ADL's:  Intact  Cognition:  WNL  Sleep:  Number of Hours: 5.75    Past history of psychosis continue current meds and reality based therapy For suicidal thinking continue antidepressants and cognitive therapy For chemical dependency issues continue anti-craving medications continue rehab based therapy Treatment Plan Summary: Daily contact with patient to assess and evaluate symptoms and progress in treatment and Medication management  Johnn Hai, MD 03/17/2018, 12:49 PM

## 2018-03-18 MED ORDER — CARIPRAZINE HCL 3 MG PO CAPS: 3.0000 mg | ORAL_CAPSULE | Freq: Every day | ORAL | 0 refills | Status: DC

## 2018-03-18 MED ORDER — NICOTINE POLACRILEX 2 MG MT GUM: 2.0000 mg | CHEWING_GUM | OROMUCOSAL | 0 refills | Status: DC | PRN

## 2018-03-18 MED ORDER — FLUOXETINE HCL 20 MG PO CAPS: 60.0000 mg | ORAL_CAPSULE | Freq: Every day | ORAL | 0 refills | Status: DC

## 2018-03-18 MED ORDER — TRAZODONE HCL 100 MG PO TABS: 200.0000 mg | ORAL_TABLET | Freq: Every day | ORAL | 0 refills | Status: DC

## 2018-03-18 MED ORDER — IBUPROFEN 600 MG PO TABS: 600.0000 mg | ORAL_TABLET | Freq: Four times a day (QID) | ORAL | 0 refills | Status: DC | PRN

## 2018-03-18 MED ORDER — BUPROPION HCL ER (XL) 150 MG PO TB24: 150.0000 mg | ORAL_TABLET | Freq: Every day | ORAL | 0 refills | Status: DC

## 2018-03-18 MED ORDER — AMANTADINE HCL 100 MG PO CAPS: 100.0000 mg | ORAL_CAPSULE | Freq: Two times a day (BID) | ORAL | 0 refills | Status: DC

## 2018-03-18 MED ORDER — PRAZOSIN HCL 1 MG PO CAPS: 1.0000 mg | ORAL_CAPSULE | Freq: Every day | ORAL | 0 refills | Status: DC

## 2018-03-18 MED ORDER — BENZTROPINE MESYLATE 0.5 MG PO TABS: 0.5000 mg | ORAL_TABLET | Freq: Every day | ORAL | 0 refills | Status: DC

## 2018-03-18 NOTE — Progress Notes (Signed)
Recreation Therapy Notes  Date: 12.13.19 Time: 0930 Location: [address] Dayroom  Group Topic: Stress Management  Goal Area(s) Addresses:  Patient will verbalize importance of using healthy stress management.  Patient will identify positive emotions associated with healthy stress management.   Behavioral Response: Engaged  Intervention: Stress Management  Activity :  Meditation.  LRT introduced the stress management technique of meditation.  LRT played a meditation focused on taking on the characteristics of a mountain.  Patients were to listen as meditation played to engage in the activity.  Education:  Stress Management, Discharge Planning.   Education Outcome: Acknowledges edcuation/In group clarification offered/Needs additional education  Clinical Observations/Feedback: Pt attended and participated in activity.      Victorino Sparrow, LRT/CTRS         Victorino Sparrow A 03/18/2018 11:27 AM

## 2018-03-18 NOTE — Progress Notes (Signed)
Patient ID: [name], male   DOB: Oct 13, 1980, 37 y.o.   MRN: [id number]  D. Pt presents with an anxious affect and pleasant behavior. Pt reports that he feels "calm" and that he feels his medication regimen is working well for his depression and anxiety. Pt also reports that his auditory hallucinations have lessened and he has "not seen shadows in three days." Pt interacts pleasantly with staff and peers.   A. Labs and vitals monitored. Pt given and educated on medications. Pt supported emotionally and encouraged to express concerns and ask questions.    R. Pt remains safe with 15 minute checks.  Pt currently denies SI/HI and AVH and agrees to contact staff before acting on any harmful thoughts. Pt to be discharged home with brother. Will continue POC.

## 2018-03-18 NOTE — Discharge Summary (Signed)
Physician Discharge Summary Note  Patient:  [name] is an 37 y.o., male MRN:  [id number] DOB:  [birth year] Patient phone:  [phone] (home)  Patient address:   [place] [address],   Total Time spent with patient: Greater than 30 minutes  Date of Admission:  03/09/2018  Date of Discharge: 03-18-18  Reason for Admission: Multiple failed suicide attempts, increased substance & auditory hallucinations.  Principal Problem: Schizoaffective disorder, depressive type [facility])  Discharge Diagnoses: Principal Problem:   Schizoaffective disorder, depressive type ([place]) Active Problems:   Schizophrenia ([place])  Past Psychiatric History: Schizoaffective disorder, depressive-type  Past Medical History:  Past Medical History:  Diagnosis Date  . Anxiety   . Headache     Past Surgical History:  Procedure Laterality Date  . APPENDECTOMY    . TIBIA FRACTURE SURGERY     right   Family History: History reviewed. No pertinent family history.  Family Psychiatric  History: See H&P  Social History:  Social History   Substance and Sexual Activity  Alcohol Use Yes   Comment: weekly     Social History   Substance and Sexual Activity  Drug Use Yes  . Types: Marijuana, "Crack" cocaine   Comment: hx 2015 cocaine    Social History   Socioeconomic History  . Marital status: Single    Spouse name: Not on file  . Number of children: Not on file  . Years of education: Not on file  . Highest education level: Not on file  Occupational History  . Not on file  Social Needs  . Financial resource strain: Not on file  . Food insecurity:    Worry: Not on file    Inability: Not on file  . Transportation needs:    Medical: Not on file    Non-medical: Not on file  Tobacco Use  . Smoking status: Current Some Day Smoker    Types: Cigars  . Smokeless tobacco: Never Used  Substance and Sexual Activity  . Alcohol use: Yes    Comment: weekly  . Drug use:  Yes    Types: Marijuana, "Crack" cocaine    Comment: hx 2015 cocaine  . Sexual activity: Yes  Lifestyle  . Physical activity:    Days per week: Not on file    Minutes per session: Not on file  . Stress: Not on file  Relationships  . Social connections:    Talks on phone: Not on file    Gets together: Not on file    Attends religious service: Not on file    Active member of club or organization: Not on file    Attends meetings of clubs or organizations: Not on file    Relationship status: Not on file  Other Topics Concern  . Not on file  Social History Narrative  . Not on file   Hospital Course: (Per Md's admission evaluation): Mr. Lafavor is well-known to the service he is a 37 year old patient who reports to me exactly 4 attempts at harming himself in the past by overdose who states he called the crisis line and 911 because he felt suicidal.  He acknowledges cocaine dependency, acknowledges cannabis dependency, and some alcohol usage. At times he is reported auditory hallucinations he describes them to me is always inside not outside his head. There is an unusual incongruence despite him reporting he is depressed and suicidal he does smile and seemed very appropriate and euthymic in the interview. Stated his plans for  overdose or jump off a bridge he further could contract for safety but after the interview reported to the nurses that he simply could not contract for safety and he was changed to 1-1 precautions. Apparently goes to [facility] and has received long-acting injectable paliperidone his next shot is due in January he states that no medications have ever really been helpful and he is always been suicidal however he acknowledges chronic substance abuse and in particular cocaine dependence.  States he does not work lives at home with his mother.   Jaymin was admitted to the [facility] adult unit with complaints of multiple suicide attempts by overdose, increased substance use & auditory  hallucinations. He came to the hospital for evaluation & mood stabilization treatments. After the above admission evaluation, Arianna's presenting symptoms were noted. The medication regimen targeting those symptoms were discussed with patient & with his consent, initiated. He was medicated & discharged on; Amantadine 100 mg for EPS, Cogentin 0.5 mg for EPS, Wellbutrin XL for depression, Vraylar 3 mg for mood control, Fluoxetine 60 mg for depression, Nicorette gum 2 mg for nicotine withdrawal, Minipress 1 mg for nightmares & Trazodone 200 mg for insomnia. He presented no other pre-existing medical issues that required treatment. Sinclair tolerated his treatment regimen without any adverse effects or reactions reported.  Besides the mood stabilization treatments, Jamespaul was also enrolled & participated in the group counseling sessions being offered & held on this unit. He learned coping skills. Buck's symptoms responded well to his treatment regimen. This is evidenced by his reports of improved mood, absence of hallucinations or suicidal ideations. He is currently stable to be discharged to continue mental health care on an outpatient basis as noted below. He is provided with all the necessary information needed to make this appointment without problems.  Upon discharge, Landin adamantly denies any SIHI, AVH, delusional thoughts or paranoia. He left [facility] with all personal belongings in no apparent distress.  Physical Findings: AIMS: Facial and Oral Movements Muscles of Facial Expression: None, normal Lips and Perioral Area: None, normal Jaw: None, normal Tongue: None, normal,Extremity Movements Upper (arms, wrists, hands, fingers): None, normal Lower (legs, knees, ankles, toes): None, normal, Trunk Movements Neck, shoulders, hips: None, normal, Overall Severity Severity of abnormal movements (highest score from questions above): None, normal Incapacitation due to abnormal movements: None, normal Patient's  awareness of abnormal movements (rate only patient's report): No Awareness, Dental Status Current problems with teeth and/or dentures?: No Does patient usually wear dentures?: No  CIWA:    COWS:     Musculoskeletal: Strength & Muscle Tone: within normal limits Gait & Station: normal Patient leans: N/A  Psychiatric Specialty Exam: Physical Exam  Nursing note and vitals reviewed. Constitutional: He is oriented to person, place, and time. He appears well-developed.  HENT:  Head: Normocephalic.  Eyes: Pupils are equal, round, and reactive to light.  Neck: Normal range of motion.  Cardiovascular: Normal rate.  Respiratory: Effort normal.  GI: Soft.  Genitourinary:    Genitourinary Comments: Deferred   Musculoskeletal: Normal range of motion.  Neurological: He is alert and oriented to person, place, and time.  Skin: Skin is warm and dry.    Review of Systems  Constitutional: Negative.   HENT: Negative.   Eyes: Negative.   Respiratory: Negative.  Negative for cough and shortness of breath.   Cardiovascular: Negative.  Negative for chest pain and palpitations.  Gastrointestinal: Negative.  Negative for abdominal pain, heartburn, nausea and vomiting.  Genitourinary: Negative.   Skin:  Negative.   Neurological: Negative for dizziness and headaches.  Endo/Heme/Allergies: Negative.   Psychiatric/Behavioral: Positive for depression (Stabilized with medication prior to discharge), hallucinations (Hx. psychosis (stabilized with medication prior to discharge)) and substance abuse. Negative for memory loss and suicidal ideas (Hx. Cocaine use disorder). The patient has insomnia (Stabilized with medication prior to discharge). The patient is not nervous/anxious (Stable).     Blood pressure 117/69, pulse 84, temperature (!) 97.5 F (36.4 C), temperature source Oral, resp. rate 16, height 5\' 8"  (1.727 m), weight 77.6 kg.Body mass index is 26 kg/m.  See Md's discharge SRA   Have you used any  form of tobacco in the last 30 days? (Cigarettes, Smokeless Tobacco, Cigars, and/or Pipes): Yes  Has this patient used any form of tobacco in the last 30 days? (Cigarettes, Smokeless Tobacco, Cigars, and/or Pipes): Yes, an FDA-approved tobacco cessation medication was offered at discharge.  Blood Alcohol level:  Lab Results  Component Value Date   ETH <10 03/09/2018   ETH <10 36/14/4315   Metabolic Disorder Labs:  No results found for: HGBA1C, MPG No results found for: PROLACTIN No results found for: CHOL, TRIG, HDL, CHOLHDL, VLDL, LDLCALC  See Psychiatric Specialty Exam and Suicide Risk Assessment completed by Attending Physician prior to discharge.  Discharge destination:  Home  Is patient on multiple antipsychotic therapies at discharge:  No   Has Patient had three or more failed trials of antipsychotic monotherapy by history:  No  Recommended Plan for Multiple Antipsychotic Therapies: NA  Allergies as of 03/18/2018   No Known Allergies     Medication List    STOP taking these medications   INVEGA TRINZA 546 MG/1.75ML Susy Generic drug:  Paliperidone Palmitate ER     TAKE these medications     Indication  amantadine 100 MG capsule Commonly known as:  SYMMETREL Take 1 capsule (100 mg total) by mouth 2 (two) times daily. For prevention of drug induced tremors  Indication:  Extrapyramidal Reaction caused by Medications   benztropine 0.5 MG tablet Commonly known as:  COGENTIN Take 1 tablet (0.5 mg total) by mouth at bedtime. For prevention of drug induced tremors What changed:  additional instructions  Indication:  Extrapyramidal Reaction caused by Medications   buPROPion 150 MG 24 hr tablet Commonly known as:  WELLBUTRIN XL Take 1 tablet (150 mg total) by mouth daily. For depression Start taking on:  March 19, 2018  Indication:  Major Depressive Disorder   cariprazine capsule Commonly known as:  VRAYLAR Take 1 capsule (3 mg total) by mouth daily. For mood  control Start taking on:  March 19, 2018  Indication:  Mood control   FLUoxetine 20 MG capsule Commonly known as:  PROZAC Take 3 capsules (60 mg total) by mouth daily. For depression Start taking on:  March 19, 2018 What changed:    medication strength  how much to take  additional instructions  Indication:  Major Depressive Disorder   ibuprofen 600 MG tablet Commonly known as:  ADVIL,MOTRIN Take 1 tablet (600 mg total) by mouth every 6 (six) hours as needed for mild pain or moderate pain (use acetaminophen 1st). (May buy from over the counter): For pain  Indication:  Mild - moderate pain   nicotine polacrilex 2 MG gum Commonly known as:  NICORETTE Take 1 each (2 mg total) by mouth as needed for smoking cessation. (May buy from over the counter): For smoking cessation  Indication:  Nicotine Addiction   prazosin 1 MG capsule Commonly  known as:  MINIPRESS Take 1 capsule (1 mg total) by mouth at bedtime. For PTSD related nightmares What changed:    when to take this  additional instructions  Indication:  Frightening Dreams, PTSD   traZODone 100 MG tablet Commonly known as:  DESYREL Take 2 tablets (200 mg total) by mouth at bedtime. For sleep What changed:    medication strength  how much to take  additional instructions  Indication:  [place]. Go on 03/21/2018.   Specialty:  Behavioral Health Why:  Please attend your assessment on Monday, 03/21/18 at 11:00a. Please bring: photo ID, proof of insurance, and any discharge paperwork from this hospitalization. Contact information: [address] [place] [address] [phone]          Follow-up recommendations:  Activity:  As tolerated Diet: As recommended by your primary care doctor. Keep all scheduled follow-up appointments as recommended.   Comments: Patient is instructed prior to discharge to: Take all medications as prescribed  by his/her mental healthcare provider. Report any adverse effects and or reactions from the medicines to his/her outpatient provider promptly. Patient has been instructed & cautioned: To not engage in alcohol and or illegal drug use while on prescription medicines. In the event of worsening symptoms, patient is instructed to call the crisis hotline, 911 and or go to the nearest ED for appropriate evaluation and treatment of symptoms. To follow-up with his/her primary care provider for your other medical issues, concerns and or health care needs.  Signed: Lindell Spar, NP, pmhnp, fnp-Bc 03/18/2018, 9:46 AM

## 2018-03-18 NOTE — Progress Notes (Signed)
  [facility] Adult Case Management Discharge Plan :  Will you be returning to the same living situation after discharge:  Yes,  home with family At discharge, do you have transportation home?: Yes,  sister in law or mother Do you have the ability to pay for your medications: Yes,  managed medicare  Release of information consent forms completed and submitted to medical records by CSW.  Patient to Follow up at: [place]. Go on 03/21/2018.   Specialty:  Behavioral Health Why:  Please attend your assessment on Monday, 03/21/18 at 11:00a. Please bring: photo ID, proof of insurance, and any discharge paperwork from this hospitalization. Contact information: [place] [address] [phone]           Next level of care provider has access to [place] and Suicide Prevention discussed: Yes,  SPE completed with pt and his brother/guardian. SPI pamphlet and mobile crisis information also provided to pt.   Have you used any form of tobacco in the last 30 days? (Cigarettes, Smokeless Tobacco, Cigars, and/or Pipes): Yes  Has patient been referred to the Quitline?: Patient refused referral  Patient has been referred for addiction treatment: Yes  Avelina Laine, LCSW 03/18/2018, 8:39 AM

## 2018-03-18 NOTE — BHH Suicide Risk Assessment (Signed)
[facility] Discharge Suicide Risk Assessment   Principal Problem: Cocaine dependence and substance abuse/depression Discharge Diagnoses: Active Problems:   Schizophrenia ([place])   Schizoaffective disorder, depressive type ([place])   Total Time spent with patient: 45 minutes    Mental Status Per Nursing Assessment::   On Admission:  Suicidal ideation indicated by patient, Self-harm thoughts  Demographic Factors:  Male  Loss Factors: Decrease in vocational status  Historical Factors: Impulsivity  Risk Reduction Factors:   Positive social support  Continued Clinical Symptoms:  More than one psychiatric diagnosis  Cognitive Features That Contribute To Risk:  None    Suicide Risk:  Mild:  Suicidal ideation of limited frequency, intensity, duration, and specificity.  There are no identifiable plans, no associated intent, mild dysphoria and related symptoms, good self-control (both objective and subjective assessment), few other risk factors, and identifiable protective factors, including available and accessible social support.  [place]. Go on 03/21/2018.   Specialty:  Behavioral Health Why:  Please attend your assessment on Monday, 03/21/18 at 11:00a. Please bring: photo ID, proof of insurance, and any discharge paperwork from this hospitalization. Contact information: [address] [place] [address] [phone]           Plan Of Care/Follow-up recommendations:  Activity:  full  Legacie Dillingham, MD 03/18/2018, 8:03 AM

## 2018-03-18 NOTE — Progress Notes (Signed)
Patient ID: [name], male   DOB: [birth year], 37 y.o.   MRN: [id number]  Pt discharged to lobby. Pt was stable and appreciative at that time. All papers and prescriptions were given and valuables returned. Verbal understanding expressed. Denies SI/HI and A/VH. Pt given opportunity to express concerns and ask questions.

## 2018-04-22 ENCOUNTER — Telehealth (HOSPITAL_COMMUNITY): Payer: Self-pay | Admitting: Psychiatry

## 2018-12-27 ENCOUNTER — Emergency Department (HOSPITAL_COMMUNITY): Payer: Medicare Other

## 2018-12-27 ENCOUNTER — Encounter (HOSPITAL_COMMUNITY): Payer: Self-pay

## 2018-12-27 ENCOUNTER — Emergency Department (HOSPITAL_COMMUNITY)
Admission: EM | Admit: 2018-12-27 | Discharge: 2018-12-27 | Disposition: A | Discharge status: Home / Self Care | Payer: Medicare Other | Attending: Emergency Medicine | Admitting: Emergency Medicine

## 2018-12-27 DIAGNOSIS — S93401A Sprain of unspecified ligament of right ankle, initial encounter: Secondary | ICD-10-CM | POA: Insufficient documentation

## 2018-12-27 DIAGNOSIS — W109XXA Fall (on) (from) unspecified stairs and steps, initial encounter: Secondary | ICD-10-CM | POA: Insufficient documentation

## 2018-12-27 DIAGNOSIS — Z79899 Other long term (current) drug therapy: Secondary | ICD-10-CM | POA: Diagnosis not present

## 2018-12-27 DIAGNOSIS — F17290 Nicotine dependence, other tobacco product, uncomplicated: Secondary | ICD-10-CM | POA: Diagnosis not present

## 2018-12-27 DIAGNOSIS — Y999 Unspecified external cause status: Secondary | ICD-10-CM | POA: Diagnosis not present

## 2018-12-27 DIAGNOSIS — Y929 Unspecified place or not applicable: Secondary | ICD-10-CM | POA: Diagnosis not present

## 2018-12-27 DIAGNOSIS — S99911A Unspecified injury of right ankle, initial encounter: Secondary | ICD-10-CM | POA: Diagnosis present

## 2018-12-27 DIAGNOSIS — Y9301 Activity, walking, marching and hiking: Secondary | ICD-10-CM | POA: Diagnosis not present

## 2018-12-27 DIAGNOSIS — S92354A Nondisplaced fracture of fifth metatarsal bone, right foot, initial encounter for closed fracture: Secondary | ICD-10-CM | POA: Diagnosis not present

## 2018-12-27 MED ORDER — IBUPROFEN 200 MG PO TABS
600.0000 mg | ORAL_TABLET | Freq: Once | ORAL | Status: AC
  Administered 2018-12-27: 600 mg via ORAL
  Filled 2018-12-27: qty 3

## 2018-12-27 NOTE — ED Triage Notes (Signed)
Pt arrived via EMS from home c/o rt ankle pain with associated swelling  ice applied. Pt had a mechanical fall today at home on the stairs. Denies any injury to head, or LOC.    114/82, HR 100, 985 RA, CBG 94 Temp 98.6

## 2018-12-27 NOTE — ED Provider Notes (Signed)
[facility] DEPT Provider Note   CSN: [id number] Arrival date & time: 12/27/18  1300     History   Chief Complaint Chief Complaint  Patient presents with  . Fall  . Ankle Pain    HPI [name] is a 38 y.o. male with past medical history of schizophrenia, overdose, seizure disorder, tachycardia, presenting to the emergency department via EMS from home after mechanical fall.  States he was rushing down the stairs and twisted his right ankle.  He did not  hit his head.  He has pain and swelling to the lateral aspect of the right ankle and foot that is worse with movement and walking.  No medications taken prior to arrival for symptoms.  He is previous R tibia fracture status post ORIF.  No wounds.     The history is provided by the patient.    Past Medical History:  Diagnosis Date  . Anxiety   . Headache     Patient Active Problem List   Diagnosis Date Noted  . Schizoaffective disorder, depressive type ([place])   . Schizophrenia ([place]) 03/09/2018  . Schizophrenia, paranoid type ([place]) 10/11/2017  . Overdose 03/05/2015  . Seizures ([place])   . Tachycardia   . Right shoulder pain     Past Surgical History:  Procedure Laterality Date  . APPENDECTOMY    . TIBIA FRACTURE SURGERY     right        Home Medications    Prior to Admission medications   Medication Sig Start Date End Date Taking? Authorizing Provider  amantadine (SYMMETREL) 100 MG capsule Take 1 capsule (100 mg total) by mouth 2 (two) times daily. For prevention of drug induced tremors 03/18/18   Lindell Spar I, NP  benztropine (COGENTIN) 0.5 MG tablet Take 1 tablet (0.5 mg total) by mouth at bedtime. For prevention of drug induced tremors 03/18/18   Lindell Spar I, NP  buPROPion (WELLBUTRIN XL) 150 MG 24 hr tablet Take 1 tablet (150 mg total) by mouth daily. For depression 03/19/18   Lindell Spar I, NP  cariprazine (VRAYLAR) capsule Take 1 capsule (3 mg total) by mouth daily. For  mood control 03/19/18   Lindell Spar I, NP  FLUoxetine (PROZAC) 20 MG capsule Take 3 capsules (60 mg total) by mouth daily. For depression 03/19/18   Lindell Spar I, NP  ibuprofen (ADVIL,MOTRIN) 600 MG tablet Take 1 tablet (600 mg total) by mouth every 6 (six) hours as needed for mild pain or moderate pain (use acetaminophen 1st). (May buy from over the counter): For pain 03/18/18   Lindell Spar I, NP  nicotine polacrilex (NICORETTE) 2 MG gum Take 1 each (2 mg total) by mouth as needed for smoking cessation. (May buy from over the counter): For smoking cessation 03/18/18   Lindell Spar I, NP  prazosin (MINIPRESS) 1 MG capsule Take 1 capsule (1 mg total) by mouth at bedtime. For PTSD related nightmares 03/18/18   Lindell Spar I, NP  traZODone (DESYREL) 100 MG tablet Take 2 tablets (200 mg total) by mouth at bedtime. For sleep 03/18/18   Encarnacion Slates, NP    Family History History reviewed. No pertinent family history.  Social History Social History   Tobacco Use  . Smoking status: Current Some Day Smoker    Types: Cigars  . Smokeless tobacco: Never Used  Substance Use Topics  . Alcohol use: Yes    Comment: weekly  . Drug use: Yes    Types:  Marijuana, "Crack" cocaine    Comment: hx 2015 cocaine     Allergies   Patient has no known allergies.   Review of Systems Review of Systems  Musculoskeletal: Positive for arthralgias and joint swelling.  Skin: Negative for wound.     Physical Exam Updated Vital Signs BP 130/82   Pulse (!) 109   Temp 98.6 F (37 C) (Oral)   Resp 18   Ht 5\' 9"  (1.753 m)   Wt 81.6 kg   SpO2 99%   BMI 26.58 kg/m   Physical Exam Vitals signs and nursing note reviewed.  Constitutional:      General: He is not in acute distress.    Appearance: He is well-developed.  HENT:     Head: Normocephalic and atraumatic.  Eyes:     Conjunctiva/sclera: Conjunctivae normal.  Cardiovascular:     Rate and Rhythm: Normal rate.  Pulmonary:     Effort:  Pulmonary effort is normal.  Musculoskeletal:     Comments: Right lateral malleolus with swelling and tenderness just anteriorly.  There is also some swelling over the base of the right lateral metatarsals with associated tenderness.  No obvious deformity.  No wounds.  Neurovascularly intact.  Moving toes without difficulty.  Neurological:     Mental Status: He is alert.  Psychiatric:        Mood and Affect: Mood normal.        Behavior: Behavior normal.      ED Treatments / Results  Labs (all labs ordered are listed, but only abnormal results are displayed) Labs Reviewed - No data to display  EKG None  Radiology Dg Ankle Complete Right  Result Date: 12/27/2018 CLINICAL DATA:  Fall today on stairs at home with right lower leg and ankle pain/swelling. EXAM: RIGHT ANKLE - COMPLETE 3+ VIEW COMPARISON:  Right tibia/fibula series 07/03/2010 FINDINGS: Tibial intramedullary nail with associated screws intact. Ankle mortise is normal. Soft tissue swelling is present over the lateral ankle. There is a transverse fracture without significant displacement over the base of the fifth metatarsal. Remainder the exam is unremarkable. IMPRESSION: Base of fifth metatarsal fracture without significant displacement. Electronically Signed   By: Marin Olp M.D.   On: 12/27/2018 14:23    Procedures Procedures (including critical care time)  Medications Ordered in ED Medications  ibuprofen (ADVIL) tablet 600 mg (has no administration in time range)     Initial Impression / Assessment and Plan / ED Course  I have reviewed the triage vital signs and the nursing notes.  Pertinent labs & imaging results that were available during my care of the patient were reviewed by me and considered in my medical decision making (see chart for details).  Clinical Course as of Dec 27 1518  Tue Dec 27, 2018  1514 Phone call placed to patient's brother and legal guardian, [name].  He is made aware of  patient's visit to the ER for injury.  Discussed diagnosis and plan for discharge.  He requests no strong pain medications.  Patient provided ibuprofen.   [JR]    Clinical Course User Index [JR] , [place] N, PA-C       Patient with trip and fall down a few steps, twisting his right ankle.  He has pain and swelling to the lateral aspect of the ankle and foot, no wounds, neurovascularly intact.  X-ray with nondisplaced base of the fifth metatarsal fracture.  Suspect sprain of ankle given swelling and tenderness.  Will place in cam  walker boot for support and provide crutches for nonweightbearing.  Orthopedic referral provided for follow-up.  Discussed with patient's legal guardian/brother who is aware of patient's diagnosis and care plan.  Safe for discharge.  Discussed results, findings, treatment and follow up. Patient advised of return precautions. Patient verbalized understanding and agreed with plan.   Final Clinical Impressions(s) / ED Diagnoses   Final diagnoses:  Sprain of right ankle, unspecified ligament, initial encounter  Closed nondisplaced fracture of fifth metatarsal bone of right foot, initial encounter    ED Discharge Orders    None       , [place] N, PA-C 12/27/18 1520    Gareth Morgan, MD 12/29/18 1116

## 2018-12-27 NOTE — Discharge Instructions (Signed)
Please read instructions below. Apply ice to your foot and ankle for 20 minutes at a time.  Keep it elevated as much as possible to help with swelling. Do not bear any weight on your right foot until you have been evaluated by an orthopedic specialist. You can take 600 mg of ibuprofen every 6 hours as needed for pain. Schedule an appointment with the orthopedic specialist in 1 to 2 weeks for repeat x-ray and follow-up on your injury. Return to the ER for new or concerning symptoms.

## 2018-12-30 ENCOUNTER — Ambulatory Visit (INDEPENDENT_AMBULATORY_CARE_PROVIDER_SITE_OTHER): Payer: Medicare Other | Admitting: Orthopaedic Surgery

## 2018-12-30 ENCOUNTER — Ambulatory Visit: Payer: Self-pay

## 2018-12-30 ENCOUNTER — Other Ambulatory Visit: Payer: Self-pay

## 2018-12-30 ENCOUNTER — Encounter (HOSPITAL_BASED_OUTPATIENT_CLINIC_OR_DEPARTMENT_OTHER): Payer: Self-pay | Admitting: *Deleted

## 2018-12-30 ENCOUNTER — Encounter: Payer: Self-pay | Admitting: Orthopaedic Surgery

## 2018-12-30 DIAGNOSIS — S99191A Other physeal fracture of right metatarsal, initial encounter for closed fracture: Secondary | ICD-10-CM | POA: Diagnosis not present

## 2018-12-30 NOTE — Progress Notes (Signed)
Office Visit Note   Patient: [name]           Date of Birth: 05-20-1980           MRN: [id number] Visit Date: 12/30/2018              Requested by: No referring provider defined for this encounter. PCP: Patient, No Pcp Per   Assessment & Plan: Visit Diagnoses:  1. Closed fracture of base of fifth metatarsal bone of right foot at metaphyseal-diaphyseal junction, initial encounter     Plan: Impression is nondisplaced proximal metadiaphyseal fracture of the right fifth metatarsal consistent with a Jones fracture.  Patient is young and active and with his current smoking the risk of nonunion or delayed union is not insignificant therefore I have recommended surgical fixation with intramedullary screw as a means of more reliable predictable healing and quicker rehab and recovery and return to activity.  Risks and benefits and possible complications were discussed. Total face to face encounter time was greater than 45 minutes and over half of this time was spent in counseling and/or coordination of care.  Follow-Up Instructions: Return for 2 week postop visit.   Orders:  Orders Placed This Encounter  Procedures  . XR Foot Complete Right   No orders of the defined types were placed in this encounter.     Procedures: No procedures performed   Clinical Data: No additional findings.   Subjective: Chief Complaint  Patient presents with  . Right Ankle - Pain    [name] is a 38 year old gentleman who comes in for evaluation of acute right fifth metatarsal fracture which she suffered from falling down the stairs on Tuesday.  He went to the ED and x-rays were normal of the ankle but it showed a nondisplaced fifth metatarsal fracture.  He endorses significant pain on the lateral aspect of his right foot.  He is active and works as a Scientist, water quality.  He does smoke.   Review of Systems  Constitutional: Negative.   All other systems reviewed and are negative.    Objective: Vital  Signs: There were no vitals taken for this visit.  Physical Exam Vitals signs and nursing note reviewed.  Constitutional:      Appearance: He is well-developed.  HENT:     Head: Normocephalic and atraumatic.  Eyes:     Pupils: Pupils are equal, round, and reactive to light.  Neck:     Musculoskeletal: Neck supple.  Pulmonary:     Effort: Pulmonary effort is normal.  Abdominal:     Palpations: Abdomen is soft.  Musculoskeletal: Normal range of motion.  Skin:    General: Skin is warm.  Neurological:     Mental Status: He is alert and oriented to person, place, and time.  Psychiatric:        Behavior: Behavior normal.        Thought Content: Thought content normal.        Judgment: Judgment normal.     Ortho Exam Right foot exam shows mild swelling.  No neurovascular compromise.  Tenderness palpation of the base of fifth metatarsal. Specialty Comments:  No specialty comments available.  Imaging: Xr Foot Complete Right  Result Date: 12/30/2018 Nondisplaced Jones fracture of the fifth metatarsal.    PMFS History: Patient Active Problem List   Diagnosis Date Noted  . Closed fracture of base of fifth metatarsal bone of right foot at metaphyseal-diaphyseal junction 12/30/2018  . Schizoaffective disorder, depressive type ([place])   .  Schizophrenia ([place]) 03/09/2018  . Schizophrenia, paranoid type ([place]) 10/11/2017  . Overdose 03/05/2015  . Seizures ([place])   . Tachycardia   . Right shoulder pain    Past Medical History:  Diagnosis Date  . Anxiety   . Headache     History reviewed. No pertinent family history.  Past Surgical History:  Procedure Laterality Date  . APPENDECTOMY    . TIBIA FRACTURE SURGERY     right   Social History   Occupational History  . Not on file  Tobacco Use  . Smoking status: Current Some Day Smoker    Types: Cigars  . Smokeless tobacco: Never Used  Substance and Sexual Activity  . Alcohol use: Yes    Comment: weekly  . Drug use: Yes     Types: Marijuana, "Crack" cocaine    Comment: hx 2015 cocaine  . Sexual activity: Yes

## 2018-12-31 ENCOUNTER — Other Ambulatory Visit (HOSPITAL_COMMUNITY)
Admission: RE | Admit: 2018-12-31 | Discharge: 2018-12-31 | Disposition: A | Discharge status: Home / Self Care | Payer: Medicare Other | Source: Ambulatory Visit | Attending: Orthopaedic Surgery | Admitting: Orthopaedic Surgery

## 2018-12-31 DIAGNOSIS — Z01812 Encounter for preprocedural laboratory examination: Secondary | ICD-10-CM | POA: Diagnosis present

## 2018-12-31 DIAGNOSIS — Z20828 Contact with and (suspected) exposure to other viral communicable diseases: Secondary | ICD-10-CM | POA: Insufficient documentation

## 2019-01-01 LAB — NOVEL CORONAVIRUS, NAA (HOSP ORDER, SEND-OUT TO REF LAB; TAT 18-24 HRS): SARS-CoV-2, NAA: NOT DETECTED

## 2019-01-04 ENCOUNTER — Ambulatory Visit (HOSPITAL_BASED_OUTPATIENT_CLINIC_OR_DEPARTMENT_OTHER): Payer: Medicare Other | Admitting: Anesthesiology

## 2019-01-04 ENCOUNTER — Ambulatory Visit (HOSPITAL_BASED_OUTPATIENT_CLINIC_OR_DEPARTMENT_OTHER)
Admission: RE | Admit: 2019-01-04 | Discharge: 2019-01-04 | Disposition: A | Discharge status: Home / Self Care | Payer: Medicare Other | Attending: Orthopaedic Surgery | Admitting: Orthopaedic Surgery

## 2019-01-04 ENCOUNTER — Encounter (HOSPITAL_BASED_OUTPATIENT_CLINIC_OR_DEPARTMENT_OTHER): Payer: Self-pay | Admitting: *Deleted

## 2019-01-04 ENCOUNTER — Other Ambulatory Visit: Payer: Self-pay

## 2019-01-04 ENCOUNTER — Encounter (HOSPITAL_BASED_OUTPATIENT_CLINIC_OR_DEPARTMENT_OTHER)
Admission: RE | Disposition: A | Discharge status: Home / Self Care | Payer: Self-pay | Source: Home / Self Care | Attending: Orthopaedic Surgery

## 2019-01-04 DIAGNOSIS — F17210 Nicotine dependence, cigarettes, uncomplicated: Secondary | ICD-10-CM | POA: Diagnosis not present

## 2019-01-04 DIAGNOSIS — X58XXXA Exposure to other specified factors, initial encounter: Secondary | ICD-10-CM | POA: Insufficient documentation

## 2019-01-04 DIAGNOSIS — F209 Schizophrenia, unspecified: Secondary | ICD-10-CM | POA: Diagnosis not present

## 2019-01-04 DIAGNOSIS — F1290 Cannabis use, unspecified, uncomplicated: Secondary | ICD-10-CM | POA: Diagnosis not present

## 2019-01-04 DIAGNOSIS — S99191A Other physeal fracture of right metatarsal, initial encounter for closed fracture: Secondary | ICD-10-CM | POA: Diagnosis present

## 2019-01-04 DIAGNOSIS — S92351A Displaced fracture of fifth metatarsal bone, right foot, initial encounter for closed fracture: Secondary | ICD-10-CM | POA: Diagnosis not present

## 2019-01-04 HISTORY — PX: ORIF TOE FRACTURE: SHX5032

## 2019-01-04 HISTORY — DX: Schizophrenia, unspecified: F20.9

## 2019-01-04 HISTORY — DX: Personal history of other mental and behavioral disorders: Z86.59

## 2019-01-04 SURGERY — OPEN REDUCTION INTERNAL FIXATION (ORIF) METATARSAL (TOE) FRACTURE
Anesthesia: General | Site: Toe | Laterality: Right

## 2019-01-04 MED ORDER — PROPOFOL 10 MG/ML IV BOLUS
INTRAVENOUS | Status: DC | PRN
  Administered 2019-01-04: 50 mg via INTRAVENOUS
  Administered 2019-01-04: 150 mg via INTRAVENOUS

## 2019-01-04 MED ORDER — DEXAMETHASONE SODIUM PHOSPHATE 10 MG/ML IJ SOLN
INTRAMUSCULAR | Status: AC
  Filled 2019-01-04: qty 1

## 2019-01-04 MED ORDER — TRAMADOL HCL 50 MG PO TABS: 50.0000 mg | ORAL_TABLET | Freq: Three times a day (TID) | ORAL | 2 refills | Status: DC | PRN

## 2019-01-04 MED ORDER — LIDOCAINE 2% (20 MG/ML) 5 ML SYRINGE
INTRAMUSCULAR | Status: DC | PRN
  Administered 2019-01-04: 100 mg via INTRAVENOUS

## 2019-01-04 MED ORDER — FENTANYL CITRATE (PF) 100 MCG/2ML IJ SOLN
INTRAMUSCULAR | Status: AC
  Filled 2019-01-04: qty 2

## 2019-01-04 MED ORDER — ASPIRIN EC 81 MG PO TBEC: 81.0000 mg | DELAYED_RELEASE_TABLET | Freq: Two times a day (BID) | ORAL | 0 refills | Status: DC

## 2019-01-04 MED ORDER — MIDAZOLAM HCL 2 MG/2ML IJ SOLN
INTRAMUSCULAR | Status: AC
  Filled 2019-01-04: qty 2

## 2019-01-04 MED ORDER — FENTANYL CITRATE (PF) 100 MCG/2ML IJ SOLN
50.0000 ug | INTRAMUSCULAR | Status: DC | PRN
  Administered 2019-01-04: 100 ug via INTRAVENOUS

## 2019-01-04 MED ORDER — OXYCODONE HCL 5 MG PO TABS: 5.0000 mg | ORAL_TABLET | Freq: Once | ORAL | Status: DC | PRN

## 2019-01-04 MED ORDER — CEFAZOLIN SODIUM-DEXTROSE 2-4 GM/100ML-% IV SOLN
2.0000 g | INTRAVENOUS | Status: AC
  Administered 2019-01-04: 2 g via INTRAVENOUS

## 2019-01-04 MED ORDER — ONDANSETRON HCL 4 MG/2ML IJ SOLN
INTRAMUSCULAR | Status: AC
  Filled 2019-01-04: qty 2

## 2019-01-04 MED ORDER — LACTATED RINGERS IV SOLN
INTRAVENOUS | Status: DC | PRN
  Administered 2019-01-04: 09:00:00 via INTRAVENOUS

## 2019-01-04 MED ORDER — ACETAMINOPHEN 325 MG PO TABS: 325.0000 mg | ORAL_TABLET | ORAL | Status: DC | PRN

## 2019-01-04 MED ORDER — MIDAZOLAM HCL 2 MG/2ML IJ SOLN
1.0000 mg | INTRAMUSCULAR | Status: DC | PRN
  Administered 2019-01-04: 2 mg via INTRAVENOUS

## 2019-01-04 MED ORDER — FENTANYL CITRATE (PF) 100 MCG/2ML IJ SOLN: 25.0000 ug | INTRAMUSCULAR | Status: DC | PRN

## 2019-01-04 MED ORDER — DEXAMETHASONE SODIUM PHOSPHATE 10 MG/ML IJ SOLN: 8.0000 mg | Freq: Once | INTRAMUSCULAR | Status: DC | PRN

## 2019-01-04 MED ORDER — ACETAMINOPHEN 160 MG/5ML PO SOLN: 325.0000 mg | ORAL | Status: DC | PRN

## 2019-01-04 MED ORDER — LIDOCAINE 2% (20 MG/ML) 5 ML SYRINGE
INTRAMUSCULAR | Status: AC
  Filled 2019-01-04: qty 5

## 2019-01-04 MED ORDER — LACTATED RINGERS IV SOLN
INTRAVENOUS | Status: DC
  Administered 2019-01-04: 08:00:00 via INTRAVENOUS

## 2019-01-04 MED ORDER — ONDANSETRON HCL 4 MG/2ML IJ SOLN
INTRAMUSCULAR | Status: DC | PRN
  Administered 2019-01-04: 4 mg via INTRAVENOUS

## 2019-01-04 MED ORDER — OXYCODONE HCL 5 MG/5ML PO SOLN: 5.0000 mg | Freq: Once | ORAL | Status: DC | PRN

## 2019-01-04 MED ORDER — PROPOFOL 10 MG/ML IV BOLUS
INTRAVENOUS | Status: AC
  Filled 2019-01-04: qty 20

## 2019-01-04 MED ORDER — KETOROLAC TROMETHAMINE 30 MG/ML IJ SOLN: 30.0000 mg | Freq: Once | INTRAMUSCULAR | Status: DC | PRN

## 2019-01-04 MED ORDER — MEPERIDINE HCL 25 MG/ML IJ SOLN: 6.2500 mg | INTRAMUSCULAR | Status: DC | PRN

## 2019-01-04 MED ORDER — CEFAZOLIN SODIUM-DEXTROSE 2-4 GM/100ML-% IV SOLN
INTRAVENOUS | Status: AC
  Filled 2019-01-04: qty 100

## 2019-01-04 MED ORDER — ROPIVACAINE HCL 7.5 MG/ML IJ SOLN
INTRAMUSCULAR | Status: DC | PRN
  Administered 2019-01-04 (×4): 5 mL via PERINEURAL

## 2019-01-04 MED ORDER — ONDANSETRON HCL 4 MG PO TABS: 4.0000 mg | ORAL_TABLET | Freq: Three times a day (TID) | ORAL | 0 refills | Status: DC | PRN

## 2019-01-04 MED ORDER — DEXAMETHASONE SODIUM PHOSPHATE 10 MG/ML IJ SOLN
INTRAMUSCULAR | Status: DC | PRN
  Administered 2019-01-04: 4 mg via INTRAVENOUS

## 2019-01-04 SURGICAL SUPPLY — 80 items
BANDAGE ESMARK 6X9 LF (GAUZE/BANDAGES/DRESSINGS) ×1 IMPLANT
BIT DRILL CAN 3.5MM (BIT) ×1
BIT DRILL CANN 3.5 (BIT) ×2
BIT DRILL SRG 3.5XCAN FFTH MTR (BIT) IMPLANT
BIT DRL SRG 3.5XCANN FIFTH MTR (BIT) ×1
BLADE HEX COATED 2.75 (ELECTRODE) ×3 IMPLANT
BLADE SURG 15 STRL LF DISP TIS (BLADE) ×2 IMPLANT
BLADE SURG 15 STRL SS (BLADE) ×6
BNDG CMPR 9X6 STRL LF SNTH (GAUZE/BANDAGES/DRESSINGS) ×1
BNDG COHESIVE 4X5 TAN STRL (GAUZE/BANDAGES/DRESSINGS) ×3 IMPLANT
BNDG ELASTIC 4X5.8 VLCR STR LF (GAUZE/BANDAGES/DRESSINGS) IMPLANT
BNDG ESMARK 6X9 LF (GAUZE/BANDAGES/DRESSINGS) ×3
CANISTER SUCT 1200ML W/VALVE (MISCELLANEOUS) ×3 IMPLANT
COVER BACK TABLE REUSABLE LG (DRAPES) ×3 IMPLANT
COVER WAND RF STERILE (DRAPES) IMPLANT
CUFF TOURN SGL QUICK 34 (TOURNIQUET CUFF)
CUFF TRNQT CYL 34X4.125X (TOURNIQUET CUFF) IMPLANT
DECANTER SPIKE VIAL GLASS SM (MISCELLANEOUS) IMPLANT
DRAPE C-ARM 42X72 X-RAY (DRAPES) ×3 IMPLANT
DRAPE C-ARMOR (DRAPES) ×3 IMPLANT
DRAPE EXTREMITY T 121X128X90 (DISPOSABLE) ×3 IMPLANT
DRAPE HALF SHEET 70X43 (DRAPES) ×3 IMPLANT
DRAPE IMP U-DRAPE 54X76 (DRAPES) ×3 IMPLANT
DRAPE OEC MINIVIEW 54X84 (DRAPES) ×3 IMPLANT
DRAPE SURG 17X23 STRL (DRAPES) ×6 IMPLANT
DRAPE U-SHAPE 47X51 STRL (DRAPES) ×3 IMPLANT
DRSG PAD ABDOMINAL 8X10 ST (GAUZE/BANDAGES/DRESSINGS) ×6 IMPLANT
DURAPREP 26ML APPLICATOR (WOUND CARE) ×3 IMPLANT
ELECT REM PT RETURN 9FT ADLT (ELECTROSURGICAL) ×3
ELECTRODE REM PT RTRN 9FT ADLT (ELECTROSURGICAL) ×1 IMPLANT
GAUZE SPONGE 4X4 12PLY STRL (GAUZE/BANDAGES/DRESSINGS) ×3 IMPLANT
GAUZE XEROFORM 1X8 LF (GAUZE/BANDAGES/DRESSINGS) ×3 IMPLANT
GLOVE BIO SURGEON STRL SZ 6.5 (GLOVE) ×1 IMPLANT
GLOVE BIO SURGEONS STRL SZ 6.5 (GLOVE) ×1
GLOVE BIOGEL PI IND STRL 7.0 (GLOVE) ×1 IMPLANT
GLOVE BIOGEL PI INDICATOR 7.0 (GLOVE) ×6
GLOVE ECLIPSE 7.0 STRL STRAW (GLOVE) ×3 IMPLANT
GLOVE SKINSENSE NS SZ7.5 (GLOVE) ×2
GLOVE SKINSENSE STRL SZ7.5 (GLOVE) ×1 IMPLANT
GLOVE SURG SYN 7.5  E (GLOVE) ×2
GLOVE SURG SYN 7.5 E (GLOVE) ×1 IMPLANT
GLOVE SURG SYN 7.5 PF PI (GLOVE) ×1 IMPLANT
GOWN STRL REIN XL XLG (GOWN DISPOSABLE) ×3 IMPLANT
GOWN STRL REUS W/ TWL LRG LVL3 (GOWN DISPOSABLE) ×1 IMPLANT
GOWN STRL REUS W/ TWL XL LVL3 (GOWN DISPOSABLE) ×1 IMPLANT
GOWN STRL REUS W/TWL LRG LVL3 (GOWN DISPOSABLE) ×3
GOWN STRL REUS W/TWL XL LVL3 (GOWN DISPOSABLE) ×3
GUIDEWIRE .07X8 (WIRE) ×2 IMPLANT
NEEDLE HYPO 22GX1.5 SAFETY (NEEDLE) IMPLANT
NS IRRIG 1000ML POUR BTL (IV SOLUTION) ×3 IMPLANT
PACK BASIN DAY SURGERY FS (CUSTOM PROCEDURE TRAY) ×3 IMPLANT
PAD CAST 3X4 CTTN HI CHSV (CAST SUPPLIES) IMPLANT
PAD CAST 4YDX4 CTTN HI CHSV (CAST SUPPLIES) IMPLANT
PADDING CAST COTTON 3X4 STRL (CAST SUPPLIES)
PADDING CAST COTTON 4X4 STRL (CAST SUPPLIES)
PADDING CAST SYN 6 (CAST SUPPLIES)
PADDING CAST SYNTHETIC 4 (CAST SUPPLIES)
PADDING CAST SYNTHETIC 4X4 STR (CAST SUPPLIES) IMPLANT
PADDING CAST SYNTHETIC 6X4 NS (CAST SUPPLIES) IMPLANT
PENCIL BUTTON HOLSTER BLD 10FT (ELECTRODE) ×3 IMPLANT
SCREW 5.5MMX45MM (Screw) ×2 IMPLANT
SLEEVE SCD COMPRESS KNEE MED (MISCELLANEOUS) ×3 IMPLANT
SPLINT FIBERGLASS 3X35 (CAST SUPPLIES) IMPLANT
SPLINT FIBERGLASS 4X30 (CAST SUPPLIES) ×2 IMPLANT
SPONGE LAP 18X18 RF (DISPOSABLE) ×3 IMPLANT
SUCTION FRAZIER HANDLE 10FR (MISCELLANEOUS) ×2
SUCTION TUBE FRAZIER 10FR DISP (MISCELLANEOUS) ×1 IMPLANT
SUT ETHILON 3 0 PS 1 (SUTURE) IMPLANT
SUT VIC AB 0 CT1 27 (SUTURE)
SUT VIC AB 0 CT1 27XBRD ANBCTR (SUTURE) IMPLANT
SUT VIC AB 2-0 CT1 27 (SUTURE) ×3
SUT VIC AB 2-0 CT1 TAPERPNT 27 (SUTURE) ×1 IMPLANT
SYR BULB 3OZ (MISCELLANEOUS) ×3 IMPLANT
SYR CONTROL 10ML LL (SYRINGE) IMPLANT
TAP CANN 5.5MM (BIT) ×2 IMPLANT
TOWEL GREEN STERILE FF (TOWEL DISPOSABLE) ×3 IMPLANT
TUBE CONNECTING 20'X1/4 (TUBING) ×1
TUBE CONNECTING 20X1/4 (TUBING) ×2 IMPLANT
UNDERPAD 30X36 HEAVY ABSORB (UNDERPADS AND DIAPERS) ×3 IMPLANT
YANKAUER SUCT BULB TIP NO VENT (SUCTIONS) ×3 IMPLANT

## 2019-01-04 NOTE — Anesthesia Postprocedure Evaluation (Signed)
Anesthesia Post Note  Patient: [name]  Procedure(s) Performed: OPEN REDUCTION INTERNAL FIXATION (ORIF) RIGHT 5TH METATARSAL FRACTURE (Right Toe)     Patient location during evaluation: PACU Anesthesia Type: General Level of consciousness: awake Pain management: pain level controlled Vital Signs Assessment: post-procedure vital signs reviewed and stable Respiratory status: spontaneous breathing Cardiovascular status: stable Postop Assessment: no apparent nausea or vomiting Anesthetic complications: no    Last Vitals:  Vitals:   01/04/19 1015 01/04/19 1030  BP: 137/89 (!) 136/92  Pulse: 84 80  Resp: (!) 21 13  Temp:    SpO2: 99% 99%    Last Pain:  Vitals:   01/04/19 1030  TempSrc:   PainSc: 0-No pain   Pain Goal: Patients Stated Pain Goal: 3 (01/04/19 0835)      RLE Motor Response: Purposeful movement (01/04/19 1030) RLE Sensation: Numbness (01/04/19 1030)        Huston Foley

## 2019-01-04 NOTE — Anesthesia Preprocedure Evaluation (Signed)
Anesthesia Evaluation  Patient identified by MRN, date of birth, ID band Patient awake    Airway Mallampati: I       Dental no notable dental hx. (+) Teeth Intact   Pulmonary Current Smoker and Patient abstained from smoking.,    breath sounds clear to auscultation       Cardiovascular negative cardio ROS Normal cardiovascular exam Rhythm:Regular Rate:Normal     Neuro/Psych  Headaches, Seizures -,  PSYCHIATRIC DISORDERS Anxiety Depression Schizophrenia    GI/Hepatic negative GI ROS, Neg liver ROS,   Endo/Other  negative endocrine ROS  Renal/GU negative Renal ROS  negative genitourinary   Musculoskeletal negative musculoskeletal ROS (+)   Abdominal Normal abdominal exam  (+)   Peds  Hematology negative hematology ROS (+)   Anesthesia Other Findings   Reproductive/Obstetrics                             Anesthesia Physical Anesthesia Plan  ASA: II  Anesthesia Plan: General   Post-op Pain Management:  Regional for Post-op pain   Induction: Intravenous  PONV Risk Score and Plan: 1 and Ondansetron  Airway Management Planned: LMA  Additional Equipment: None  Intra-op Plan:   Post-operative Plan: Extubation in OR  Informed Consent: I have reviewed the patients History and Physical, chart, labs and discussed the procedure including the risks, benefits and alternatives for the proposed anesthesia with the patient or authorized representative who has indicated his/her understanding and acceptance.     Dental advisory given  Plan Discussed with: CRNA  Anesthesia Plan Comments:         Anesthesia Quick Evaluation

## 2019-01-04 NOTE — H&P (Signed)
PREOPERATIVE H&P  Chief Complaint: Right 5th metatarsal fracture  HPI: [name] is a 38 y.o. male who presents for surgical treatment of Right 5th metatarsal fracture.  He denies any changes in medical history.  Past Medical History:  Diagnosis Date  . Anxiety   . Headache   . History of suicidal ideation   . Schizophrenia [facility])    Past Surgical History:  Procedure Laterality Date  . APPENDECTOMY    . TIBIA FRACTURE SURGERY     right   Social History   Socioeconomic History  . Marital status: Single    Spouse name: Not on file  . Number of children: Not on file  . Years of education: Not on file  . Highest education level: Not on file  Occupational History  . Not on file  Social Needs  . Financial resource strain: Not on file  . Food insecurity    Worry: Not on file    Inability: Not on file  . Transportation needs    Medical: Not on file    Non-medical: Not on file  Tobacco Use  . Smoking status: Current Every Day Smoker    Types: Cigarettes  . Smokeless tobacco: Never Used  Substance and Sexual Activity  . Alcohol use: Yes    Comment: weekly  . Drug use: Yes    Types: Marijuana    Comment: hx of crack use also  . Sexual activity: Yes  Lifestyle  . Physical activity    Days per week: Not on file    Minutes per session: Not on file  . Stress: Not on file  Relationships  . Social Herbalist on phone: Not on file    Gets together: Not on file    Attends religious service: Not on file    Active member of club or organization: Not on file    Attends meetings of clubs or organizations: Not on file    Relationship status: Not on file  Other Topics Concern  . Not on file  Social History Narrative   [name] lives at home with his mother in a house that his brother / guardian provides. 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 Has a history of alcohol drug abuse. Currently smokes marijuana and cigarettes daily  with occasional etoh use (12/30/18).  His brother [name] is his legal guardian.   History reviewed. No pertinent family history. No Known Allergies Prior to Admission medications   Medication Sig Start Date End Date Taking? Authorizing Provider  acetaminophen (TYLENOL) 325 MG tablet Take 650 mg by mouth every 6 (six) hours as needed.   Yes [provider]  Paliperidone Palmitate (INVEGA TRINZA IM) Inject into the muscle.    [provider]     Positive ROS: All other systems have been reviewed and were otherwise negative with the exception of those mentioned in the HPI and as above.  Physical Exam: General: Alert, no acute distress Cardiovascular: No pedal edema Respiratory: No cyanosis, no use of accessory musculature GI: abdomen soft Skin: No lesions in the area of chief complaint Neurologic: Sensation intact distally Psychiatric: Patient is competent for consent with normal mood and affect Lymphatic: no lymphedema  MUSCULOSKELETAL: exam stable  Assessment: Right 5th metatarsal fracture  Plan: Plan for Procedure(s): OPEN REDUCTION INTERNAL FIXATION (ORIF) RIGHT 5TH METATARSAL FRACTURE  The risks benefits and alternatives were discussed with the patient including but not limited to the risks  of nonoperative treatment, versus surgical intervention including infection, bleeding, nerve injury,  blood clots, cardiopulmonary complications, morbidity, mortality, among others, and they were willing to proceed.   Eduard Roux, MD   01/04/2019 8:47 AM

## 2019-01-04 NOTE — Transfer of Care (Signed)
Immediate Anesthesia Transfer of Care Note  Patient: [name]  Procedure(s) Performed: OPEN REDUCTION INTERNAL FIXATION (ORIF) RIGHT 5TH METATARSAL FRACTURE (Right Toe)  Patient Location: PACU  Anesthesia Type:General  Level of Consciousness: awake  Airway & Oxygen Therapy: Patient Spontanous Breathing and Patient connected to face mask oxygen  Post-op Assessment: Report given to RN and Post -op Vital signs reviewed and stable  Post vital signs: Reviewed and stable  Last Vitals:  Vitals Value Taken Time  BP 137/89 01/04/19 1015  Temp    Pulse 105 01/04/19 1017  Resp 22 01/04/19 1017  SpO2 100 % 01/04/19 1017  Vitals shown include unvalidated device data.  Last Pain:  Vitals:   01/04/19 0835  TempSrc:   PainSc: 1       Patients Stated Pain Goal: 3 ([id number])  Complications: No apparent anesthesia complications

## 2019-01-04 NOTE — Op Note (Signed)
   Date of Surgery: 01/04/2019  INDICATIONS: [name] is a 38 y.o.-year-old male with a right Jones fracture;  The patient did consent to the procedure after discussion of the risks and benefits.  PREOPERATIVE DIAGNOSIS: Acute right jones fracture, 5th metatarsal  POSTOPERATIVE DIAGNOSIS: Same.  PROCEDURE: Open treatment with internal fixation of right 5th metatarsal fracture  SURGEON: N. Eduard Roux, M.D.  ASSIST: Ciro Backer [place], [place]; necessary for the timely completion of procedure and due to complexity of procedure.  ANESTHESIA:  general  IV FLUIDS AND URINE: See anesthesia.  ESTIMATED BLOOD LOSS: minimal mL.  IMPLANTS: Arthrex 5.5 mm x 45 mm partially threaded screw  DRAINS: none  COMPLICATIONS: see description of procedure.  DESCRIPTION OF PROCEDURE: The patient was brought to the operating room and placed supine on the operating table.  The patient had been signed prior to the procedure and this was documented. The patient had the anesthesia placed by the anesthesiologist.  A time-out was performed to confirm that this was the correct patient, site, side and location. The patient did receive antibiotics prior to the incision and was re-dosed during the procedure as needed at indicated intervals.  A tourniquet was placed.  The patient had the operative extremity prepped and draped in the standard surgical fashion.    A K wire was placed on the skin using fluoroscopic guidance to localize the start site.  This is stab incision was then made at the appropriate area on the foot.  The soft tissues were then bluntly dissected down onto the base of the fifth metatarsal.  The K wire was then advanced down the medullary canal of the fifth metatarsal using fluoroscopic guidance on orthogonal views.  Once the K wire was right the appropriate depth the appropriate measurement was made.  The entry drill was then used up over the K wire.  Sequential tapping was then performed up to a 5.5  mm tap which gave good purchase on the inner cortex.  The tap was then removed and a 5.5 mm x 45 mm partially-threaded screw was then placed by hand into the medullary canal of the fifth metatarsal with good compression of the fracture and purchase of the threads.  Final x-rays were taken.  The wound was then thoroughly irrigated with normal saline.  The incision was closed with interrupted nylon sutures.  Sterile dressing applied.  Foot was placed in a short leg splint.  Patient tolerated procedure complications.  POSTOPERATIVE PLAN: Nonweightbearing with crutches.  Aspirin for DVT prophylaxis.  Follow-up in 2 weeks for suture removal.  Clinton Cecil, MD 12:38 PM

## 2019-01-04 NOTE — Anesthesia Procedure Notes (Signed)
Anesthesia Regional Block: Popliteal block   Pre-Anesthetic Checklist: ,, timeout performed, Correct Patient, Correct Site, Correct Laterality, Correct Procedure, Correct Position, site marked, Risks and benefits discussed,  Surgical consent,  Pre-op evaluation,  At surgeon's request and post-op pain management  Laterality: Lower and Right  Prep: chloraprep       Needles:  Injection technique: Single-shot  Needle Type: Echogenic Stimulator Needle     Needle Length: 10cm  Needle Gauge: 21   Needle insertion depth: 1 cm   Additional Needles:   Procedures:,,,, ultrasound used (permanent image in chart),,,,  Narrative:  Start time: 01/04/2019 8:30 AM End time: 01/04/2019 8:37 AM Injection made incrementally with aspirations every 5 mL.  Performed by: Personally  Anesthesiologist: Lyn Hollingshead, MD

## 2019-01-04 NOTE — Discharge Instructions (Signed)
1. Keep splint clean and dry 2. Elevate foot above level of the heart 3. Take aspirin to prevent blood clots 4. Take pain meds as needed 5. Strict non weight bearing to operative extremity    Post Anesthesia Home Care Instructions  Activity: Get plenty of rest for the remainder of the day. A responsible individual must stay with you for 24 hours following the procedure.  For the next 24 hours, DO NOT: -Drive a car -Paediatric nurse -Drink alcoholic beverages -Take any medication unless instructed by your physician -Make any legal decisions or sign important papers.  Meals: Start with liquid foods such as gelatin or soup. Progress to regular foods as tolerated. Avoid greasy, spicy, heavy foods. If nausea and/or vomiting occur, drink only clear liquids until the nausea and/or vomiting subsides. Call your physician if vomiting continues.  Special Instructions/Symptoms: Your throat may feel dry or sore from the anesthesia or the breathing tube placed in your throat during surgery. If this causes discomfort, gargle with warm salt water. The discomfort should disappear within 24 hours.  If you had a scopolamine patch placed behind your ear for the management of post- operative nausea and/or vomiting:  1. The medication in the patch is effective for 72 hours, after which it should be removed.  Wrap patch in a tissue and discard in the trash. Wash hands thoroughly with soap and water. 2. You may remove the patch earlier than 72 hours if you experience unpleasant side effects which may include dry mouth, dizziness or visual disturbances. 3. Avoid touching the patch. Wash your hands with soap and water after contact with the patch.       Regional Anesthesia Blocks  1. Numbness or the inability to move the "blocked" extremity may last from 3-48 hours after placement. The length of time depends on the medication injected and your individual response to the medication. If the numbness  is not going away after 48 hours, call your surgeon.  2. The extremity that is blocked will need to be protected until the numbness is gone and the  Strength has returned. Because you cannot feel it, you will need to take extra care to avoid injury. Because it may be weak, you may have difficulty moving it or using it. You may not know what position it is in without looking at it while the block is in effect.  3. For blocks in the legs and feet, returning to weight bearing and walking needs to be done carefully. You will need to wait until the numbness is entirely gone and the strength has returned. You should be able to move your leg and foot normally before you try and bear weight or walk. You will need someone to be with you when you first try to ensure you do not fall and possibly risk injury.  4. Bruising and tenderness at the needle site are common side effects and will resolve in a few days.  5. Persistent numbness or new problems with movement should be communicated to the surgeon or the [place] [phone] [place] [phone]).Regional Anesthesia Blocks  1. Numbness or the inability to move the "blocked" extremity may last from 3-48 hours after placement. The length of time depends on the medication injected and your individual response to the medication. If the numbness is not going away after 48 hours, call your surgeon.  2. The extremity that is blocked will need to be protected until the numbness is gone  and the  Strength has returned. Because you cannot feel it, you will need to take extra care to avoid injury. Because it may be weak, you may have difficulty moving it or using it. You may not know what position it is in without looking at it while the block is in effect.  3. For blocks in the legs and feet, returning to weight bearing and walking needs to be done carefully. You will need to wait until the numbness is entirely gone and the strength  has returned. You should be able to move your leg and foot normally before you try and bear weight or walk. You will need someone to be with you when you first try to ensure you do not fall and possibly risk injury.  4. Bruising and tenderness at the needle site are common side effects and will resolve in a few days.  5. Persistent numbness or new problems with movement should be communicated to the surgeon or the [facility] [phone] [place] [phone]).

## 2019-01-04 NOTE — Anesthesia Procedure Notes (Signed)
Procedure Name: LMA Insertion Performed by: Kiaya Haliburton H, CRNA Pre-anesthesia Checklist: Patient identified, Emergency Drugs available, Suction available and Patient being monitored Patient Re-evaluated:Patient Re-evaluated prior to induction Oxygen Delivery Method: Circle System Utilized Preoxygenation: Pre-oxygenation with 100% oxygen Induction Type: IV induction Ventilation: Mask ventilation without difficulty LMA: LMA inserted LMA Size: 5.0 Number of attempts: 1 Airway Equipment and Method: Bite block Placement Confirmation: positive ETCO2 Tube secured with: Tape Dental Injury: Teeth and Oropharynx as per pre-operative assessment        

## 2019-01-04 NOTE — Progress Notes (Signed)
Assisted Dr. Jillyn Hidden with right, ultrasound guided, popliteal block. Side rails up, monitors on throughout procedure. See vital signs in flow sheet. Tolerated Procedure well.

## 2019-01-05 ENCOUNTER — Encounter (HOSPITAL_BASED_OUTPATIENT_CLINIC_OR_DEPARTMENT_OTHER): Payer: Self-pay | Admitting: Orthopaedic Surgery

## 2019-01-18 ENCOUNTER — Other Ambulatory Visit: Payer: Self-pay

## 2019-01-18 ENCOUNTER — Ambulatory Visit (INDEPENDENT_AMBULATORY_CARE_PROVIDER_SITE_OTHER): Payer: Medicare Other

## 2019-01-18 ENCOUNTER — Ambulatory Visit (INDEPENDENT_AMBULATORY_CARE_PROVIDER_SITE_OTHER): Payer: Medicare Other | Admitting: Orthopaedic Surgery

## 2019-01-18 ENCOUNTER — Encounter: Payer: Self-pay | Admitting: Orthopaedic Surgery

## 2019-01-18 ENCOUNTER — Ambulatory Visit: Payer: Self-pay

## 2019-01-18 DIAGNOSIS — S99191A Other physeal fracture of right metatarsal, initial encounter for closed fracture: Secondary | ICD-10-CM

## 2019-01-18 NOTE — Progress Notes (Signed)
   Post-Op Visit Note   Patient: [name]           Date of Birth: [birth year]           MRN: [id number] Visit Date: 01/18/2019 PCP: Patient, No Pcp Per   Assessment & Plan:  Chief Complaint:  Chief Complaint  Patient presents with  . Right Foot - Pain, Routine Post Op   Visit Diagnoses:  1. Closed fracture of base of fifth metatarsal bone of right foot at metaphyseal-diaphyseal junction, initial encounter     Plan: Patient is 2 weeks status post intramedullary screw fixation of a Jones fracture.  He reports no pain today.  He is doing well overall.  Surgical incision has healed without signs of infection.  He has mild swelling.  X-rays demonstrate stable fixation.  At this point we will take the sutures out and placed in a cam boot.  Continue nonweightbearing.  Prescription for knee scooter.  Follow-up in 2 weeks with three-view x-rays of the right foot.  Follow-Up Instructions: Return in about 2 weeks (around 02/01/2019).   Orders:  Orders Placed This Encounter  Procedures  . XR Foot Complete Right   No orders of the defined types were placed in this encounter.   Imaging: Xr Foot Complete Right  Result Date: 01/18/2019 Stable intramedullary screw fixation of Jones fracture   PMFS History: Patient Active Problem List   Diagnosis Date Noted  . Closed fracture of base of fifth metatarsal bone of right foot at metaphyseal-diaphyseal junction 12/30/2018  . Schizoaffective disorder, depressive type ([place])   . Schizophrenia ([place]) 03/09/2018  . Schizophrenia, paranoid type ([place]) 10/11/2017  . Overdose 03/05/2015  . Seizures ([place])   . Tachycardia   . Right shoulder pain    Past Medical History:  Diagnosis Date  . Anxiety   . Headache   . History of suicidal ideation   . Schizophrenia ([place])     History reviewed. No pertinent family history.  Past Surgical History:  Procedure Laterality Date  . APPENDECTOMY    . ORIF TOE FRACTURE Right 01/04/2019   Procedure:  OPEN REDUCTION INTERNAL FIXATION (ORIF) RIGHT 5TH METATARSAL FRACTURE;  Surgeon: Leandrew Koyanagi, MD;  Location: [facility];  Service: Orthopedics;  Laterality: Right;  . TIBIA FRACTURE SURGERY     right   Social History   Occupational History  . Not on file  Tobacco Use  . Smoking status: Current Every Day Smoker    Types: Cigarettes  . Smokeless tobacco: Never Used  Substance and Sexual Activity  . Alcohol use: Yes    Comment: weekly  . Drug use: Yes    Types: Marijuana    Comment: hx of crack use also  . Sexual activity: Yes

## 2019-02-01 ENCOUNTER — Ambulatory Visit: Payer: Medicare Other | Admitting: Orthopaedic Surgery

## 2019-02-07 ENCOUNTER — Ambulatory Visit (INDEPENDENT_AMBULATORY_CARE_PROVIDER_SITE_OTHER): Payer: Medicare Other

## 2019-02-07 ENCOUNTER — Ambulatory Visit (INDEPENDENT_AMBULATORY_CARE_PROVIDER_SITE_OTHER): Payer: Medicare Other | Admitting: Orthopaedic Surgery

## 2019-02-07 ENCOUNTER — Encounter: Payer: Self-pay | Admitting: Orthopaedic Surgery

## 2019-02-07 ENCOUNTER — Other Ambulatory Visit: Payer: Self-pay

## 2019-02-07 VITALS — Ht 68.0 in | Wt 180.0 lb

## 2019-02-07 DIAGNOSIS — S99191A Other physeal fracture of right metatarsal, initial encounter for closed fracture: Secondary | ICD-10-CM

## 2019-02-07 NOTE — Progress Notes (Signed)
   Post-Op Visit Note   Patient: [name]           Date of Birth: [birth year]           MRN: [id number] Visit Date: 02/07/2019 PCP: Patient, No Pcp Per   Assessment & Plan:  Chief Complaint:  Chief Complaint  Patient presents with  . Right Foot - Routine Post Op    Right ORIF 5th MT fracture DOS 01/04/2019   Visit Diagnoses:  1. Closed fracture of base of fifth metatarsal bone of right foot at metaphyseal-diaphyseal junction, initial encounter     Plan: Patient is approximately 4 weeks status post ORIF right [place] fracture.  He is overall doing well.  He still has moderate swelling of the right foot.  Surgical incision is healed.  He has no tenderness to the base of the fifth metatarsal.  X-rays demonstrate stable fixation.  There is no resorption of the fracture any worsening.  At this point he may start to bear weight in a postop shoe with or without crutches.  I would like to get him set up for physical therapy for gait training.  I would like to recheck him in 4 weeks with three-view x-rays of the right foot.  Follow-Up Instructions: Return in about 4 weeks (around 03/07/2019).   Orders:  Orders Placed This Encounter  Procedures  . XR Foot Complete Right  . Ambulatory referral to Physical Therapy   No orders of the defined types were placed in this encounter.   Imaging: Xr Foot Complete Right  Result Date: 02/07/2019 Stable intramedullary screw fixation of fifth metatarsal fracture.  No displacement of the fracture.   PMFS History: Patient Active Problem List   Diagnosis Date Noted  . Closed fracture of base of fifth metatarsal bone of right foot at metaphyseal-diaphyseal junction 12/30/2018  . Schizoaffective disorder, depressive type ([place])   . Schizophrenia ([place]) 03/09/2018  . Schizophrenia, paranoid type ([place]) 10/11/2017  . Overdose 03/05/2015  . Seizures ([place])   . Tachycardia   . Right shoulder pain    Past Medical History:  Diagnosis Date  . Anxiety    . Headache   . History of suicidal ideation   . Schizophrenia ([place])     History reviewed. No pertinent family history.  Past Surgical History:  Procedure Laterality Date  . APPENDECTOMY    . ORIF TOE FRACTURE Right 01/04/2019   Procedure: OPEN REDUCTION INTERNAL FIXATION (ORIF) RIGHT 5TH METATARSAL FRACTURE;  Surgeon: Leandrew Koyanagi, MD;  Location: [facility];  Service: Orthopedics;  Laterality: Right;  . TIBIA FRACTURE SURGERY     right   Social History   Occupational History  . Not on file  Tobacco Use  . Smoking status: Current Every Day Smoker    Types: Cigarettes  . Smokeless tobacco: Never Used  Substance and Sexual Activity  . Alcohol use: Yes    Comment: weekly  . Drug use: Yes    Types: Marijuana    Comment: hx of crack use also  . Sexual activity: Yes

## 2019-03-07 ENCOUNTER — Encounter: Payer: Self-pay | Admitting: Orthopaedic Surgery

## 2019-03-07 ENCOUNTER — Other Ambulatory Visit: Payer: Self-pay

## 2019-03-07 ENCOUNTER — Ambulatory Visit (INDEPENDENT_AMBULATORY_CARE_PROVIDER_SITE_OTHER): Payer: Medicare Other | Admitting: Orthopaedic Surgery

## 2019-03-07 ENCOUNTER — Ambulatory Visit (INDEPENDENT_AMBULATORY_CARE_PROVIDER_SITE_OTHER): Payer: Medicare Other

## 2019-03-07 DIAGNOSIS — S99191A Other physeal fracture of right metatarsal, initial encounter for closed fracture: Secondary | ICD-10-CM

## 2019-03-07 MED ORDER — TRAMADOL HCL 50 MG PO TABS: 50.0000 mg | ORAL_TABLET | Freq: Three times a day (TID) | ORAL | 0 refills | Status: DC | PRN

## 2019-03-07 NOTE — Progress Notes (Signed)
Post-Op Visit Note   Patient: [name]           Date of Birth: [birth year]           MRN: [id number] Visit Date: 03/07/2019 PCP: Patient, No Pcp Per   Assessment & Plan:  Chief Complaint:  Chief Complaint  Patient presents with  . Right Foot - Pain, Follow-up   Visit Diagnoses:  1. Closed fracture of base of fifth metatarsal bone of right foot at metaphyseal-diaphyseal junction, initial encounter     Plan: Patient is a pleasant 38 year old gentleman who presents our clinic today approximately 8 weeks status post ORIF right foot fifth metatarsal fracture, date of surgery 01/04/2019.  He has been doing well over the past 2 weeks.  He notes slight continued pain to the right lateral foot worse with ambulation.  He has been wearing a cam boot which does seem to help with this.  He has not been compliant elevating his right lower extremity.  He has not yet started physical therapy.  Examination of his right foot reveals mild to moderate swelling.  He does have moderate tenderness to the fracture site.  He is neurovascularly intact distally.  At this point, his fracture is demonstrated sufficient healing to allow him to come out of his cam walker into a regular shoe weightbearing as tolerated.  I have sent in a new prescription for formal physical therapy.  He will call us within a week if he has not heard from them.  He will follow-up with [place] in 4 weeks time for repeat evaluation and 3 view x-rays of the right foot.  Follow-Up Instructions: Return in about 4 weeks (around 04/04/2019).   Orders:  Orders Placed This Encounter  Procedures  . XR Foot Complete Right  . Ambulatory referral to Physical Therapy   Meds ordered this encounter  Medications  . traMADol (ULTRAM) 50 MG tablet    Sig: Take 1 tablet (50 mg total) by mouth 3 (three) times daily as needed.    Dispense:  30 tablet    Refill:  0    Imaging: No results found.  PMFS History: Patient Active Problem List   Diagnosis Date Noted  . Closed fracture of base of fifth metatarsal bone of right foot at metaphyseal-diaphyseal junction 12/30/2018  . Schizoaffective disorder, depressive type ([place])   . Schizophrenia ([place]) 03/09/2018  . Schizophrenia, paranoid type ([place]) 10/11/2017  . Overdose 03/05/2015  . Seizures ([place])   . Tachycardia   . Right shoulder pain    Past Medical History:  Diagnosis Date  . Anxiety   . Headache   . History of suicidal ideation   . Schizophrenia ([place])     History reviewed. No pertinent family history.  Past Surgical History:  Procedure Laterality Date  . APPENDECTOMY    . ORIF TOE FRACTURE Right 01/04/2019   Procedure: OPEN REDUCTION INTERNAL FIXATION (ORIF) RIGHT 5TH METATARSAL FRACTURE;  Surgeon: Leandrew Koyanagi, MD;  Location: [facility];  Service: Orthopedics;  Laterality: Right;  . TIBIA FRACTURE SURGERY     right   Social History   Occupational History  . Not on file  Tobacco Use  . Smoking status: Current Every Day Smoker    Types: Cigarettes  . Smokeless tobacco: Never Used  Substance and Sexual Activity  . Alcohol use: Yes    Comment: weekly  . Drug use: Yes    Types: Marijuana    Comment: hx of crack use also  .  Sexual activity: Yes

## 2019-03-14 ENCOUNTER — Other Ambulatory Visit: Payer: Self-pay

## 2019-03-14 ENCOUNTER — Encounter: Payer: Self-pay | Admitting: Physical Therapy

## 2019-03-14 ENCOUNTER — Ambulatory Visit: Payer: Medicare Other | Attending: Physician Assistant | Admitting: Physical Therapy

## 2019-03-14 DIAGNOSIS — M25571 Pain in right ankle and joints of right foot: Secondary | ICD-10-CM

## 2019-03-14 DIAGNOSIS — R600 Localized edema: Secondary | ICD-10-CM | POA: Diagnosis present

## 2019-03-14 DIAGNOSIS — M25671 Stiffness of right ankle, not elsewhere classified: Secondary | ICD-10-CM | POA: Insufficient documentation

## 2019-03-14 DIAGNOSIS — M6281 Muscle weakness (generalized): Secondary | ICD-10-CM | POA: Diagnosis present

## 2019-03-14 NOTE — Therapy (Signed)
[place], [place], [address] Phone: [phone]   Fax:  [phone]  Physical Therapy Treatment  Patient Details  Name: [name] MRN: [id number] Date of Birth: 03-09-1981 Referring Provider (PT): Aundra Dubin, [place]    Encounter Date: 03/14/2019  PT End of Session - 03/14/19 1458    Visit Number  1    Number of Visits  17    Date for PT Re-Evaluation  05/09/19    Authorization Type  MCR: Kx at 15th visit, progress note at 10th visit    PT Start Time  1415    PT Stop Time  1458    PT Time Calculation (min)  43 min    Activity Tolerance  Patient tolerated treatment well    Behavior During Therapy  [facility] for tasks assessed/performed       Past Medical History:  Diagnosis Date  . Anxiety   . Headache   . History of suicidal ideation   . Schizophrenia [facility])     Past Surgical History:  Procedure Laterality Date  . APPENDECTOMY    . ORIF TOE FRACTURE Right 01/04/2019   Procedure: OPEN REDUCTION INTERNAL FIXATION (ORIF) RIGHT 5TH METATARSAL FRACTURE;  Surgeon: Leandrew Koyanagi, MD;  Location: [place];  Service: Orthopedics;  Laterality: Right;  . TIBIA FRACTURE SURGERY     right    There were no vitals filed for this visit.  Subjective Assessment - 03/14/19 1431    Subjective  pt is a 38 y.o M with CC of R foot pain s/p fifth metarsal ORIF after falling down the stairs on 12/27/2018. He had ORIF on 01/04/2019 and currently iswearing  cam boot with walking and when he isn't walking with the boot he used a RW. He denies any N/T and reports the foot is getting better but he still feels limited with weight bearing    Limitations  Standing;Walking;Lifting    How long can you sit comfortably?  unlimited    How long can you stand comfortably?  10 min    How long can you walk comfortably?  5-10 min    Diagnostic tests  x-ray 03/07/2019 healing well per MD    Patient Stated Goals  to get  back to wearing shoes, return to regular ADLs, decrease pain,    Currently in Pain?  Yes    Pain Score  4    max 5/10   Pain Location  Foot    Pain Orientation  Right    Pain Descriptors / Indicators  Other (Comment)   uncomfortable.   Pain Type  Surgical pain    Pain Onset  More than a month ago    Pain Frequency  Constant    Aggravating Factors   standing/ walking, weight bearing outside of camboot    Pain Relieving Factors  sitting, resting         [facility] PT Assessment - 03/14/19 1429      Assessment   Medical Diagnosis   Closed fracture of base of fifth metatarsal bone of right foot at metaphyseal-diaphyseal junction, initial encounter     Referring Provider (PT)  Aundra Dubin, PA-C     Onset Date/Surgical Date  01/04/19    Hand Dominance  Right    Next MD Visit  --   january, 2021   Prior Therapy  no      Precautions   Precautions  None  Restrictions   Weight Bearing Restrictions  --   w   Other Position/Activity Restrictions  WBAT      Balance Screen   Has the patient fallen in the past 6 months  Yes    How many times?  1    Has the patient had a decrease in activity level because of a fear of falling?   No    Is the patient reluctant to leave their home because of a fear of falling?   No      Home Social worker  Private residence    Living Arrangements  Parent    Available Help at Discharge  Family    Type of [place] to enter    Entrance Stairs-Number of Steps  5    Entrance Stairs-Rails  Can reach both    [place]  One level    [place] - 2 wheels;Other (comment)   tall camboot     Prior Function   Level of Independence  Independent with basic ADLs    Vocation  On disability      Cognition   Overall Cognitive Status  Within Functional Limits for tasks assessed      Observation/Other Assessments   Observations  pitting edema noted along R lateral ankle    Focus on Therapeutic  Outcomes (FOTO)   65% limited    predicted 43% limited     Observation/Other Assessments-Edema    Edema  Figure 8      Figure 8 Edema   Figure 8 - Right   57.6 cm      ROM / Strength   AROM / PROM / Strength  AROM;Strength;PROM      AROM   AROM Assessment Site  Ankle    Right/Left Ankle  Right;Left    Right Ankle Dorsiflexion  4    Right Ankle Plantar Flexion  20    Right Ankle Inversion  18    Right Ankle Eversion  8    Left Ankle Dorsiflexion  12    Left Ankle Plantar Flexion  48    Left Ankle Inversion  30    Left Ankle Eversion  20      PROM   PROM Assessment Site  Ankle    Right/Left Ankle  Right;Left    Right Ankle Dorsiflexion  8    Right Ankle Plantar Flexion  40      Strength   Overall Strength Comments  pain noted during all R ankle strengthening    Strength Assessment Site  Ankle    Right/Left Ankle  Right;Left    Left Ankle Dorsiflexion  5/5    Left Ankle Plantar Flexion  5/5    Left Ankle Inversion  5/5    Left Ankle Eversion  5/5      Palpation   Palpation comment  TTP along the base of the fifth metarsal and sinus tarsi.      Ambulation/Gait   Ambulation/Gait  Yes    Assistive device  --   tall camboot   Gait Pattern  Step-through pattern;Decreased stride length;Trendelenburg;Antalgic;Decreased stance time - right;Decreased step length - left                   [facility] Adult PT Treatment/Exercise - 03/14/19 1429      Ankle Exercises: Standing   Other Standing Ankle Exercises  lateral weight shifting 1 x 10 holding 5  sec ea., forward weight shifting 1 x 10 holding 5 sec ea.      Ankle Exercises: Seated   ABC's  1 rep   cues to focus on ankle ROM vs RLE   Heel Raises  10 reps;Right    Toe Raise  10 reps    Heel Slides  10 reps;Right   cues to keep heel down            PT Education - 03/14/19 1457    Education Details  evaluation findings, POC, goals, HEP with proper form/ rationale. gradually weaning from the cam boot  starting 30 min a day and progress each day. pain education that pain isn't meaning damage.    Person(s) Educated  Patient    Methods  Explanation;Verbal cues;Handout    Comprehension  Verbalized understanding;Verbal cues required       PT Short Term Goals - 03/14/19 1553      PT SHORT TERM GOAL #1   Title  pt to be I with inital HEP    Time  4    Period  Weeks    Status  New    Target Date  04/11/19      PT SHORT TERM GOAL #2   Title  increase R ankle DF to >/=6 degrees and PF to >/= 30 degrees to promote functional ROM    Time  4    Period  Weeks    Status  New    Target Date  04/11/19      PT SHORT TERM GOAL #3   Title  pt to be able to wean from the cam boot to >/= 80% throughout a 1 week period for progression of function    Time  4    Period  Weeks    Status  New    Target Date  04/11/19      PT SHORT TERM GOAL #4   Title  reduce R ankle edema by >/= 1 cm to assist with ROM and reduction of pain        PT Long Term Goals - 03/14/19 1554      PT LONG TERM GOAL #1   Title  increase R ankle ROM WFL compared bil with no report of pain or limitation for functional and efficient gait    Time  8    Period  Weeks    Status  New    Target Date  05/09/19      PT LONG TERM GOAL #2   Title  pt to increase gross R ankle strength to >/=4+/5 to maximize ankle stability    Time  8    Period  Weeks    Status  New    Target Date  05/09/19      PT LONG TERM GOAL #3   Title  pt to be able to walk/ stand >/= 30 min and navigate up/ down >/= 12 steps reciprocally with no limitation or pain    Time  8    Period  Weeks    Status  New    Target Date  05/09/19      PT LONG TERM GOAL #4   Title  increase FOTO score to </=43% limited to demo improvement in function    Time  8    Period  Weeks    Status  New    Target Date  05/09/19      PT LONG TERM GOAL #5   Title  pt  to be I with all HEP given as of last visit to maintain and progress current level of function    Time   8    Period  Weeks    Status  New    Target Date  05/09/19            Plan - 03/14/19 1459    Clinical Impression Statement  pt presents to OPPT with CC of R foot/ ankle pain following a Fifth metatarsal ORIF on 01/04/2019 due to fall down the stairs. He demonstrates limited AROM / PROM and associated weakness of the R ankle secondary to pain and swelling. pt does exhibit a increased guarding and apprehension regarding pain and ankle mobility. He would benefit from physical therapy to decrease R ankle pain, reduce swelling, promote strength, and maximize function by addressing the deficits listed.    Personal Factors and Comorbidities  Age;Comorbidity 1    Comorbidities  hx of anxiety    Examination-Activity Limitations  Locomotion Level;Lift;Stand;Stairs    Stability/Clinical Decision Making  Evolving/Moderate complexity    Clinical Decision Making  Moderate    Rehab Potential  Good    PT Frequency  2x / week    PT Duration  8 weeks    PT Treatment/Interventions  ADLs/Self Care Home Management;Cryotherapy;Electrical Stimulation;Iontophoresis 4mg /ml Dexamethasone;Moist Heat;Ultrasound;Stair training;Functional mobility training;Therapeutic activities;Therapeutic exercise;Balance training;Neuromuscular re-education;Manual techniques;Passive range of motion;Vasopneumatic Device;Taping    PT Next Visit Plan  review/ update HEP PRN, Ankle ROM, edema reduction, weight shifting, ankle strengthening/ baps. pain education    PT Home Exercise Plan  [id number] -  lateral weight shifting, forward weight shifting, ankle abcs, heel slide, seated heel/ toe raise    Consulted and Agree with Plan of Care  Patient       Patient will benefit from skilled therapeutic intervention in order to improve the following deficits and impairments:  Improper body mechanics, Postural dysfunction, Abnormal gait, Pain, Decreased balance, Decreased endurance, Decreased strength, Decreased range of motion, Decreased  activity tolerance, Increased edema  Visit Diagnosis: Pain in right ankle and joints of right foot  Muscle weakness (generalized)  Localized edema  Stiffness of right ankle, not elsewhere classified     Problem List Patient Active Problem List   Diagnosis Date Noted  . Closed fracture of base of fifth metatarsal bone of right foot at metaphyseal-diaphyseal junction 12/30/2018  . Schizoaffective disorder, depressive type ([place])   . Schizophrenia ([place]) 03/09/2018  . Schizophrenia, paranoid type ([place]) 10/11/2017  . Overdose 03/05/2015  . Seizures ([place])   . Tachycardia   . Right shoulder pain    [place] PT, DPT, LAT, ATC  03/14/19  4:03 PM      [facility] [address] [place], [place], [address] Phone: [phone]   Fax:  [phone]  Name: [name] MRN: [id number] Date of Birth: 1980-09-26

## 2019-03-21 ENCOUNTER — Other Ambulatory Visit: Payer: Self-pay

## 2019-03-21 ENCOUNTER — Ambulatory Visit: Payer: Medicare Other | Admitting: Physical Therapy

## 2019-03-21 DIAGNOSIS — M25571 Pain in right ankle and joints of right foot: Secondary | ICD-10-CM | POA: Diagnosis not present

## 2019-03-21 DIAGNOSIS — R600 Localized edema: Secondary | ICD-10-CM

## 2019-03-21 DIAGNOSIS — M6281 Muscle weakness (generalized): Secondary | ICD-10-CM

## 2019-03-21 DIAGNOSIS — M25671 Stiffness of right ankle, not elsewhere classified: Secondary | ICD-10-CM

## 2019-03-21 NOTE — Therapy (Signed)
[place], [place], [address] Phone: [phone]   Fax:  [phone]  Physical Therapy Treatment  Patient Details  Name: [name] MRN: [id number] Date of Birth: 1980-07-17 Referring Provider (PT): Aundra Dubin, [place]    Encounter Date: 03/21/2019  PT End of Session - 03/21/19 0858    Visit Number  2    Number of Visits  17    Date for PT Re-Evaluation  05/09/19    Authorization Type  MCR: Kx at 15th visit, progress note at 10th visit    PT Start Time  0855   10 minutes late   PT Stop Time  0932    PT Time Calculation (min)  37 min       Past Medical History:  Diagnosis Date  . Anxiety   . Headache   . History of suicidal ideation   . Schizophrenia [facility])     Past Surgical History:  Procedure Laterality Date  . APPENDECTOMY    . ORIF TOE FRACTURE Right 01/04/2019   Procedure: OPEN REDUCTION INTERNAL FIXATION (ORIF) RIGHT 5TH METATARSAL FRACTURE;  Surgeon: Leandrew Koyanagi, MD;  Location: [facility];  Service: Orthopedics;  Laterality: Right;  . TIBIA FRACTURE SURGERY     right    There were no vitals filed for this visit.  Subjective Assessment - 03/21/19 0856    Subjective  I tried going without the boot around the house. It was not that bad. I tried to elevate but I did not ice it.    Currently in Pain?  No/denies    Aggravating Factors   moving the ankle in out up or down.    Pain Relieving Factors  sit, rest                       [facility] Adult PT Treatment/Exercise - 03/21/19 0001      Modalities   Modalities  Vasopneumatic      Vasopneumatic   Number Minutes Vasopneumatic   10 minutes    Vasopnuematic Location   Ankle    Vasopneumatic Pressure  Low    Vasopneumatic Temperature   36   2.5 snowflakes     Ankle Exercises: Standing   Other Standing Ankle Exercises  lateral weight shifting 1 x 10 holding 5 sec ea., forward weight shifting 1 x 10  holding 5 sec ea.      Ankle Exercises: Seated   ABC's  1 rep   cues to focus on ankle ROM vs RLE   Ankle Circles/Pumps  10 reps    Heel Raises  10 reps;Right    Toe Raise  10 reps    BAPS  Sitting;Level 1               PT Short Term Goals - 03/14/19 1553      PT SHORT TERM GOAL #1   Title  pt to be I with inital HEP    Time  4    Period  Weeks    Status  New    Target Date  04/11/19      PT SHORT TERM GOAL #2   Title  increase R ankle DF to >/=6 degrees and PF to >/= 30 degrees to promote functional ROM    Time  4    Period  Weeks    Status  New    Target Date  04/11/19  PT SHORT TERM GOAL #3   Title  pt to be able to wean from the cam boot to >/= 80% throughout a 1 week period for progression of function    Time  4    Period  Weeks    Status  New    Target Date  04/11/19      PT SHORT TERM GOAL #4   Title  reduce R ankle edema by >/= 1 cm to assist with ROM and reduction of pain    Baseline  inital measure 57.6cm    Time  4    Period  Weeks    Status  New    Target Date  04/11/19        PT Long Term Goals - 03/14/19 1554      PT LONG TERM GOAL #1   Title  increase R ankle ROM WFL compared bil with no report of pain or limitation for functional and efficient gait    Time  8    Period  Weeks    Status  New    Target Date  05/09/19      PT LONG TERM GOAL #2   Title  pt to increase gross R ankle strength to >/=4+/5 to maximize ankle stability    Time  8    Period  Weeks    Status  New    Target Date  05/09/19      PT LONG TERM GOAL #3   Title  pt to be able to walk/ stand >/= 30 min and navigate up/ down >/= 12 steps reciprocally with no limitation or pain    Time  8    Period  Weeks    Status  New    Target Date  05/09/19      PT LONG TERM GOAL #4   Title  increase FOTO score to </=43% limited to demo improvement in function    Time  8    Period  Weeks    Status  New    Target Date  05/09/19      PT LONG TERM GOAL #5   Title  pt  to be I with all HEP given as of last visit to maintain and progress current level of function    Time  8    Period  Weeks    Status  New    Target Date  05/09/19            Plan - 03/21/19 0859    Clinical Impression Statement  Pt arrives with cam boot and reports he has attempted gait in his home without boot without much increased pain. he did not ice and elevate as recommended and edema is unchanged per his report. Reviewed weight shifting and HEP. Began Baps and ankle towel exercises for mobility. Some increased pain with inversion and eversion. Trial of Vasp for edema. Asked him to bring shoe to next appoinmtment.    PT Next Visit Plan  review/ update HEP PRN, Ankle ROM, edema reduction, weight shifting, ankle strengthening/ baps. pain education    PT Home Exercise Plan  [id number] -  lateral weight shifting, forward weight shifting, ankle abcs, heel slide, seated heel/ toe raise       Patient will benefit from skilled therapeutic intervention in order to improve the following deficits and impairments:  Improper body mechanics, Postural dysfunction, Abnormal gait, Pain, Decreased balance, Decreased endurance, Decreased strength, Decreased range of motion, Decreased activity tolerance, Increased edema  Visit Diagnosis: Pain in right ankle and joints of right foot  Muscle weakness (generalized)  Localized edema  Stiffness of right ankle, not elsewhere classified     Problem List Patient Active Problem List   Diagnosis Date Noted  . Closed fracture of base of fifth metatarsal bone of right foot at metaphyseal-diaphyseal junction 12/30/2018  . Schizoaffective disorder, depressive type ([place])   . Schizophrenia ([place]) 03/09/2018  . Schizophrenia, paranoid type ([place]) 10/11/2017  . Overdose 03/05/2015  . Seizures ([place])   . Tachycardia   . Right shoulder pain     Dorene Ar, [place] 03/21/2019, 9:49 AM  [place], [place], [address] Phone: [phone]   Fax:  [phone]  Name: [name] MRN: [id number] Date of Birth: May 22, 1980

## 2019-03-22 ENCOUNTER — Ambulatory Visit: Payer: Medicare Other | Admitting: Physical Therapy

## 2019-03-22 ENCOUNTER — Encounter: Payer: Self-pay | Admitting: Physical Therapy

## 2019-03-22 DIAGNOSIS — M25571 Pain in right ankle and joints of right foot: Secondary | ICD-10-CM

## 2019-03-22 DIAGNOSIS — R600 Localized edema: Secondary | ICD-10-CM

## 2019-03-22 DIAGNOSIS — M6281 Muscle weakness (generalized): Secondary | ICD-10-CM

## 2019-03-22 DIAGNOSIS — M25671 Stiffness of right ankle, not elsewhere classified: Secondary | ICD-10-CM

## 2019-03-23 ENCOUNTER — Encounter: Payer: Self-pay | Admitting: Physical Therapy

## 2019-03-23 ENCOUNTER — Ambulatory Visit: Payer: Medicare Other | Admitting: Physical Therapy

## 2019-03-23 NOTE — Therapy (Signed)
[place], [place], [address] Phone: [phone]   Fax:  [phone]  Physical Therapy Treatment  Patient Details  Name: [name] MRN: [id number] Date of Birth: 05/16/1980 Referring Provider (PT): Aundra Dubin, [place]    Encounter Date: 03/22/2019  PT End of Session - 03/22/19 1522    Visit Number  3    Number of Visits  17    Date for PT Re-Evaluation  05/09/19    Authorization Type  MCR: Kx at 15th visit, progress note at 10th visit    PT Start Time  0850    PT Stop Time  0930    PT Time Calculation (min)  40 min    Activity Tolerance  Patient tolerated treatment well    Behavior During Therapy  [facility] for tasks assessed/performed       Past Medical History:  Diagnosis Date  . Anxiety   . Headache   . History of suicidal ideation   . Schizophrenia [facility])     Past Surgical History:  Procedure Laterality Date  . APPENDECTOMY    . ORIF TOE FRACTURE Right 01/04/2019   Procedure: OPEN REDUCTION INTERNAL FIXATION (ORIF) RIGHT 5TH METATARSAL FRACTURE;  Surgeon: Leandrew Koyanagi, MD;  Location: [place];  Service: Orthopedics;  Laterality: Right;  . TIBIA FRACTURE SURGERY     right    There were no vitals filed for this visit.  Subjective Assessment - 03/22/19 0854    Subjective  Patient reports it is hurting but it is not hurting any more then usual. It did not hurt anymore after his visit yesterday. He has been woering his shoe. He is concerned that it is swelling but per eval the swelling is better.    Limitations  Standing;Walking;Lifting    How long can you sit comfortably?  unlimited    How long can you stand comfortably?  10 min    How long can you walk comfortably?  5-10 min    Diagnostic tests  x-ray 03/07/2019 healing well per MD    Currently in Pain?  Yes    Pain Score  4     Pain Location  Foot    Pain Orientation  Right    Pain Descriptors / Indicators  Aching     Pain Type  Surgical pain    Pain Onset  More than a month ago    Pain Frequency  Constant    Aggravating Factors   moving the ankle up and down                       [facility] Adult PT Treatment/Exercise - 03/23/19 0001      Modalities   Modalities  Cryotherapy   unavailable      Cryotherapy   Cryotherapy Location  Ankle   right      Manual Therapy   Manual Therapy  Passive ROM;Soft tissue mobilization;Joint mobilization    Manual therapy comments  edema manegment     Joint Mobilization  AP glides to increase Dorsiflexion     Soft tissue mobilization  to calf to improve motion     Passive ROM  ankle DF in pain free range       Ankle Exercises: Seated   ABC's  1 rep   cues to focus on ankle ROM vs RLE   Heel Raises  10 reps;Right    Toe Raise  10 reps  BAPS  Sitting;Level 1      Ankle Exercises: Standing   Other Standing Ankle Exercises  lateral weight shifting 1 x 10 holding 5 sec ea., forward weight shifting 1 x 10 holding 5 sec ea.; Standing march 2x10;              PT Education - 03/23/19 1251    Education Details  reviewed the improtance of weight bearing; stretching for ROM    Person(s) Educated  Patient    Methods  Explanation;Demonstration;Tactile cues;Verbal cues    Comprehension  Verbalized understanding;Returned demonstration;Verbal cues required;Tactile cues required       PT Short Term Goals - 03/22/19 1536      PT SHORT TERM GOAL #1   Title  pt to be I with inital HEP    Time  4    Period  Weeks    Status  New    Target Date  04/11/19      PT SHORT TERM GOAL #2   Title  increase R ankle DF to >/=6 degrees and PF to >/= 30 degrees to promote functional ROM    Time  4    Period  Weeks    Status  On-going    Target Date  04/11/19      PT SHORT TERM GOAL #3   Title  pt to be able to wean from the cam boot to >/= 80% throughout a 1 week period for progression of function    Time  4    Period  Weeks    Status  On-going     Target Date  04/11/19      PT SHORT TERM GOAL #4   Title  reduce R ankle edema by >/= 1 cm to assist with ROM and reduction of pain    Baseline  inital measure 57.6cm    Time  4    Period  Weeks    Status  On-going    Target Date  04/11/19        PT Long Term Goals - 03/14/19 1554      PT LONG TERM GOAL #1   Title  increase R ankle ROM WFL compared bil with no report of pain or limitation for functional and efficient gait    Time  8    Period  Weeks    Status  New    Target Date  05/09/19      PT LONG TERM GOAL #2   Title  pt to increase gross R ankle strength to >/=4+/5 to maximize ankle stability    Time  8    Period  Weeks    Status  New    Target Date  05/09/19      PT LONG TERM GOAL #3   Title  pt to be able to walk/ stand >/= 30 min and navigate up/ down >/= 12 steps reciprocally with no limitation or pain    Time  8    Period  Weeks    Status  New    Target Date  05/09/19      PT LONG TERM GOAL #4   Title  increase FOTO score to </=43% limited to demo improvement in function    Time  8    Period  Weeks    Status  New    Target Date  05/09/19      PT LONG TERM GOAL #5   Title  pt to be I with all HEP given  as of last visit to maintain and progress current level of function    Time  8    Period  Weeks    Status  New    Target Date  05/09/19            Plan - 03/22/19 1526    Clinical Impression Statement  Patient tolerated treatment well. he is concerned byt the swelling and pain. He was assured that as he progreses out of the boot swelling and pain are expected but should imporve. Patient worked on standing exercises that should help his gait now that he is out of the boot. he has increased toe out on the right bt can correc with cuing for standing ther-ex. he was given an updated HEP for home. his DF improved significantly with manual therapy. He was advised to maintain the motion at home but he does not have to be very agressive with his stretching.     Comorbidities  hx of anxiety    Examination-Activity Limitations  Locomotion Level;Lift;Stand;Stairs    Stability/Clinical Decision Making  Evolving/Moderate complexity    Clinical Decision Making  Moderate    Rehab Potential  Good    PT Frequency  2x / week    PT Duration  8 weeks    PT Treatment/Interventions  ADLs/Self Care Home Management;Cryotherapy;Electrical Stimulation;Iontophoresis 4mg /ml Dexamethasone;Moist Heat;Ultrasound;Stair training;Functional mobility training;Therapeutic activities;Therapeutic exercise;Balance training;Neuromuscular re-education;Manual techniques;Passive range of motion;Vasopneumatic Device;Taping    PT Next Visit Plan  review/ update HEP PRN, Ankle ROM, edema reduction, weight shifting, ankle strengthening/ baps. pain education    PT Home Exercise Plan  [id number] -  lateral weight shifting, forward weight shifting, ankle abcs, heel slide, seated heel/ toe raise    Consulted and Agree with Plan of Care  Patient       Patient will benefit from skilled therapeutic intervention in order to improve the following deficits and impairments:  Improper body mechanics, Postural dysfunction, Abnormal gait, Pain, Decreased balance, Decreased endurance, Decreased strength, Decreased range of motion, Decreased activity tolerance, Increased edema  Visit Diagnosis: Pain in right ankle and joints of right foot  Muscle weakness (generalized)  Localized edema  Stiffness of right ankle, not elsewhere classified     Problem List Patient Active Problem List   Diagnosis Date Noted  . Closed fracture of base of fifth metatarsal bone of right foot at metaphyseal-diaphyseal junction 12/30/2018  . Schizoaffective disorder, depressive type ([place])   . Schizophrenia ([place]) 03/09/2018  . Schizophrenia, paranoid type ([place]) 10/11/2017  . Overdose 03/05/2015  . Seizures ([place])   . Tachycardia   . Right shoulder pain     Carney Living PT DPT  03/23/2019, 12:54 PM  [facility] [address] [place], [place], [address] Phone: [phone]   Fax:  [phone]  Name: HIATT DASCHER MRN: [id number] Date of Birth: 13-Mar-1981

## 2019-03-28 ENCOUNTER — Other Ambulatory Visit: Payer: Self-pay

## 2019-03-28 ENCOUNTER — Encounter: Payer: Self-pay | Admitting: Physical Therapy

## 2019-03-28 ENCOUNTER — Ambulatory Visit: Payer: Medicare Other | Admitting: Physical Therapy

## 2019-03-28 DIAGNOSIS — M6281 Muscle weakness (generalized): Secondary | ICD-10-CM

## 2019-03-28 DIAGNOSIS — M25671 Stiffness of right ankle, not elsewhere classified: Secondary | ICD-10-CM

## 2019-03-28 DIAGNOSIS — M25571 Pain in right ankle and joints of right foot: Secondary | ICD-10-CM

## 2019-03-28 DIAGNOSIS — R600 Localized edema: Secondary | ICD-10-CM

## 2019-03-28 NOTE — Therapy (Signed)
[name], [place], [address] Phone: [phone]   Fax:  [phone]  Physical Therapy Treatment  Patient Details  Name: [name] MRN: [id number] Date of Birth: 11-29-1980 Referring Provider (PT): Aundra Dubin, [place]    Encounter Date: 03/28/2019  PT End of Session - 03/28/19 1552    Visit Number  4    Number of Visits  17    Date for PT Re-Evaluation  05/09/19    Authorization Type  MCR: Kx at 15th visit, progress note at 10th visit    PT Start Time  1552   pt arrived 7 min late today   PT Stop Time  1630    PT Time Calculation (min)  38 min    Activity Tolerance  Patient tolerated treatment well    Behavior During Therapy  [facility] for tasks assessed/performed       Past Medical History:  Diagnosis Date  . Anxiety   . Headache   . History of suicidal ideation   . Schizophrenia [facility])     Past Surgical History:  Procedure Laterality Date  . APPENDECTOMY    . ORIF TOE FRACTURE Right 01/04/2019   Procedure: OPEN REDUCTION INTERNAL FIXATION (ORIF) RIGHT 5TH METATARSAL FRACTURE;  Surgeon: Leandrew Koyanagi, MD;  Location: [place];  Service: Orthopedics;  Laterality: Right;  . TIBIA FRACTURE SURGERY     right    There were no vitals filed for this visit.  Subjective Assessment - 03/28/19 1554    Subjective  " I've been wearing my shoes for about a week now. I have been elevating the ankle about 20-30 min.    Currently in Pain?  Yes    Pain Score  3     Pain Location  Ankle    Pain Orientation  Right    Pain Descriptors / Indicators  Aching    Pain Type  Surgical pain    Pain Onset  More than a month ago    Pain Frequency  Intermittent                       [facility] Adult PT Treatment/Exercise - 03/28/19 0001      Ankle Exercises: Aerobic   Nustep  L5 x 5 min LE only      Ankle Exercises: Supine   T-Band  4-way theraband strengthening 1 x 15 ea.      Ankle Exercises: Standing   Heel Raises  Both;15 reps    Toe Raise  15 reps    Other Standing Ankle Exercises  gait training foward/ retro in // x 5 ea, walking 2 x 180 ft using metronome 1 lap at 90BPM, 1 lap 100BPM   reduced antalgic pattern    Other Standing Ankle Exercises  heel raise/ toe raise 1 x 15      Ankle Exercises: Stretches   Gastroc Stretch  2 reps;30 seconds             PT Education - 03/28/19 1627    Education Details  updated HEP for 4 way ankle strengthening. proper gait training with heel strike/ toe off pattern to promote good form.    Person(s) Educated  Patient    Methods  Explanation;Verbal cues;Handout    Comprehension  Verbalized understanding;Verbal cues required       PT Short Term Goals - 03/22/19 1536      PT SHORT TERM GOAL #1  Title  pt to be I with inital HEP    Time  4    Period  Weeks    Status  New    Target Date  04/11/19      PT SHORT TERM GOAL #2   Title  increase R ankle DF to >/=6 degrees and PF to >/= 30 degrees to promote functional ROM    Time  4    Period  Weeks    Status  On-going    Target Date  04/11/19      PT SHORT TERM GOAL #3   Title  pt to be able to wean from the cam boot to >/= 80% throughout a 1 week period for progression of function    Time  4    Period  Weeks    Status  On-going    Target Date  04/11/19      PT SHORT TERM GOAL #4   Title  reduce R ankle edema by >/= 1 cm to assist with ROM and reduction of pain    Baseline  inital measure 57.6cm    Time  4    Period  Weeks    Status  On-going    Target Date  04/11/19        PT Long Term Goals - 03/14/19 1554      PT LONG TERM GOAL #1   Title  increase R ankle ROM WFL compared bil with no report of pain or limitation for functional and efficient gait    Time  8    Period  Weeks    Status  New    Target Date  05/09/19      PT LONG TERM GOAL #2   Title  pt to increase gross R ankle strength to >/=4+/5 to maximize ankle stability    Time  8     Period  Weeks    Status  New    Target Date  05/09/19      PT LONG TERM GOAL #3   Title  pt to be able to walk/ stand >/= 30 min and navigate up/ down >/= 12 steps reciprocally with no limitation or pain    Time  8    Period  Weeks    Status  New    Target Date  05/09/19      PT LONG TERM GOAL #4   Title  increase FOTO score to </=43% limited to demo improvement in function    Time  8    Period  Weeks    Status  New    Target Date  05/09/19      PT LONG TERM GOAL #5   Title  pt to be I with all HEP given as of last visit to maintain and progress current level of function    Time  8    Period  Weeks    Status  New    Target Date  05/09/19            Plan - 03/28/19 1629    Clinical Impression Statement  pt reports pain at 3/10 pain today and reports he has discontinued using the boot and resumed to wearing regular shoes. She continues to demontrate a significant antalgic gait pattern which he is able ot reduce with cues. pt continues to demonstrate apprehension regarding pain and damage related to pain. added ankle strengthening to his HEP, pt noted no changes in pain during or following session.  PT Treatment/Interventions  ADLs/Self Care Home Management;Cryotherapy;Electrical Stimulation;Iontophoresis 4mg /ml Dexamethasone;Moist Heat;Ultrasound;Stair training;Functional mobility training;Therapeutic activities;Therapeutic exercise;Balance training;Neuromuscular re-education;Manual techniques;Passive range of motion;Vasopneumatic Device;Taping    PT Next Visit Plan  review/ update HEP PRN, Ankle ROM, edema reduction, weight shifting, ankle strengthening/ baps. pain education    PT Home Exercise Plan  [id number] -  lateral weight shifting, forward weight shifting, ankle abcs, heel slide, seated heel/ toe raise    Consulted and Agree with Plan of Care  Patient       Patient will benefit from skilled therapeutic intervention in order to improve the following deficits and  impairments:  Improper body mechanics, Postural dysfunction, Abnormal gait, Pain, Decreased balance, Decreased endurance, Decreased strength, Decreased range of motion, Decreased activity tolerance, Increased edema  Visit Diagnosis: Pain in right ankle and joints of right foot  Muscle weakness (generalized)  Localized edema  Stiffness of right ankle, not elsewhere classified     Problem List Patient Active Problem List   Diagnosis Date Noted  . Closed fracture of base of fifth metatarsal bone of right foot at metaphyseal-diaphyseal junction 12/30/2018  . Schizoaffective disorder, depressive type ([place])   . Schizophrenia ([place]) 03/09/2018  . Schizophrenia, paranoid type ([place]) 10/11/2017  . Overdose 03/05/2015  . Seizures ([place])   . Tachycardia   . Right shoulder pain    Starr Lake PT, DPT, LAT, ATC  03/28/19  4:33 PM      [facility] [address] [place], [place], [address] Phone: [phone]   Fax:  [phone]  Name: CHARLS GROWNEY MRN: [id number] Date of Birth: [birth year]

## 2019-03-29 ENCOUNTER — Ambulatory Visit: Payer: Medicare Other | Admitting: Physical Therapy

## 2019-04-04 ENCOUNTER — Ambulatory Visit: Payer: Medicare Other | Admitting: Physical Therapy

## 2019-04-05 ENCOUNTER — Other Ambulatory Visit: Payer: Self-pay

## 2019-04-05 ENCOUNTER — Encounter: Payer: Self-pay | Admitting: Physical Therapy

## 2019-04-05 ENCOUNTER — Ambulatory Visit: Payer: Medicare Other | Admitting: Physical Therapy

## 2019-04-05 DIAGNOSIS — M25671 Stiffness of right ankle, not elsewhere classified: Secondary | ICD-10-CM

## 2019-04-05 DIAGNOSIS — R600 Localized edema: Secondary | ICD-10-CM

## 2019-04-05 DIAGNOSIS — M25571 Pain in right ankle and joints of right foot: Secondary | ICD-10-CM

## 2019-04-05 DIAGNOSIS — M6281 Muscle weakness (generalized): Secondary | ICD-10-CM

## 2019-04-05 NOTE — Therapy (Addendum)
[place], [place], [address] Phone: [phone]   Fax:  [phone]  Physical Therapy Treatment / Discharge  Patient Details  Name: [name] MRN: [id number] Date of Birth: [birth year] Referring Provider (PT): Aundra Dubin, [place]    Encounter Date: 04/05/2019  PT End of Session - 04/05/19 0904    Visit Number  5    Number of Visits  17    Date for PT Re-Evaluation  05/09/19    Authorization Type  MCR: Kx at 15th visit, progress note at 10th visit    PT Start Time  0848    PT Stop Time  0929    PT Time Calculation (min)  41 min    Activity Tolerance  Patient tolerated treatment well    Behavior During Therapy  [facility] for tasks assessed/performed       Past Medical History:  Diagnosis Date  . Anxiety   . Headache   . History of suicidal ideation   . Schizophrenia [facility])     Past Surgical History:  Procedure Laterality Date  . APPENDECTOMY    . ORIF TOE FRACTURE Right 01/04/2019   Procedure: OPEN REDUCTION INTERNAL FIXATION (ORIF) RIGHT 5TH METATARSAL FRACTURE;  Surgeon: Leandrew Koyanagi, MD;  Location: [place];  Service: Orthopedics;  Laterality: Right;  . TIBIA FRACTURE SURGERY     right    There were no vitals filed for this visit.  Subjective Assessment - 04/05/19 0851    Subjective  "I am doing much better, pain is at a 2/10. I am still working on the walking I still limp"    Patient Stated Goals  to get back to wearing shoes, return to regular ADLs, decrease pain,    Currently in Pain?  Yes    Pain Score  2     Pain Orientation  Right    Pain Descriptors / Indicators  Aching    Pain Onset  More than a month ago    Pain Frequency  Intermittent    Aggravating Factors   prolonged standing/ walking         [facility] PT Assessment - 04/05/19 0001      Assessment   Medical Diagnosis   Closed fracture of base of fifth metatarsal bone of right foot at  metaphyseal-diaphyseal junction, initial encounter     Referring Provider (PT)  Nathaniel Man                    [facility] Adult PT Treatment/Exercise - 04/05/19 0854      Knee/Hip Exercises: Standing   Step Down  Right;2 sets;10 reps   demonstration and verbal cues for proper form   Step Down Limitations  pt continued to utilizing forward trunk flexion to assist with activity      Ankle Exercises: Aerobic   Nustep  L5 x 7 min LE only      Ankle Exercises: Standing   Heel Raises  Both;15 reps   off edge of step   Toe Raise  15 reps    Other Standing Ankle Exercises  gait training foward/ retro in // x 5 ea, walking with trekking poles 6 x 50 ft      Ankle Exercises: Seated   BAPS  Level 2;Sitting   DF/PF and inversion/eversion, CCW/CW circles x 10 ea.     Ankle Exercises: Machines for Strengthening   Cybex Leg Press  2 x 15 30#  PT Short Term Goals - 03/22/19 1536      PT SHORT TERM GOAL #1   Title  pt to be I with inital HEP    Time  4    Period  Weeks    Status  New    Target Date  04/11/19      PT SHORT TERM GOAL #2   Title  increase R ankle DF to >/=6 degrees and PF to >/= 30 degrees to promote functional ROM    Time  4    Period  Weeks    Status  On-going    Target Date  04/11/19      PT SHORT TERM GOAL #3   Title  pt to be able to wean from the cam boot to >/= 80% throughout a 1 week period for progression of function    Time  4    Period  Weeks    Status  On-going    Target Date  04/11/19      PT SHORT TERM GOAL #4   Title  reduce R ankle edema by >/= 1 cm to assist with ROM and reduction of pain    Baseline  inital measure 57.6cm    Time  4    Period  Weeks    Status  On-going    Target Date  04/11/19        PT Long Term Goals - 03/14/19 1554      PT LONG TERM GOAL #1   Title  increase R ankle ROM WFL compared bil with no report of pain or limitation for functional and efficient gait    Time  8    Period   Weeks    Status  New    Target Date  05/09/19      PT LONG TERM GOAL #2   Title  pt to increase gross R ankle strength to >/=4+/5 to maximize ankle stability    Time  8    Period  Weeks    Status  New    Target Date  05/09/19      PT LONG TERM GOAL #3   Title  pt to be able to walk/ stand >/= 30 min and navigate up/ down >/= 12 steps reciprocally with no limitation or pain    Time  8    Period  Weeks    Status  New    Target Date  05/09/19      PT LONG TERM GOAL #4   Title  increase FOTO score to </=43% limited to demo improvement in function    Time  8    Period  Weeks    Status  New    Target Date  05/09/19      PT LONG TERM GOAL #5   Title  pt to be I with all HEP given as of last visit to maintain and progress current level of function    Time  8    Period  Weeks    Status  New    Target Date  05/09/19            Plan - 04/05/19 6294    Clinical Impression Statement  pt reports improvement in pain at 2/10 but conintues to exhibit a significant antalgic gait pattern. continued working gait training using trekking poles and gross RLE strengthening. He required frequent cues on gait mechanics and cues to avoid focusing onthe ankle. He does require frequent cues that pain isn't  causing damage due to pt apprehension to pain.    PT Treatment/Interventions  ADLs/Self Care Home Management;Cryotherapy;Electrical Stimulation;Iontophoresis 42m/ml Dexamethasone;Moist Heat;Ultrasound;Stair training;Functional mobility training;Therapeutic activities;Therapeutic exercise;Balance training;Neuromuscular re-education;Manual techniques;Passive range of motion;Vasopneumatic Device;Taping    PT Next Visit Plan  review/ update HEP PRN, Ankle ROM, edema reduction, weight shifting, ankle strengthening/ baps. pain education    PT Home Exercise Plan  [id number] -  lateral weight shifting, forward weight shifting, ankle abcs, heel slide, seated heel/ toe raise    Consulted and Agree with Plan of  Care  Patient       Patient will benefit from skilled therapeutic intervention in order to improve the following deficits and impairments:  Improper body mechanics, Postural dysfunction, Abnormal gait, Pain, Decreased balance, Decreased endurance, Decreased strength, Decreased range of motion, Decreased activity tolerance, Increased edema  Visit Diagnosis: Pain in right ankle and joints of right foot  Muscle weakness (generalized)  Localized edema  Stiffness of right ankle, not elsewhere classified     Problem List Patient Active Problem List   Diagnosis Date Noted  . Closed fracture of base of fifth metatarsal bone of right foot at metaphyseal-diaphyseal junction 12/30/2018  . Schizoaffective disorder, depressive type ([place]   . [place] ([place] 03/09/2018  . [place]izophrenia, paranoid type ([place] 10/11/2017  . Overdose 03/05/2015  . Seizures ([place]   . Tachycardia   . Right shoulder pain    Lloyd Cullinan PT, DPT, LAT, ATC  04/05/19  9:30 AM      [facility] [address]Phone: [phone]  Fax:  [phone] Name: AABDELRAHMAN NAIRMRN: [id number] of Birth: 603-06-1980     PHYSICAL THERAPY DISCHARGE SUMMARY  Visits from Start of Care: 5  Current functional level related to goals / functional outcomes: See goals   Remaining deficits: Current status unknown   Education / Equipment: HEP, theraband, posture  Plan: Patient agrees to discharge.  Patient goals were partially met. Patient is being discharged due to not returning since the last visit.  ?????        Farryn Linares PT, DPT, LAT, ATC  05/15/19  12:58 PM

## 2019-04-11 ENCOUNTER — Encounter: Payer: Self-pay | Admitting: Orthopaedic Surgery

## 2019-04-11 ENCOUNTER — Ambulatory Visit (INDEPENDENT_AMBULATORY_CARE_PROVIDER_SITE_OTHER): Payer: Medicare Other

## 2019-04-11 ENCOUNTER — Other Ambulatory Visit: Payer: Self-pay

## 2019-04-11 ENCOUNTER — Ambulatory Visit: Payer: Medicare Other | Admitting: Physical Therapy

## 2019-04-11 ENCOUNTER — Ambulatory Visit (INDEPENDENT_AMBULATORY_CARE_PROVIDER_SITE_OTHER): Payer: Medicare Other | Admitting: Orthopaedic Surgery

## 2019-04-11 DIAGNOSIS — S99191A Other physeal fracture of right metatarsal, initial encounter for closed fracture: Secondary | ICD-10-CM

## 2019-04-11 NOTE — Progress Notes (Signed)
   Post-Op Visit Note   Patient: [name]           Date of Birth: 09/24/1980           MRN: [id number] Visit Date: 04/11/2019 PCP: Patient, No Pcp Per   Assessment & Plan:  Chief Complaint:  Chief Complaint  Patient presents with  . Right Foot - Pain   Visit Diagnoses:  1. Closed fracture of base of fifth metatarsal bone of right foot at metaphyseal-diaphyseal junction, initial encounter     Plan: Patient is a pleasant 39 year old gentleman who presents our clinic today 3 months status post ORIF left foot fifth metatarsal fracture.  He has been doing well.  He notes some pain which he rates as a 1-2 out of 10.  Overall he is doing well.  Examination of his left foot reveals no swelling.  No tenderness at fracture site.  He is neurovascular intact distally.  At this point, he will advance with activity as tolerated.  He will follow-up with [place] as needed.  Follow-Up Instructions: Return if symptoms worsen or fail to improve.   Orders:  Orders Placed This Encounter  Procedures  . XR Foot Complete Right   No orders of the defined types were placed in this encounter.   Imaging: XR Foot Complete Right  Result Date: 04/11/2019 X-rays demonstrate a healed fifth metatarsal fracture   PMFS History: Patient Active Problem List   Diagnosis Date Noted  . Closed fracture of base of fifth metatarsal bone of right foot at metaphyseal-diaphyseal junction 12/30/2018  . Schizoaffective disorder, depressive type ([place])   . Schizophrenia ([place]) 03/09/2018  . Schizophrenia, paranoid type ([place]) 10/11/2017  . Overdose 03/05/2015  . Seizures ([place])   . Tachycardia   . Right shoulder pain    Past Medical History:  Diagnosis Date  . Anxiety   . Headache   . History of suicidal ideation   . Schizophrenia ([place])     History reviewed. No pertinent family history.  Past Surgical History:  Procedure Laterality Date  . APPENDECTOMY    . ORIF TOE FRACTURE Right 01/04/2019   Procedure: OPEN  REDUCTION INTERNAL FIXATION (ORIF) RIGHT 5TH METATARSAL FRACTURE;  Surgeon: Leandrew Koyanagi, MD;  Location: [facility];  Service: Orthopedics;  Laterality: Right;  . TIBIA FRACTURE SURGERY     right   Social History   Occupational History  . Not on file  Tobacco Use  . Smoking status: Current Every Day Smoker    Types: Cigarettes  . Smokeless tobacco: Never Used  Substance and Sexual Activity  . Alcohol use: Yes    Comment: weekly  . Drug use: Yes    Types: Marijuana    Comment: hx of crack use also  . Sexual activity: Yes

## 2019-04-12 ENCOUNTER — Ambulatory Visit: Payer: Medicare Other | Attending: Physician Assistant | Admitting: Physical Therapy

## 2019-04-12 ENCOUNTER — Telehealth: Payer: Self-pay | Admitting: Physical Therapy

## 2019-04-12 NOTE — Telephone Encounter (Signed)
Left voicemail regarding no-show to appointment this morning. Requested he call to reschedule if he would like to continue PT.

## 2019-12-12 ENCOUNTER — Emergency Department (HOSPITAL_COMMUNITY)
Admission: EM | Admit: 2019-12-12 | Discharge: 2019-12-13 | Disposition: A | Discharge status: Home / Self Care | Payer: 59 | Attending: Emergency Medicine | Admitting: Emergency Medicine

## 2019-12-12 ENCOUNTER — Emergency Department (HOSPITAL_COMMUNITY): Payer: 59

## 2019-12-12 ENCOUNTER — Encounter (HOSPITAL_COMMUNITY): Payer: Self-pay

## 2019-12-12 ENCOUNTER — Other Ambulatory Visit: Payer: Self-pay

## 2019-12-12 DIAGNOSIS — Z20822 Contact with and (suspected) exposure to covid-19: Secondary | ICD-10-CM | POA: Diagnosis not present

## 2019-12-12 DIAGNOSIS — F17210 Nicotine dependence, cigarettes, uncomplicated: Secondary | ICD-10-CM | POA: Diagnosis not present

## 2019-12-12 DIAGNOSIS — F251 Schizoaffective disorder, depressive type: Secondary | ICD-10-CM | POA: Insufficient documentation

## 2019-12-12 DIAGNOSIS — R451 Restlessness and agitation: Secondary | ICD-10-CM | POA: Diagnosis present

## 2019-12-12 DIAGNOSIS — Z79899 Other long term (current) drug therapy: Secondary | ICD-10-CM | POA: Diagnosis not present

## 2019-12-12 DIAGNOSIS — R519 Headache, unspecified: Secondary | ICD-10-CM | POA: Diagnosis not present

## 2019-12-12 DIAGNOSIS — Z7982 Long term (current) use of aspirin: Secondary | ICD-10-CM | POA: Insufficient documentation

## 2019-12-12 DIAGNOSIS — F29 Unspecified psychosis not due to a substance or known physiological condition: Secondary | ICD-10-CM

## 2019-12-12 DIAGNOSIS — R45851 Suicidal ideations: Secondary | ICD-10-CM | POA: Insufficient documentation

## 2019-12-12 LAB — COMPREHENSIVE METABOLIC PANEL
ALT: 20 U/L (ref 0–44)
AST: 40 U/L (ref 15–41)
Albumin: 4.7 g/dL (ref 3.5–5.0)
Alkaline Phosphatase: 90 U/L (ref 38–126)
Anion gap: 12 (ref 5–15)
BUN: 9 mg/dL (ref 6–20)
CO2: 23 mmol/L (ref 22–32)
Calcium: 9.8 mg/dL (ref 8.9–10.3)
Chloride: 99 mmol/L (ref 98–111)
Creatinine, Ser: 0.89 mg/dL (ref 0.61–1.24)
GFR calc Af Amer: >60 mL/min (ref >60)
GFR calc non Af Amer: >60 mL/min (ref >60)
Glucose, Bld: 99 mg/dL (ref 70–99)
Potassium: 4.4 mmol/L (ref 3.5–5.1)
Sodium: 134 mmol/L — ABNORMAL LOW (ref 135–145)
Total Bilirubin: 0.8 mg/dL (ref 0.3–1.2)
Total Protein: 8.1 g/dL (ref 6.5–8.1)

## 2019-12-12 LAB — I-STAT CHEM 8, ED
BUN: 10 mg/dL (ref 6–20)
Calcium, Ion: 1.19 mmol/L (ref 1.15–1.40)
Chloride: 101 mmol/L (ref 98–111)
Creatinine, Ser: 0.8 mg/dL (ref 0.61–1.24)
Glucose, Bld: 94 mg/dL (ref 70–99)
HCT: 50 % (ref 39.0–52.0)
Hemoglobin: 17 g/dL (ref 13.0–17.0)
Potassium: 5.7 mmol/L — ABNORMAL HIGH (ref 3.5–5.1)
Sodium: 134 mmol/L — ABNORMAL LOW (ref 135–145)
TCO2: 25 mmol/L (ref 22–32)

## 2019-12-12 LAB — CBC WITH DIFFERENTIAL/PLATELET
Abs Immature Granulocytes: 0.14 10*3/uL — ABNORMAL HIGH (ref 0.00–0.07)
Basophils Absolute: 0.1 10*3/uL (ref 0.0–0.1)
Basophils Relative: 0 %
Eosinophils Absolute: 0 10*3/uL (ref 0.0–0.5)
Eosinophils Relative: 0 %
HCT: 47.8 % (ref 39.0–52.0)
Hemoglobin: 16.8 g/dL (ref 13.0–17.0)
Immature Granulocytes: 1 %
Lymphocytes Relative: 4 %
Lymphs Abs: 1 10*3/uL (ref 0.7–4.0)
MCH: 31.8 pg (ref 26.0–34.0)
MCHC: 35.1 g/dL (ref 30.0–36.0)
MCV: 90.4 fL (ref 80.0–100.0)
Monocytes Absolute: 1.7 10*3/uL — ABNORMAL HIGH (ref 0.1–1.0)
Monocytes Relative: 7 %
Neutro Abs: 20.7 10*3/uL — ABNORMAL HIGH (ref 1.7–7.7)
Neutrophils Relative %: 88 %
Platelets: 245 10*3/uL (ref 150–400)
RBC: 5.29 MIL/uL (ref 4.22–5.81)
RDW: 14.5 % (ref 11.5–15.5)
WBC: 23.6 10*3/uL — ABNORMAL HIGH (ref 4.0–10.5)
nRBC: 0 % (ref 0.0–0.2)

## 2019-12-12 LAB — ETHANOL: Alcohol, Ethyl (B): <10 mg/dL (ref <10)

## 2019-12-12 LAB — CBG MONITORING, ED: Glucose-Capillary: 102 mg/dL — ABNORMAL HIGH (ref 70–99)

## 2019-12-12 LAB — ACETAMINOPHEN LEVEL: Acetaminophen (Tylenol), Serum: <10 ug/mL — ABNORMAL LOW (ref 10–30)

## 2019-12-12 LAB — SALICYLATE LEVEL: Salicylate Lvl: <7 mg/dL — ABNORMAL LOW (ref 7.0–30.0)

## 2019-12-12 LAB — AMMONIA: Ammonia: 57 umol/L — ABNORMAL HIGH (ref 9–35)

## 2019-12-12 NOTE — ED Triage Notes (Signed)
Pt presents by [facility] for evaluation of possible drug use and psychiatric evaluation. EMS reports pt was combative on scene and was given 5mg  Midazolam IM and 5mg  Haldol IM. 20ga Left Forearm. Pulse104, BP 112/72,SPO2 96, CBG 180. EMS reports pt was found  down on ground and some bleeding from nose. C-collar was applied by EMS. Pt now calm at this time.

## 2019-12-12 NOTE — ED Provider Notes (Signed)
[facility] DEPT Provider Note   CSN: [id number] Arrival date & time: 12/12/19  2007     History Chief Complaint  Patient presents with  . Psychiatric Evaluation    [name] is a 39 y.o. male who  has a past medical history of Anxiety, Headache, History of suicidal ideation, and Schizophrenia ([place]). The patient was bib EMS for acute agitation. H history is gathered from EMR, EMS at bedside.  The paramedic states that they were called out to the scene by the patient's mother.  He had apparently been snorting a white substance earlier today.  Unsure if he is taking his schizophrenia medications.  She states that the mother called because the patient was acting violently, angrily erratically.  He was speaking nonsensically, appeared to be hallucinating.  EMS reports that they were unable to have any meaningful conversation or reason with the patient and eventually had to call police to help bring the patient down so they could chemically sedate him with Haldol.  The EMS reports that she did notice that his nose was bleeding however there was no reported trauma prior to this occurring.  He has a known history of schizophrenia and polysubstance abuse.  He has had previous psychiatric hospitalizations.  There is a level 5 caveat due to psychosis and the patient is currently sedated and unable to answer questions.   HPI     Past Medical History:  Diagnosis Date  . Anxiety   . Headache   . History of suicidal ideation   . Schizophrenia [facility])     Patient Active Problem List   Diagnosis Date Noted  . Closed fracture of base of fifth metatarsal bone of right foot at metaphyseal-diaphyseal junction 12/30/2018  . Schizoaffective disorder, depressive type ([place])   . Schizophrenia ([place]) 03/09/2018  . Schizophrenia, paranoid type ([place]) 10/11/2017  . Overdose 03/05/2015  . Seizures ([place])   . Tachycardia   . Right shoulder pain     Past Surgical History:    Procedure Laterality Date  . APPENDECTOMY    . ORIF TOE FRACTURE Right 01/04/2019   Procedure: OPEN REDUCTION INTERNAL FIXATION (ORIF) RIGHT 5TH METATARSAL FRACTURE;  Surgeon: Leandrew Koyanagi, MD;  Location: [facility];  Service: Orthopedics;  Laterality: Right;  . TIBIA FRACTURE SURGERY     right       No family history on file.  Social History   Tobacco Use  . Smoking status: Current Every Day Smoker    Types: Cigarettes  . Smokeless tobacco: Never Used  Vaping Use  . Vaping Use: Never used  Substance Use Topics  . Alcohol use: Yes    Comment: weekly  . Drug use: Yes    Types: Marijuana    Comment: hx of crack use also    Home Medications Prior to Admission medications   Medication Sig Start Date End Date Taking? Authorizing Provider  acetaminophen (TYLENOL) 325 MG tablet Take 650 mg by mouth every 6 (six) hours as needed.    [provider]  aspirin EC 81 MG tablet Take 1 tablet (81 mg total) by mouth 2 (two) times daily. Patient not taking: Reported on 03/14/2019 01/04/19   Leandrew Koyanagi, MD  ondansetron (ZOFRAN) 4 MG tablet Take 1-2 tablets (4-8 mg total) by mouth every 8 (eight) hours as needed for nausea or vomiting. Patient not taking: Reported on 03/14/2019 01/04/19   Leandrew Koyanagi, MD  Paliperidone Palmitate (INVEGA TRINZA IM) Inject into  the muscle.    [provider]  traMADol (ULTRAM) 50 MG tablet Take 1 tablet (50 mg total) by mouth 3 (three) times daily as needed. Patient not taking: Reported on 03/14/2019 03/07/19   Aundra Dubin, PA-C    Allergies    Patient has no known allergies.  Review of Systems   Review of Systems  All other systems reviewed and are negative.    Physical Exam Updated Vital Signs BP 112/78 (BP Location: Right Arm)   Pulse 93   Temp (!) 97.5 F (36.4 C) (Oral)   Resp 15   SpO2 93%   Physical Exam Vitals and nursing note reviewed.  Constitutional:      General: He is sleeping. He is not in  acute distress.    Appearance: He is well-developed. He is not diaphoretic.  HENT:     Head: Normocephalic and atraumatic.  Eyes:     General: No scleral icterus.    Conjunctiva/sclera: Conjunctivae normal.  Cardiovascular:     Rate and Rhythm: Normal rate and regular rhythm.     Heart sounds: Normal heart sounds.  Pulmonary:     Effort: Pulmonary effort is normal. No respiratory distress.     Breath sounds: Normal breath sounds.  Abdominal:     Palpations: Abdomen is soft.     Tenderness: There is no abdominal tenderness.  Musculoskeletal:     Cervical back: Normal range of motion and neck supple.  Skin:    General: Skin is warm and dry.  Psychiatric:        Behavior: Behavior normal.     ED Results / Procedures / Treatments   Labs (all labs ordered are listed, but only abnormal results are displayed) Labs Reviewed - No data to display  EKG None  Radiology No results found.  Procedures Procedures (including critical care time)  Medications Ordered in ED Medications - No data to display  ED Course  I have reviewed the triage vital signs and the nursing notes.  Pertinent labs & imaging results that were available during my care of the patient were reviewed by me and considered in my medical decision making (see chart for details).    MDM Rules/Calculators/A&P                          Patient is currently under IVC. Reviewed CT images of the patient's head, C-spine and max of facial which are all negative for acute abnormality.work-up is otherwise currently pending. I have given signout to [place] will assume care of the patient for further evaluation. He will need psychiatric evaluation. Final Clinical Impression(s) / ED Diagnoses Final diagnoses:  None    Rx / DC Orders ED Discharge Orders    None       Margarita Mail, PA-C 12/12/19 2200    Davonna Belling, MD 12/12/19 2233

## 2019-12-13 DIAGNOSIS — F251 Schizoaffective disorder, depressive type: Secondary | ICD-10-CM | POA: Diagnosis not present

## 2019-12-13 LAB — SARS CORONAVIRUS 2 BY RT PCR (HOSPITAL ORDER, PERFORMED IN ~~LOC~~ HOSPITAL LAB): SARS Coronavirus 2: NEGATIVE

## 2019-12-13 NOTE — Progress Notes (Signed)
ED RN spoke to brother and pt confident he can take bus. Pt was provided a bus pass. [place], [place] ED TOC CM [phone]

## 2019-12-13 NOTE — ED Notes (Signed)
Pt discharged home. Discharged instructions read to pt who verbalized understanding. All belongings returned to pt. Denies SI/HI, is not delusional and not responding to internal stimuli. Escorted pt to the ED exit.   Pt was pleasant, clam, cooperative and appropriate. This writer walked him to within sight of the bus stop. He said that he has ridden the bus many times and get home with no difficulty.

## 2019-12-13 NOTE — ED Notes (Signed)
Pt's guardian brother is not answering the telephone [phone]). As planned pt is discharged. Have asked SW to get a taxi voucher for pt because his brother does not think that pt can navigate the bus system.

## 2019-12-13 NOTE — BH Assessment (Addendum)
Assessment Note  [name] is an 39 y.o. male. Patient presents to [facility] via [facility]/paramedics for evaluation of psychosis. Per ED notes: "The paramedic states that they were called out to the scene by the patient's mother.  He had apparently been snorting a white substance earlier today.  Unsure if he is taking his schizophrenia medications.  She states that the mother called because the patient was acting violently, angrily erratically.  He was speaking nonsensically, appeared to be hallucinating.  EMS reports that they were unable to have any meaningful conversation or reason with the patient and eventually had to call police to help bring the patient down so they could chemically sedate him with Haldol.  IVC'd upon arrive to the ED given his acting violent and erratic".  Clinician completed a TTS assessment with patient today. He was calm and cooperative. States that he was brought to the ED via. EMS which was called by his mother. States that he lives with his mother. She came in his room this morning to check on him. Patient says that his mother saw that he was getting high, got scared, called his brother, and then then 911. Patient refused to provide any details on what he was using or his history of substance use. States, "I don't want to talk about it" and "I can't say even say it". Patient asked to moved to the next subject as he seemed uncomfortable about discussing his drug use.   Patient denies SI. He denies a history of suicidal ideations. However, upon chart review of medical history he has a history of suicidal ideations. Denies history of suicide attempts and/or gestures. Denies depressive symptoms and states he has not felt depressed in the past 2 yrs. Denies stress. Appetite is fair. He sleeps 8-9 hrs per day.  Denies a family history of mental health illnesses. No HI. Denies history of aggression. Denies having any legal issues. Denies current AVH's. Patient has a history of AVH's but  denies related symptoms in the past 1 1/2 to 2 yrs.   Patient lives with his mother. He is unemployed. Support system is his mother. He denies history of inpatient psychiatric treatment. He has a Teacher, music at [employer] that prescribes him medications. No therapist.   Patient is alert and oriented to person, place, time, and situation. Speech is normal. Affect is anxious. He is dressed in scrubs. Insight and judgement appear fair. Impulse control is fair.    Diagnosis: Schizophrenia   Past Medical History:  Past Medical History:  Diagnosis Date  . Anxiety   . Headache   . History of suicidal ideation   . Schizophrenia [facility])     Past Surgical History:  Procedure Laterality Date  . APPENDECTOMY    . ORIF TOE FRACTURE Right 01/04/2019   Procedure: OPEN REDUCTION INTERNAL FIXATION (ORIF) RIGHT 5TH METATARSAL FRACTURE;  Surgeon: Leandrew Koyanagi, MD;  Location: [facility];  Service: Orthopedics;  Laterality: Right;  . TIBIA FRACTURE SURGERY     right    Family History: No family history on file.  Social History:  reports that he has been smoking cigarettes. He has never used smokeless tobacco. He reports current alcohol use. He reports current drug use. Drug: Marijuana.  Additional Social History:  Alcohol / Drug Use Withdrawal Symptoms: Tremors, Nausea / Vomiting, Patient aware of relationship between substance abuse and physical/medical complications  CIWA: CIWA-Ar BP: 136/83 Pulse Rate: 85 COWS:    Allergies: No Known Allergies  Home Medications: (  Not in a hospital admission)   OB/GYN Status:  No LMP for male patient.  General Assessment Data Location of Assessment: [facility] ED TTS Assessment: In system Is this a Tele or Face-to-Face Assessment?: Face-to-Face Is this an Initial Assessment or a Re-assessment for this encounter?: Initial Assessment Patient Accompanied by:: N/A Language Other than English: No Living Arrangements:  (mom is in the home ) What  gender do you identify as?: Male Date Telepsych consult ordered in [facility]:  (12/13/2019) Time Telepsych consult ordered in [facility]:  (n/a) Marital status: Single Maiden name:  (n/a) Pregnancy Status: No Living Arrangements: Other relatives, Parent Can pt return to current living arrangement?: Yes Admission Status: Voluntary Is patient capable of signing voluntary admission?: Yes Referral Source: Self/Family/Friend  Medical Screening Exam ([place]) Medical Exam completed: No  Crisis Care Plan Living Arrangements: Other relatives, Parent Legal Guardian:  (no legal guardian ) Name of Psychiatrist:  Consulting civil engineer ) Name of Therapist:  (no therapist )  Education Status Is patient currently in school?: No  Risk to self with the past 6 months Suicidal Ideation: No Has patient been a risk to self within the past 6 months prior to admission? : No Suicidal Intent: No Has patient had any suicidal intent within the past 6 months prior to admission? : No Is patient at risk for suicide?: No Suicidal Plan?: No Has patient had any suicidal plan within the past 6 months prior to admission? : No Access to Means: No Previous Attempts/Gestures: No How many times?:  (denies ) Other Self Harm Risks:  (denies ) Triggers for Past Attempts: Other (Comment) (denies ) Intentional Self Injurious Behavior: None Family Suicide History:  (denies ) Recent stressful life event(s): Other (Comment) (denies ) Persecutory voices/beliefs?: No Depression: No Depression Symptoms:  ("It's been a long time since I've been depressed") Substance abuse history and/or treatment for substance abuse?: No Suicide prevention information given to non-admitted patients: Not applicable  Risk to Others within the past 6 months Homicidal Ideation: No Does patient have any lifetime risk of violence toward others beyond the six months prior to admission? : No Thoughts of Harm to Others: No Current Homicidal Intent: No Current  Homicidal Plan: No Access to Homicidal Means: No Identified Victim:  (n/a) History of harm to others?: No Assessment of Violence: None Noted Violent Behavior Description:  (Currently calm and cooperative ) Does patient have access to weapons?: No Criminal Charges Pending?: No Does patient have a court date: No Is patient on probation?: No  Psychosis Hallucinations: None noted ("That stopped long time ago...1 1/2 to 2 yrs ago ) Delusions: None noted  Mental Status Report Appearance/Hygiene: In scrubs Eye Contact: Good Motor Activity: Unremarkable Speech: Logical/coherent Level of Consciousness: Alert Mood: Anxious Affect: Appropriate to circumstance Anxiety Level: None Thought Processes: Relevant Judgement: Impaired Orientation: Person, Place, Time, Situation Obsessive Compulsive Thoughts/Behaviors: None  Cognitive Functioning Concentration: Decreased Memory: Recent Intact, Remote Intact Is patient IDD: No Insight: Fair Impulse Control: Fair Appetite: Fair Have you had any weight changes? : No Change Sleep: No Change Total Hours of Sleep:  (8-9 hrs per night ) Vegetative Symptoms: None  ADLScreening [facility] Assessment Services) Patient's cognitive ability adequate to safely complete daily activities?: Yes Patient able to express need for assistance with ADLs?: Yes Independently performs ADLs?: Yes (appropriate for developmental age)  Prior Inpatient Therapy Prior Inpatient Therapy: No Prior Therapy Dates:  (n/a) Prior Therapy Facilty/Provider(s):  (denies ) Reason for Treatment:  (n/A)  Prior Outpatient Therapy Prior  Outpatient Therapy: Yes Prior Therapy Dates:  ("I just go my shot-Invega 3-4 weeks ago at [facility]") Prior Therapy Facilty/Provider(s):  Consulting civil engineer ) Reason for Treatment:  (Schizophrenia ) Does patient have an ACCT team?: No Does patient have Intensive In-House Services?  : No Does patient have [facility] services? : No Does patient have P4CC services?:  No  ADL Screening (condition at time of admission) Patient's cognitive ability adequate to safely complete daily activities?: Yes Patient able to express need for assistance with ADLs?: Yes Independently performs ADLs?: Yes (appropriate for developmental age)       Abuse/Neglect Assessment (Assessment to be complete while patient is alone) Physical Abuse: Denies Verbal Abuse: Denies Sexual Abuse: Denies Self-Neglect: Denies     Regulatory affairs officer (For Healthcare) Does Patient Have a Medical Advance Directive?: No Nutrition Screen- [place] Adult/[facility]/AP Patient's home diet: Regular        Disposition: Per Shuvon Rankin, NP, patient is psych cleared. He was recommended to follow up with psychiatric medication management with [facility],  February 06, 2020 (2p-2:30p). Patient also in need of substance abuse referrals and will given a list of those services prior to his discharge from [facility].  Disposition Initial Assessment Completed for this Encounter: Yes Patient referred to: Other (Comment) Consulting civil engineer for med management; SA programs )  On Site Evaluation by:   Reviewed with Physician:    Waldon Merl 12/13/2019 12:23 PM

## 2019-12-13 NOTE — BHH Counselor (Signed)
  TTS attemtpted to complete assessment, pt would not wake up, pt not alert to complete assessment at this time. Per MD, pt was given Haldol injection earlier.

## 2019-12-13 NOTE — Discharge Instructions (Addendum)
For your behavioral health needs, you are advised to continue treatment with [facility].  Your next scheduled appointment is Tuesday, January 30, 2020 at 1:45 pm:       [address] [place], [place] [address]      [phone]  To help you maintain a sober lifestyle, a substance abuse treatment program may be beneficial to you.  The providers listed below offer Chemical Dependency Intensive Outpatient Programs.  These programs meet several hours a day, several times a week.  Contact them at your earliest opportunity to ask about enrolling:       [facility]. [address] [place], [place] [address]      [phone]      Contact person: Micheline Chapman, LCSW       [facility]      [address] [place], [place] [address]      [phone]

## 2019-12-13 NOTE — ED Notes (Signed)
Pt called his guardian and this Probation officer also spoke with his guardian again.  His guardian said that it is ok for pt to ride the bus if that is what he wants to do. This Probation officer will allow pt to ride the bus.

## 2019-12-13 NOTE — ED Provider Notes (Signed)
1:50 AM Patient care assumed from [place], PA-C at shift change. Patient brought in by EMS for evaluation of psychosis. Required Haldol in route for sedation and safety. IVC'd by prior providers given his acting violent and erratic. Also reported polysubstance abuse. Stress of substance use/acute intoxication could account for the patient's leukocytosis; may also be related to delirium. Patient appears to have a degree of leukocytosis chronically based on chart review.   Patient has requested to RN to speak to a provider about leaving; however, he is unable to leave due to IVC completion. Consult placed to TTS for assessment. Disposition to be determined by oncoming provider.   Antonietta Breach, PA-C 12/13/19 0448    Orpah Greek, MD 12/15/19 [phone]

## 2019-12-13 NOTE — BH Assessment (Addendum)
[facility] Assessment Progress Note  Per Shuvon Rankin, NP, this pt does not require psychiatric hospitalization at this time.  Pt presents under IVC initiated by EDP Davonna Belling, MD which has been rescinded by Hampton Abbot, MD.  Pt reportedly has an appointment scheduled with [facility], his regular outpatient provider, on Tuesday, 02/06/2020 at 14:00.  This has been included in pt's discharge instructions, along with information about area substance abuse treatment programs.  Pt's nurse, Diane, has been notified.  Pt's brother, Romolo Sieling [phone]), is his legal guardian.  At 12:22 this Probation officer called him at the phone number of record, which differed from the number listed above; I was unable to reach him or to leave a message.  He called from the correct number at 12:33, however, and I informed him of pt's disposition.  Registration has been informed of this change and I have updated it in the [facility].  Jalene Mullet, MA Triage Specialist [phone]   Addendum:  At 13:50 Hulan Amato calls back from [place], leaving a voice message for this Probation officer.  She reports that pt's next appointment is scheduled for Tuesday, 01/30/2020 at 13:45.  Discharge instructions have been updated to reflect this.  Diane has been notified.  Jalene Mullet, [place] Coordinator [phone]

## 2019-12-13 NOTE — ED Notes (Signed)
[place], [employer], called and said that he could not readily provide transportation. Informed Mr. Corpus that pt will be transported by taxi to the address on the IVC paperwork. [address], [place], [place]. Mr. Ehrler acknowledged and agreed to the plan.  SW is involved to provide the taxi voucher.

## 2020-02-22 ENCOUNTER — Ambulatory Visit: Payer: Self-pay

## 2020-02-22 ENCOUNTER — Other Ambulatory Visit: Payer: Self-pay

## 2020-02-22 ENCOUNTER — Encounter: Payer: Self-pay | Admitting: Orthopaedic Surgery

## 2020-02-22 ENCOUNTER — Ambulatory Visit (INDEPENDENT_AMBULATORY_CARE_PROVIDER_SITE_OTHER): Payer: 59 | Admitting: Orthopaedic Surgery

## 2020-02-22 DIAGNOSIS — S99191A Other physeal fracture of right metatarsal, initial encounter for closed fracture: Secondary | ICD-10-CM

## 2020-02-22 DIAGNOSIS — T8484XA Pain due to internal orthopedic prosthetic devices, implants and grafts, initial encounter: Secondary | ICD-10-CM

## 2020-02-22 DIAGNOSIS — G8929 Other chronic pain: Secondary | ICD-10-CM

## 2020-02-22 DIAGNOSIS — M25511 Pain in right shoulder: Secondary | ICD-10-CM | POA: Diagnosis not present

## 2020-02-22 NOTE — Progress Notes (Signed)
Office Visit Note   Patient: [name]           Date of Birth: Sep 16, 1980           MRN: [id number] Visit Date: 02/22/2020              Requested by: No referring provider defined for this encounter. PCP: Patient, No Pcp Per   Assessment & Plan: Visit Diagnoses:  1. Chronic right shoulder pain   2. Closed fracture of base of fifth metatarsal bone of right foot at metaphyseal-diaphyseal junction, initial encounter   3. Painful orthopaedic hardware [facility])     Plan: Impression is symptomatic hardware on the right foot status post fixation of Jones fracture as well as right shoulder pain and mainly subjective instability.  In terms of the right foot I think that he is symptomatic from the screw head and this may be eroding into the cuboid.  I have recommended screw removal as an option to improve the pain.  He understands of the risks of potentially incomplete relief of pain.  In terms of the right shoulder I offered a glenohumeral injection as well as outpatient physical therapy sounds like overall symptoms are manageable and he has not had any true episodes of dislocation and so he would like to just hold off for now.  He will be taking care of his mother for her upcoming knee replacement I am performing this coming Monday.  The patient will reach out to [place] when he is ready to schedule surgery for the hardware removal.  Follow-Up Instructions: Return if symptoms worsen or fail to improve.   Orders:  Orders Placed This Encounter  Procedures  . XR Shoulder Right  . XR Foot Complete Right   No orders of the defined types were placed in this encounter.     Procedures: No procedures performed   Clinical Data: No additional findings.   Subjective: Chief Complaint  Patient presents with  . Left Shoulder - Pain  . Right Shoulder - Pain  . Right Foot - Pain, Follow-up    Patient is a 39 year old gentleman comes in for evaluation of right shoulder pain and loose feeling.  He  had a seizure about 4 years ago and he felt that his shoulder slipped out of place while he was in the ambulance.  Since then he is states that he has had sensation of something moving inside the shoulder.  He has some pain that limits full range of motion but overall the pain is manageable.  He has not ever dislocated his shoulder as far as he knows.  He denies any numbness and tingling or neck pain.  In terms of the right foot he is about 1 year status post intramedullary screw placement for a Jones fracture.  He has done well from this but he still has some pain around the lateral aspect of the foot near the surgical scar.  It is slightly tender to touch.   Review of Systems  Constitutional: Negative.   All other systems reviewed and are negative.    Objective: Vital Signs: There were no vitals taken for this visit.  Physical Exam Vitals and nursing note reviewed.  Constitutional:      Appearance: He is well-developed.  Pulmonary:     Effort: Pulmonary effort is normal.  Abdominal:     Palpations: Abdomen is soft.  Skin:    General: Skin is warm.  Neurological:     Mental Status: He is  alert and oriented to person, place, and time.  Psychiatric:        Behavior: Behavior normal.        Thought Content: Thought content normal.        Judgment: Judgment normal.     Ortho Exam Right foot shows a fully healed surgical scar.  He has tender near the surgical scar and just distal to it.  I I think that I can feel the head of the screw which does cause some irritation and tenderness.  Right shoulder shows mild to moderate pain with extremes of range of motion.  Negative apprehension.  Normal strength manual muscle testing.  Pain with Speed test.  Negative empty can.  Negative Hawkins sign.  Negative Kim test. Specialty Comments:  No specialty comments available.  Imaging: XR Foot Complete Right  Result Date: 02/22/2020 Status post screw placement for fifth metatarsal fracture.   The fracture has completely healed and remodeled.  There is some erosive changes along the lateral cortex of the cuboid near the head of the screw.  XR Shoulder Right  Result Date: 02/22/2020 No acute or structural abnormalities    PMFS History: Patient Active Problem List   Diagnosis Date Noted  . Painful orthopaedic hardware ([place]) 02/22/2020  . Closed fracture of base of fifth metatarsal bone of right foot at metaphyseal-diaphyseal junction 12/30/2018  . Schizoaffective disorder, depressive type ([place])   . Schizophrenia ([place]) 03/09/2018  . Schizophrenia, paranoid type ([place]) 10/11/2017  . Overdose 03/05/2015  . Seizures ([place])   . Tachycardia   . Right shoulder pain    Past Medical History:  Diagnosis Date  . Anxiety   . Headache   . History of suicidal ideation   . Schizophrenia ([place])     History reviewed. No pertinent family history.  Past Surgical History:  Procedure Laterality Date  . APPENDECTOMY    . ORIF TOE FRACTURE Right 01/04/2019   Procedure: OPEN REDUCTION INTERNAL FIXATION (ORIF) RIGHT 5TH METATARSAL FRACTURE;  Surgeon: Leandrew Koyanagi, MD;  Location: [place];  Service: Orthopedics;  Laterality: Right;  . TIBIA FRACTURE SURGERY     right   Social History   Occupational History  . Not on file  Tobacco Use  . Smoking status: Current Every Day Smoker    Types: Cigarettes  . Smokeless tobacco: Never Used  Vaping Use  . Vaping Use: Never used  Substance and Sexual Activity  . Alcohol use: Yes    Comment: weekly  . Drug use: Yes    Types: Marijuana    Comment: hx of crack use also  . Sexual activity: Yes

## 2020-04-11 ENCOUNTER — Institutional Professional Consult (permissible substitution): Payer: 59 | Admitting: Plastic Surgery

## 2020-05-13 ENCOUNTER — Other Ambulatory Visit: Payer: Self-pay

## 2020-05-23 ENCOUNTER — Ambulatory Visit (INDEPENDENT_AMBULATORY_CARE_PROVIDER_SITE_OTHER): Payer: 59 | Admitting: Plastic Surgery

## 2020-05-23 ENCOUNTER — Other Ambulatory Visit: Payer: Self-pay

## 2020-05-23 ENCOUNTER — Encounter: Payer: Self-pay | Admitting: Plastic Surgery

## 2020-05-23 VITALS — BP 123/79 | HR 68 | Temp 97.8°F | Ht 69.0 in | Wt 170.0 lb

## 2020-05-23 DIAGNOSIS — C44692 Other specified malignant neoplasm of skin of right upper limb, including shoulder: Secondary | ICD-10-CM | POA: Diagnosis not present

## 2020-05-23 NOTE — Progress Notes (Signed)
Referring Provider No referring provider defined for this encounter.   CC:  Chief Complaint  Patient presents with  . Advice Only      [name] is an 40 y.o. male.  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  Patient says the lesion has been present for at least a year and was biopsied by Dr. Winifred Olive.  This yielded his current diagnosis.  He has not bothered by it and is not noticed much spread beyond the area of concern.  He denies any issues with the lymph nodes in that axilla.  No Known Allergies  Outpatient Encounter Medications as of 05/23/2020  Medication Sig  . aspirin EC 81 MG tablet Take 1 tablet (81 mg total) by mouth 2 (two) times daily. (Patient not taking: Reported on 03/14/2019)  . benztropine (COGENTIN) 0.5 MG tablet Take 0.5 mg by mouth daily.  Marland Kitchen doxepin (SINEQUAN) 10 MG capsule Take 10 mg by mouth at bedtime.  Marland Kitchen FLUoxetine (PROZAC) 40 MG capsule Take 40 mg by mouth daily.  . hydrOXYzine (VISTARIL) 25 MG capsule Take 25 mg by mouth 2 (two) times daily.  Lorayne Bender TRINZA 546 MG/1.75ML SUSY Inject 546 mg into the muscle every 3 (three) months.   . ondansetron (ZOFRAN) 4 MG tablet Take 1-2 tablets (4-8 mg total) by mouth every 8 (eight) hours as needed for nausea or vomiting. (Patient not taking: Reported on 03/14/2019)  . prazosin (MINIPRESS) 1 MG capsule Take 1 mg by mouth at bedtime.  . traMADol (ULTRAM) 50 MG tablet Take 1 tablet (50 mg total) by mouth 3 (three) times daily as needed. (Patient not taking: Reported on 03/14/2019)   No facility-administered encounter medications on file as of 05/23/2020.     Past Medical History:  Diagnosis Date  . Anxiety   . Headache   . History of suicidal ideation   . Schizophrenia [facility])     Past Surgical History:  Procedure Laterality Date  . APPENDECTOMY    . ORIF TOE FRACTURE Right 01/04/2019   Procedure: OPEN REDUCTION INTERNAL FIXATION (ORIF) RIGHT 5TH  METATARSAL FRACTURE;  Surgeon: Leandrew Koyanagi, MD;  Location: [place];  Service: Orthopedics;  Laterality: Right;  . TIBIA FRACTURE SURGERY     right    No family history on file.  Social History   Social History Narrative   [name] lives at home with his mother in a house that his brother / guardian provides. 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 Has a history of alcohol drug abuse. Currently smokes marijuana and cigarettes daily with occasional etoh use (12/30/18).  His brother Kennon Rounds is his legal guardian.     Review of Systems General: Denies fevers, chills, weight loss CV: Denies chest pain, shortness of breath, palpitations  Physical Exam Vitals with BMI 05/23/2020 12/13/2019 12/13/2019  Height 5\' 9"  - -  Weight 170 lbs - -  BMI 78.29 - -  Systolic 562 130 865  Diastolic 79 87 83  Pulse 68 101 85  Some encounter information is confidential and restricted. Go to Review Flowsheets activity to see all data.    General:  No acute distress,  Alert and oriented, Non-Toxic, Normal speech and affect Examination shows a 2 to 3 cm scar in the mid lateral upper arm.  There is a firm fairly ill-defined mass that looks to have a clinical size of around 3 cm in diameter.  Assessment/Plan Patient presents with biopsy-proven dermatofibrosarcoma protuberans.  I explained that treatment would be wide local excision.  I explained that it would be rare for this to have metastasized at this point.  I explained that wide margins were required to clear the tumor and that he may require a skin graft.  He would like to do this under general anesthesia.  We will plan to set this up.  We discussed the risk that include bleeding, infection, damage to surrounding structures and need for additional procedures.  All of his questions were answered and we will plan to proceed.  Cindra Presume 05/23/2020, 9:57 AM

## 2020-06-03 ENCOUNTER — Telehealth: Payer: Self-pay | Admitting: Surgical

## 2020-06-03 ENCOUNTER — Encounter: Payer: Self-pay | Admitting: Surgical

## 2020-06-03 ENCOUNTER — Other Ambulatory Visit: Payer: Self-pay | Admitting: Surgical

## 2020-06-03 ENCOUNTER — Other Ambulatory Visit: Payer: Self-pay

## 2020-06-03 ENCOUNTER — Ambulatory Visit (INDEPENDENT_AMBULATORY_CARE_PROVIDER_SITE_OTHER): Payer: 59 | Admitting: Surgical

## 2020-06-03 VITALS — BP 121/77 | HR 88 | Ht 69.0 in | Wt 172.2 lb

## 2020-06-03 DIAGNOSIS — C44692 Other specified malignant neoplasm of skin of right upper limb, including shoulder: Secondary | ICD-10-CM

## 2020-06-03 MED ORDER — ONDANSETRON HCL 4 MG PO TABS: 4.0000 mg | ORAL_TABLET | Freq: Three times a day (TID) | ORAL | 0 refills | Status: AC | PRN

## 2020-06-03 MED ORDER — HYDROCODONE-ACETAMINOPHEN 5-325 MG PO TABS: 1.0000 | ORAL_TABLET | Freq: Four times a day (QID) | ORAL | 0 refills | Status: DC | PRN

## 2020-06-03 NOTE — H&P (View-Only) (Signed)
Patient ID: [name], male    DOB: [birth year], 40 y.o.   MRN: [id number]  Chief Complaint  Patient presents with  . Pre-op Exam      ICD-10-CM   1. Dermatofibrosarcoma protubera of right shoulder  C44.692     History of Present Illness: [name] is a 40 y.o.  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  He presents for preoperative evaluation for upcoming procedure, excision of a right arm dermatofibrosarcoma with possible skin graft, scheduled for 06/18/2020 with Dr. Claudia Desanctis.  The patient has not had problems with anesthesia. No history of DVT/PE.  No family history of DVT/PE.  No family or personal history of bleeding or clotting disorders.  Patient is not currently taking any blood thinners.  No history of CVA/MI.   Summary of Previous Visit: Patient says the lesion has been present for at least a year and was biopsied by Dr. Winifred Olive.  PMH Significant for: Seizures, tachycardia, schizophrenia, anxiety, GERD  Patient reports he is doing well, has no complaints.  He reports he is anxious for surgery.  He reports he is no longer taking aspirin 81 mg.  Past Medical History: Allergies: No Known Allergies  Current Medications:  Current Outpatient Medications:  .  aspirin EC 81 MG tablet, Take 1 tablet (81 mg total) by mouth 2 (two) times daily., Disp: 28 tablet, Rfl: 0 .  benztropine (COGENTIN) 0.5 MG tablet, Take 0.5 mg by mouth daily., Disp: , Rfl:  .  doxepin (SINEQUAN) 10 MG capsule, Take 10 mg by mouth at bedtime., Disp: , Rfl:  .  FLUoxetine (PROZAC) 40 MG capsule, Take 40 mg by mouth daily., Disp: , Rfl:  .  hydrOXYzine (VISTARIL) 25 MG capsule, Take 25 mg by mouth 2 (two) times daily., Disp: , Rfl:  .  INVEGA TRINZA 546 MG/1.75ML SUSY, Inject 546 mg into the muscle every 3 (three) months. , Disp: , Rfl:  .  ondansetron (ZOFRAN) 4 MG tablet, Take 1 tablet (4 mg total) by mouth every 8 (eight) hours as needed for nausea or vomiting.,  Disp: 20 tablet, Rfl: 0 .  prazosin (MINIPRESS) 1 MG capsule, Take 1 mg by mouth at bedtime., Disp: , Rfl:  .  traMADol (ULTRAM) 50 MG tablet, Take 1 tablet (50 mg total) by mouth 3 (three) times daily as needed., Disp: 30 tablet, Rfl: 0  Past Medical Problems: Past Medical History:  Diagnosis Date  . Anxiety   . Headache   . History of suicidal ideation   . Schizophrenia [facility])     Past Surgical History: Past Surgical History:  Procedure Laterality Date  . APPENDECTOMY    . ORIF TOE FRACTURE Right 01/04/2019   Procedure: OPEN REDUCTION INTERNAL FIXATION (ORIF) RIGHT 5TH METATARSAL FRACTURE;  Surgeon: Leandrew Koyanagi, MD;  Location: [place];  Service: Orthopedics;  Laterality: Right;  . TIBIA FRACTURE SURGERY     right    Social History: Social History   Socioeconomic History  . Marital status: Single    Spouse name: Not on file  . Number of children: Not on file  . Years of education: Not on file  . Highest education level: Not on file  Occupational History  . Not on file  Tobacco Use  . Smoking status: Current Every Day Smoker    Types: Cigarettes  . Smokeless tobacco: Never Used  Vaping Use  . Vaping Use: Never used  Substance  and Sexual Activity  . Alcohol use: Yes    Comment: weekly  . Drug use: Yes    Types: Marijuana    Comment: hx of crack use also  . Sexual activity: Yes  Other Topics Concern  . Not on file  Social History Narrative   Aubrey lives at home with his mother in a house that his brother / guardian provides. 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 Has a history of alcohol drug abuse. Currently smokes marijuana and cigarettes daily with occasional etoh use (12/30/18).  His brother Kennon Rounds is his legal guardian.   Social Determinants of Health   Financial Resource Strain: Not on file  Food Insecurity: Not on file  Transportation Needs: Not on file  Physical Activity: Not on file  Stress: Not on  file  Social Connections: Not on file  Intimate Partner Violence: Not on file    Family History: History reviewed. No pertinent family history.  Review of Systems: Review of Systems  Constitutional: Negative.   Respiratory: Negative.   Cardiovascular: Negative.   Neurological: Negative.     Physical Exam: Vital Signs BP 121/77 (BP Location: Left Arm, Patient Position: Sitting, Cuff Size: Normal)   Pulse 88   Ht 5\' 9"  (1.753 m)   Wt 172 lb 3.2 oz (78.1 kg)   SpO2 96%   BMI 25.43 kg/m   Physical Exam  Constitutional:      General: Not in acute distress.    Appearance: Normal appearance. Not ill-appearing.  HENT:     Head: Normocephalic and atraumatic.  Eyes:     Pupils: Pupils are equal, round Neck:     Musculoskeletal: Normal range of motion.  Cardiovascular:     Rate and Rhythm: Normal rate and regular rhythm.     Pulses: Normal pulses.     Heart sounds: Normal heart sounds. No murmur.  Pulmonary:     Effort: Pulmonary effort is normal. No respiratory distress.     Breath sounds: Normal breath sounds. No wheezing.  Abdominal:     General: Abdomen is flat. There is no distension.     Palpations: Abdomen is soft.     Tenderness: There is no abdominal tenderness.  Musculoskeletal: Normal range of motion.  Skin:    General: Skin is warm and dry.  Right arm incision noted overlying area of concern.    Findings: No erythema or rash.  Neurological:     General: No focal deficit present.     Mental Status: Alert and oriented to person, place, and time. Mental status is at baseline.     Motor: No weakness.  Psychiatric:        Mood and Affect: Mood normal.        Behavior: Behavior normal.    Assessment/Plan: The patient is scheduled for excision of right arm dermatofibrosarcoma, possible skin graft with Dr. Claudia Desanctis.  Risks, benefits, and alternatives of procedure discussed, questions answered and consent obtained.    Smoking Status: 1.25 packs/day; Counseling  Given?  We thoroughly discussed increased risks of poor wound healing and complications associated with split-thickness or full-thickness skin graft.  Caprini Score: 4, moderate; Risk Factors include: Age, BMI greater than 25, and length of planned surgery. Recommendation for mechanical and pharmacological prophylaxis while hospitalized. Encourage early ambulation.   Pictures obtained: Today  Post-op Rx sent to pharmacy: Norco, Zofran  Patient was provided with the General Surgical Risk consent document and Pain Medication Agreement prior to their  appointment.  They had adequate time to read through the risk consent documents and Pain Medication Agreement. We also discussed them in person together during this preop appointment. All of their questions were answered to their satisfaction.  Recommended calling if they have any further questions.  Risk consent form and Pain Medication Agreement to be scanned into patient's chart.  The risks that can be encountered with and after a skin graft were discussed and include the following but not limited to these: bleeding, infection, delayed healing, anesthesia risks, skin sensation changes, injury to structures including nerves, blood vessels, and muscles which may be temporary or permanent, allergies to tape, suture materials and glues, blood products, topical preparations or injected agents, skin contour irregularities, skin discoloration and swelling, deep vein thrombosis, cardiac and pulmonary complications, pain, which may persist, failure of the graft and possible need for revisional surgery or staged procedures.  We discussed the possibility of full-thickness or split-thickness skin graft due to the size of the excision.  I discussed with the patient that there is a possibility of poor graft take, especially since he is a 1.25 packs/day smoker.  He reports he is understanding of this and he will try to quit, but he understands that given the timeline for  surgery he may still have nicotine in his system at that time.   Electronically signed by: Carola Rhine Scheeler, PA-C 06/03/2020 2:31 PM

## 2020-06-03 NOTE — Telephone Encounter (Signed)
Patient called to say his prescriptions were called into [facility] pharmacy but there is something wrong with their system and they cannot see the prescriptions. He requested they be cancelled at [employer] and sent to [facility] located inside of [place]. [address] , [address], [place], [place] [address]? Phone: [phone] Fax: [phone]  Please call patient to advise

## 2020-06-03 NOTE — Telephone Encounter (Signed)
Per [facility] call patient and inform him we will send the prescription to his [place] tomorrow once their system has been fixed.

## 2020-06-03 NOTE — Telephone Encounter (Signed)
Called and spoke with the patient and informed him of the message below.  Patient verbalized understanding and agreed.//AB/CMA

## 2020-06-03 NOTE — Progress Notes (Signed)
Cancel.

## 2020-06-03 NOTE — Progress Notes (Signed)
Patient ID: [name], male    DOB: 01/21/1981, 40 y.o.   MRN: [id number]  Chief Complaint  Patient presents with  . Pre-op Exam      ICD-10-CM   1. Dermatofibrosarcoma protubera of right shoulder  C44.692     History of Present Illness: [name] is a 40 y.o.  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  He presents for preoperative evaluation for upcoming procedure, excision of a right arm dermatofibrosarcoma with possible skin graft, scheduled for 06/18/2020 with Dr. Claudia Desanctis.  The patient has not had problems with anesthesia. No history of DVT/PE.  No family history of DVT/PE.  No family or personal history of bleeding or clotting disorders.  Patient is not currently taking any blood thinners.  No history of CVA/MI.   Summary of Previous Visit: Patient says the lesion has been present for at least a year and was biopsied by Dr. Winifred Olive.  PMH Significant for: Seizures, tachycardia, schizophrenia, anxiety, GERD  Patient reports he is doing well, has no complaints.  He reports he is anxious for surgery.  He reports he is no longer taking aspirin 81 mg.  Past Medical History: Allergies: No Known Allergies  Current Medications:  Current Outpatient Medications:  .  aspirin EC 81 MG tablet, Take 1 tablet (81 mg total) by mouth 2 (two) times daily., Disp: 28 tablet, Rfl: 0 .  benztropine (COGENTIN) 0.5 MG tablet, Take 0.5 mg by mouth daily., Disp: , Rfl:  .  doxepin (SINEQUAN) 10 MG capsule, Take 10 mg by mouth at bedtime., Disp: , Rfl:  .  FLUoxetine (PROZAC) 40 MG capsule, Take 40 mg by mouth daily., Disp: , Rfl:  .  hydrOXYzine (VISTARIL) 25 MG capsule, Take 25 mg by mouth 2 (two) times daily., Disp: , Rfl:  .  INVEGA TRINZA 546 MG/1.75ML SUSY, Inject 546 mg into the muscle every 3 (three) months. , Disp: , Rfl:  .  ondansetron (ZOFRAN) 4 MG tablet, Take 1 tablet (4 mg total) by mouth every 8 (eight) hours as needed for nausea or vomiting.,  Disp: 20 tablet, Rfl: 0 .  prazosin (MINIPRESS) 1 MG capsule, Take 1 mg by mouth at bedtime., Disp: , Rfl:  .  traMADol (ULTRAM) 50 MG tablet, Take 1 tablet (50 mg total) by mouth 3 (three) times daily as needed., Disp: 30 tablet, Rfl: 0  Past Medical Problems: Past Medical History:  Diagnosis Date  . Anxiety   . Headache   . History of suicidal ideation   . Schizophrenia [facility])     Past Surgical History: Past Surgical History:  Procedure Laterality Date  . APPENDECTOMY    . ORIF TOE FRACTURE Right 01/04/2019   Procedure: OPEN REDUCTION INTERNAL FIXATION (ORIF) RIGHT 5TH METATARSAL FRACTURE;  Surgeon: Leandrew Koyanagi, MD;  Location: [place];  Service: Orthopedics;  Laterality: Right;  . TIBIA FRACTURE SURGERY     right    Social History: Social History   Socioeconomic History  . Marital status: Single    Spouse name: Not on file  . Number of children: Not on file  . Years of education: Not on file  . Highest education level: Not on file  Occupational History  . Not on file  Tobacco Use  . Smoking status: Current Every Day Smoker    Types: Cigarettes  . Smokeless tobacco: Never Used  Vaping Use  . Vaping Use: Never used  Substance  and Sexual Activity  . Alcohol use: Yes    Comment: weekly  . Drug use: Yes    Types: Marijuana    Comment: hx of crack use also  . Sexual activity: Yes  Other Topics Concern  . Not on file  Social History Narrative   [name] lives at home with his mother in a house that his brother / guardian provides. 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 Has a history of alcohol drug abuse. Currently smokes marijuana and cigarettes daily with occasional etoh use (12/30/18).  His brother Kennon Rounds is his legal guardian.   Social Determinants of Health   Financial Resource Strain: Not on file  Food Insecurity: Not on file  Transportation Needs: Not on file  Physical Activity: Not on file  Stress: Not on  file  Social Connections: Not on file  Intimate Partner Violence: Not on file    Family History: History reviewed. No pertinent family history.  Review of Systems: Review of Systems  Constitutional: Negative.   Respiratory: Negative.   Cardiovascular: Negative.   Neurological: Negative.     Physical Exam: Vital Signs BP 121/77 (BP Location: Left Arm, Patient Position: Sitting, Cuff Size: Normal)   Pulse 88   Ht 5\' 9"  (1.753 m)   Wt 172 lb 3.2 oz (78.1 kg)   SpO2 96%   BMI 25.43 kg/m   Physical Exam  Constitutional:      General: Not in acute distress.    Appearance: Normal appearance. Not ill-appearing.  HENT:     Head: Normocephalic and atraumatic.  Eyes:     Pupils: Pupils are equal, round Neck:     Musculoskeletal: Normal range of motion.  Cardiovascular:     Rate and Rhythm: Normal rate and regular rhythm.     Pulses: Normal pulses.     Heart sounds: Normal heart sounds. No murmur.  Pulmonary:     Effort: Pulmonary effort is normal. No respiratory distress.     Breath sounds: Normal breath sounds. No wheezing.  Abdominal:     General: Abdomen is flat. There is no distension.     Palpations: Abdomen is soft.     Tenderness: There is no abdominal tenderness.  Musculoskeletal: Normal range of motion.  Skin:    General: Skin is warm and dry.  Right arm incision noted overlying area of concern.    Findings: No erythema or rash.  Neurological:     General: No focal deficit present.     Mental Status: Alert and oriented to person, place, and time. Mental status is at baseline.     Motor: No weakness.  Psychiatric:        Mood and Affect: Mood normal.        Behavior: Behavior normal.    Assessment/Plan: The patient is scheduled for excision of right arm dermatofibrosarcoma, possible skin graft with Dr. Claudia Desanctis.  Risks, benefits, and alternatives of procedure discussed, questions answered and consent obtained.    Smoking Status: 1.25 packs/day; Counseling  Given?  We thoroughly discussed increased risks of poor wound healing and complications associated with split-thickness or full-thickness skin graft.  Caprini Score: 4, moderate; Risk Factors include: Age, BMI greater than 25, and length of planned surgery. Recommendation for mechanical and pharmacological prophylaxis while hospitalized. Encourage early ambulation.   Pictures obtained: Today  Post-op Rx sent to pharmacy: Norco, Zofran  Patient was provided with the General Surgical Risk consent document and Pain Medication Agreement prior to their  appointment.  They had adequate time to read through the risk consent documents and Pain Medication Agreement. We also discussed them in person together during this preop appointment. All of their questions were answered to their satisfaction.  Recommended calling if they have any further questions.  Risk consent form and Pain Medication Agreement to be scanned into patient's chart.  The risks that can be encountered with and after a skin graft were discussed and include the following but not limited to these: bleeding, infection, delayed healing, anesthesia risks, skin sensation changes, injury to structures including nerves, blood vessels, and muscles which may be temporary or permanent, allergies to tape, suture materials and glues, blood products, topical preparations or injected agents, skin contour irregularities, skin discoloration and swelling, deep vein thrombosis, cardiac and pulmonary complications, pain, which may persist, failure of the graft and possible need for revisional surgery or staged procedures.  We discussed the possibility of full-thickness or split-thickness skin graft due to the size of the excision.  I discussed with the patient that there is a possibility of poor graft take, especially since he is a 1.25 packs/day smoker.  He reports he is understanding of this and he will try to quit, but he understands that given the timeline for  surgery he may still have nicotine in his system at that time.   Electronically signed by: Carola Rhine Scheeler, PA-C 06/03/2020 2:31 PM

## 2020-06-04 ENCOUNTER — Other Ambulatory Visit: Payer: Self-pay | Admitting: Surgical

## 2020-06-04 MED ORDER — ONDANSETRON HCL 4 MG PO TABS: 4.0000 mg | ORAL_TABLET | Freq: Three times a day (TID) | ORAL | 0 refills | Status: AC | PRN

## 2020-06-04 MED ORDER — HYDROCODONE-ACETAMINOPHEN 5-325 MG PO TABS: 1.0000 | ORAL_TABLET | Freq: Four times a day (QID) | ORAL | 0 refills | Status: AC | PRN

## 2020-06-04 NOTE — Progress Notes (Signed)
Postop pain medications, pharmacy did not receive initial prescription due to their system being down.  Confirmed this with pharmacy.

## 2020-06-10 ENCOUNTER — Other Ambulatory Visit: Payer: Self-pay

## 2020-06-10 ENCOUNTER — Encounter (HOSPITAL_BASED_OUTPATIENT_CLINIC_OR_DEPARTMENT_OTHER): Payer: Self-pay | Admitting: Plastic Surgery

## 2020-06-14 ENCOUNTER — Telehealth: Payer: Self-pay | Admitting: Surgical

## 2020-06-14 NOTE — Telephone Encounter (Addendum)
Spoke with [name]' brother [name] today on the phone.  I discussed with him the surgical plan which included wide local excision of the dermatofibrosarcoma protuberans.  I discussed with him that the plan for [place] was excision of approximately 2 to 3 cm circumferentially around the incision to increase potential for negative margins.  I did discuss with him that I would confirm this with Dr. Claudia Desanctis and update him of any changes in distance of the margins.  I discussed with him that we would try to primarily close the new wound from the excision if possible, otherwise we would need to take a full-thickness or split-thickness skin graft to cover the defect. Patient's brother (legal guardian) confirmed understanding of this and reported that he is okay moving forward with surgery and wants what is best for his brother.  He reported that he just wanted some clarification to ensure the safety of his brother.  I discussed with him that I would further consult with Dr. Claudia Desanctis to confirm any additional plans and update him if anything is changed.  Patient's brother was understanding of this and I informed him to please call us at our office with any other questions or concerns and I would be happy to talk with him.  Addendum: Confirmed plan with Dr. Claudia Desanctis. No changes.

## 2020-06-14 NOTE — Telephone Encounter (Signed)
Patient's brother and legal guardian, [name], called requesting some clarification on the upcoming surgery. He said there seemed to be some disconnect somewhere. He wants to make sure everyone is on the same page and that the surgery is happening for the right reasons. He would like to know how much of the tumor or area is to be removed and what type of exam was done to determine that. The initial provider, Dr. Winifred Olive, showed them one area but the area to be operated on appears to be bigger than what was initially shown and discussed with them. Please call brother/patient to clarify what will be removed on Tuesday and the determining factors leading up to that decision. [phone]

## 2020-06-15 ENCOUNTER — Other Ambulatory Visit (HOSPITAL_COMMUNITY)
Admission: RE | Admit: 2020-06-15 | Discharge: 2020-06-15 | Disposition: A | Discharge status: Home / Self Care | Payer: 59 | Source: Ambulatory Visit | Attending: Plastic Surgery | Admitting: Plastic Surgery

## 2020-06-15 DIAGNOSIS — Z01812 Encounter for preprocedural laboratory examination: Secondary | ICD-10-CM | POA: Insufficient documentation

## 2020-06-15 DIAGNOSIS — Z20822 Contact with and (suspected) exposure to covid-19: Secondary | ICD-10-CM | POA: Diagnosis not present

## 2020-06-15 LAB — SARS CORONAVIRUS 2 (TAT 6-24 HRS): SARS Coronavirus 2: NEGATIVE

## 2020-06-18 ENCOUNTER — Ambulatory Visit (HOSPITAL_BASED_OUTPATIENT_CLINIC_OR_DEPARTMENT_OTHER)
Admission: RE | Admit: 2020-06-18 | Discharge: 2020-06-18 | Disposition: A | Discharge status: Home / Self Care | Payer: 59 | Attending: Plastic Surgery | Admitting: Plastic Surgery

## 2020-06-18 ENCOUNTER — Other Ambulatory Visit: Payer: Self-pay

## 2020-06-18 ENCOUNTER — Encounter (HOSPITAL_BASED_OUTPATIENT_CLINIC_OR_DEPARTMENT_OTHER): Payer: Self-pay | Admitting: Plastic Surgery

## 2020-06-18 ENCOUNTER — Ambulatory Visit (HOSPITAL_BASED_OUTPATIENT_CLINIC_OR_DEPARTMENT_OTHER): Payer: 59 | Admitting: Certified Registered"

## 2020-06-18 ENCOUNTER — Encounter (HOSPITAL_BASED_OUTPATIENT_CLINIC_OR_DEPARTMENT_OTHER)
Admission: RE | Disposition: A | Discharge status: Home / Self Care | Payer: Self-pay | Source: Home / Self Care | Attending: Plastic Surgery

## 2020-06-18 DIAGNOSIS — C4499 Other specified malignant neoplasm of skin, unspecified: Secondary | ICD-10-CM

## 2020-06-18 DIAGNOSIS — D2361 Other benign neoplasm of skin of right upper limb, including shoulder: Secondary | ICD-10-CM

## 2020-06-18 DIAGNOSIS — C44692 Other specified malignant neoplasm of skin of right upper limb, including shoulder: Secondary | ICD-10-CM | POA: Diagnosis present

## 2020-06-18 DIAGNOSIS — Z7982 Long term (current) use of aspirin: Secondary | ICD-10-CM | POA: Diagnosis not present

## 2020-06-18 DIAGNOSIS — Z79899 Other long term (current) drug therapy: Secondary | ICD-10-CM | POA: Diagnosis not present

## 2020-06-18 DIAGNOSIS — F17210 Nicotine dependence, cigarettes, uncomplicated: Secondary | ICD-10-CM | POA: Diagnosis not present

## 2020-06-18 HISTORY — PX: MASS EXCISION: SHX2000

## 2020-06-18 SURGERY — EXCISION MASS
Anesthesia: General | Site: Arm Upper | Laterality: Right

## 2020-06-18 MED ORDER — DEXAMETHASONE SODIUM PHOSPHATE 10 MG/ML IJ SOLN
INTRAMUSCULAR | Status: AC
  Filled 2020-06-18: qty 1

## 2020-06-18 MED ORDER — FENTANYL CITRATE (PF) 100 MCG/2ML IJ SOLN
INTRAMUSCULAR | Status: AC
  Filled 2020-06-18: qty 2

## 2020-06-18 MED ORDER — LIDOCAINE 2% (20 MG/ML) 5 ML SYRINGE
INTRAMUSCULAR | Status: DC | PRN
  Administered 2020-06-18: 100 mg via INTRAVENOUS

## 2020-06-18 MED ORDER — LACTATED RINGERS IV SOLN
INTRAVENOUS | Status: DC | PRN
  Administered 2020-06-18: 60 mL

## 2020-06-18 MED ORDER — FENTANYL CITRATE (PF) 250 MCG/5ML IJ SOLN
INTRAMUSCULAR | Status: DC | PRN
  Administered 2020-06-18 (×4): 50 ug via INTRAVENOUS

## 2020-06-18 MED ORDER — LACTATED RINGERS IV SOLN: INTRAVENOUS | Status: DC | PRN

## 2020-06-18 MED ORDER — ACETAMINOPHEN 160 MG/5ML PO SOLN: 1000.0000 mg | Freq: Once | ORAL | Status: AC | PRN

## 2020-06-18 MED ORDER — DEXAMETHASONE SODIUM PHOSPHATE 10 MG/ML IJ SOLN
INTRAMUSCULAR | Status: DC | PRN
  Administered 2020-06-18: 10 mg via INTRAVENOUS

## 2020-06-18 MED ORDER — ACETAMINOPHEN 500 MG PO TABS
1000.0000 mg | ORAL_TABLET | Freq: Once | ORAL | Status: AC | PRN
  Administered 2020-06-18: 1000 mg via ORAL

## 2020-06-18 MED ORDER — ONDANSETRON HCL 4 MG/2ML IJ SOLN
INTRAMUSCULAR | Status: DC | PRN
  Administered 2020-06-18: 4 mg via INTRAVENOUS

## 2020-06-18 MED ORDER — MINERAL OIL LIGHT OIL
TOPICAL_OIL | Status: AC
  Filled 2020-06-18: qty 10

## 2020-06-18 MED ORDER — BACITRACIN ZINC 500 UNIT/GM EX OINT
TOPICAL_OINTMENT | CUTANEOUS | Status: AC
  Filled 2020-06-18: qty 28.35

## 2020-06-18 MED ORDER — PROPOFOL 10 MG/ML IV BOLUS
INTRAVENOUS | Status: DC | PRN
  Administered 2020-06-18: 200 mg via INTRAVENOUS
  Administered 2020-06-18 (×2): 100 mg via INTRAVENOUS

## 2020-06-18 MED ORDER — ONDANSETRON HCL 4 MG/2ML IJ SOLN
INTRAMUSCULAR | Status: AC
  Filled 2020-06-18: qty 2

## 2020-06-18 MED ORDER — PROPOFOL 10 MG/ML IV BOLUS
INTRAVENOUS | Status: AC
  Filled 2020-06-18: qty 20

## 2020-06-18 MED ORDER — ACETAMINOPHEN 500 MG PO TABS
ORAL_TABLET | ORAL | Status: AC
  Filled 2020-06-18: qty 2

## 2020-06-18 MED ORDER — OXYCODONE HCL 5 MG PO TABS
ORAL_TABLET | ORAL | Status: AC
  Filled 2020-06-18: qty 1

## 2020-06-18 MED ORDER — OXYCODONE HCL 5 MG/5ML PO SOLN: 5.0000 mg | Freq: Once | ORAL | Status: AC | PRN

## 2020-06-18 MED ORDER — FENTANYL CITRATE (PF) 100 MCG/2ML IJ SOLN
25.0000 ug | INTRAMUSCULAR | Status: DC | PRN
Start: 2020-06-18 — End: 2020-06-18
  Administered 2020-06-18 (×2): 50 ug via INTRAVENOUS

## 2020-06-18 MED ORDER — OXYCODONE HCL 5 MG PO TABS
5.0000 mg | ORAL_TABLET | Freq: Once | ORAL | Status: AC | PRN
  Administered 2020-06-18: 5 mg via ORAL

## 2020-06-18 MED ORDER — CEFAZOLIN SODIUM-DEXTROSE 2-4 GM/100ML-% IV SOLN
INTRAVENOUS | Status: AC
  Filled 2020-06-18: qty 100

## 2020-06-18 MED ORDER — LIDOCAINE-EPINEPHRINE 1 %-1:100000 IJ SOLN
INTRAMUSCULAR | Status: AC
  Filled 2020-06-18: qty 1

## 2020-06-18 MED ORDER — ACETAMINOPHEN 10 MG/ML IV SOLN: 1000.0000 mg | Freq: Once | INTRAVENOUS | Status: DC | PRN

## 2020-06-18 MED ORDER — EPINEPHRINE PF 1 MG/ML IJ SOLN
INTRAMUSCULAR | Status: AC
  Filled 2020-06-18: qty 1

## 2020-06-18 SURGICAL SUPPLY — 114 items
ADH SKN CLS APL DERMABOND .7 (GAUZE/BANDAGES/DRESSINGS)
APL PRP STRL LF DISP 70% ISPRP (MISCELLANEOUS) ×2
APL SKNCLS STERI-STRIP NONHPOA (GAUZE/BANDAGES/DRESSINGS) ×1
BALL CTTN LRG ABS STRL LF (GAUZE/BANDAGES/DRESSINGS)
BAND INSRT 18 STRL LF DISP RB (MISCELLANEOUS)
BAND RUBBER #18 3X1/16 STRL (MISCELLANEOUS) IMPLANT
BENZOIN TINCTURE PRP APPL 2/3 (GAUZE/BANDAGES/DRESSINGS) ×1 IMPLANT
BLADE CLIPPER SURG (BLADE) ×1 IMPLANT
BLADE DERMATOME SS (BLADE) ×1 IMPLANT
BLADE SURG 10 STRL SS (BLADE) IMPLANT
BLADE SURG 15 STRL LF DISP TIS (BLADE) ×1 IMPLANT
BLADE SURG 15 STRL SS (BLADE) ×2
BNDG COHESIVE 4X5 TAN STRL (GAUZE/BANDAGES/DRESSINGS) ×2 IMPLANT
BNDG ELASTIC 3X5.8 VLCR STR LF (GAUZE/BANDAGES/DRESSINGS) IMPLANT
BNDG ELASTIC 4X5.8 VLCR STR LF (GAUZE/BANDAGES/DRESSINGS) ×1 IMPLANT
BNDG ELASTIC 6X5.8 VLCR STR LF (GAUZE/BANDAGES/DRESSINGS) IMPLANT
BNDG GAUZE ELAST 4 BULKY (GAUZE/BANDAGES/DRESSINGS) ×2 IMPLANT
CANISTER SUCT 1200ML W/VALVE (MISCELLANEOUS) ×1 IMPLANT
CHLORAPREP W/TINT 26 (MISCELLANEOUS) ×3 IMPLANT
COTTONBALL LRG STERILE PKG (GAUZE/BANDAGES/DRESSINGS) IMPLANT
COVER BACK TABLE 60X90IN (DRAPES) ×2 IMPLANT
COVER MAYO STAND STRL (DRAPES) ×2 IMPLANT
COVER WAND RF STERILE (DRAPES) IMPLANT
DECANTER SPIKE VIAL GLASS SM (MISCELLANEOUS) IMPLANT
DERMABOND ADVANCED (GAUZE/BANDAGES/DRESSINGS)
DERMABOND ADVANCED .7 DNX12 (GAUZE/BANDAGES/DRESSINGS) IMPLANT
DERMACARRIERS GRAFT 1 TO 1.5 (DISPOSABLE)
DRAIN JP 10F RND SILICONE (MISCELLANEOUS) IMPLANT
DRAPE LAPAROTOMY 100X72 PEDS (DRAPES) IMPLANT
DRAPE SURG 17X23 STRL (DRAPES) IMPLANT
DRAPE U-SHAPE 76X120 STRL (DRAPES) ×1 IMPLANT
DRAPE UTILITY XL STRL (DRAPES) ×2 IMPLANT
DRSG ADAPTIC 3X8 NADH LF (GAUZE/BANDAGES/DRESSINGS) IMPLANT
DRSG EMULSION OIL 3X3 NADH (GAUZE/BANDAGES/DRESSINGS) IMPLANT
DRSG PAD ABDOMINAL 8X10 ST (GAUZE/BANDAGES/DRESSINGS) ×2 IMPLANT
DRSG TELFA 3X8 NADH (GAUZE/BANDAGES/DRESSINGS) IMPLANT
ELECT COATED BLADE 2.86 ST (ELECTRODE) ×1 IMPLANT
ELECT NDL BLADE 2-5/6 (NEEDLE) ×1 IMPLANT
ELECT NEEDLE BLADE 2-5/6 (NEEDLE) ×2 IMPLANT
ELECT REM PT RETURN 9FT ADLT (ELECTROSURGICAL) ×2
ELECT REM PT RETURN 9FT PED (ELECTROSURGICAL)
ELECTRODE REM PT RETRN 9FT PED (ELECTROSURGICAL) IMPLANT
ELECTRODE REM PT RTRN 9FT ADLT (ELECTROSURGICAL) IMPLANT
EVACUATOR SILICONE 100CC (DRAIN) IMPLANT
GAUZE SPONGE 4X4 12PLY STRL (GAUZE/BANDAGES/DRESSINGS) IMPLANT
GAUZE SPONGE 4X4 12PLY STRL LF (GAUZE/BANDAGES/DRESSINGS) IMPLANT
GAUZE XEROFORM 1X8 LF (GAUZE/BANDAGES/DRESSINGS) IMPLANT
GLOVE SRG 8 PF TXTR STRL LF DI (GLOVE) ×1 IMPLANT
GLOVE SURG ENC MOIS LTX SZ7.5 (GLOVE) ×2 IMPLANT
GLOVE SURG ENC TEXT LTX SZ7.5 (GLOVE) ×3 IMPLANT
GLOVE SURG UNDER POLY LF SZ8 (GLOVE) ×4
GOWN STRL REUS W/ TWL LRG LVL3 (GOWN DISPOSABLE) ×3 IMPLANT
GOWN STRL REUS W/TWL LRG LVL3 (GOWN DISPOSABLE) ×6
GRAFT DERMACARRIERS 1 TO 1.5 (DISPOSABLE) IMPLANT
HYDROGEN PEROXIDE 16OZ (MISCELLANEOUS) ×2 IMPLANT
NDL FILTER BLUNT 18X1 1/2 (NEEDLE) IMPLANT
NDL HYPO 25X1 1.5 SAFETY (NEEDLE) ×1 IMPLANT
NDL HYPO 30GX1 BEV (NEEDLE) IMPLANT
NDL PRECISIONGLIDE 27X1.5 (NEEDLE) ×1 IMPLANT
NDL SAFETY ECLIPSE 18X1.5 (NEEDLE) IMPLANT
NEEDLE FILTER BLUNT 18X 1/2SAF (NEEDLE) ×1
NEEDLE FILTER BLUNT 18X1 1/2 (NEEDLE) ×1 IMPLANT
NEEDLE HYPO 18GX1.5 SHARP (NEEDLE) ×2
NEEDLE HYPO 25X1 1.5 SAFETY (NEEDLE) ×2 IMPLANT
NEEDLE HYPO 30GX1 BEV (NEEDLE) IMPLANT
NEEDLE PRECISIONGLIDE 27X1.5 (NEEDLE) ×2 IMPLANT
NS IRRIG 1000ML POUR BTL (IV SOLUTION) IMPLANT
PACK BASIN DAY SURGERY FS (CUSTOM PROCEDURE TRAY) ×2 IMPLANT
PAD CAST 3X4 CTTN HI CHSV (CAST SUPPLIES) IMPLANT
PAD CAST 4YDX4 CTTN HI CHSV (CAST SUPPLIES) IMPLANT
PAD DRESSING TELFA 3X8 NADH (GAUZE/BANDAGES/DRESSINGS) IMPLANT
PADDING CAST COTTON 3X4 STRL (CAST SUPPLIES)
PADDING CAST COTTON 4X4 STRL (CAST SUPPLIES)
PENCIL SMOKE EVACUATOR (MISCELLANEOUS) ×2 IMPLANT
SHEET MEDIUM DRAPE 40X70 STRL (DRAPES) ×1 IMPLANT
SLEEVE SCD COMPRESS KNEE MED (STOCKING) ×1 IMPLANT
SPONGE GAUZE 2X2 8PLY STRL LF (GAUZE/BANDAGES/DRESSINGS) IMPLANT
SPONGE LAP 18X18 RF (DISPOSABLE) ×2 IMPLANT
STAPLER INSORB 30 2030 C-SECTI (MISCELLANEOUS) ×1 IMPLANT
STAPLER VISISTAT 35W (STAPLE) ×2 IMPLANT
STOCKINETTE 4X48 STRL (DRAPES) IMPLANT
STOCKINETTE 6  STRL (DRAPES)
STOCKINETTE 6 STRL (DRAPES) IMPLANT
STOCKINETTE IMPERVIOUS LG (DRAPES) ×1 IMPLANT
STRIP CLOSURE SKIN 1/2X4 (GAUZE/BANDAGES/DRESSINGS) ×1 IMPLANT
SUCTION FRAZIER HANDLE 10FR (MISCELLANEOUS)
SUCTION TUBE FRAZIER 10FR DISP (MISCELLANEOUS) IMPLANT
SURGILUBE 2OZ TUBE FLIPTOP (MISCELLANEOUS) IMPLANT
SUT CHROMIC 4 0 PS 2 18 (SUTURE) IMPLANT
SUT CHROMIC 5 0 P 3 (SUTURE) IMPLANT
SUT ETHILON 4 0 PS 2 18 (SUTURE) ×1 IMPLANT
SUT MNCRL AB 4-0 PS2 18 (SUTURE) IMPLANT
SUT MON AB 5-0 P3 18 (SUTURE) IMPLANT
SUT PDS 3-0 CT2 (SUTURE) ×4
SUT PDS II 3-0 CT2 27 ABS (SUTURE) IMPLANT
SUT PLAIN 5 0 P 3 18 (SUTURE) IMPLANT
SUT PROLENE 5 0 P 3 (SUTURE) IMPLANT
SUT SILK 3 0 SH CR/8 (SUTURE) IMPLANT
SUT SILK 4 0 PS 2 (SUTURE) ×1 IMPLANT
SUT VIC AB 5-0 P-3 18X BRD (SUTURE) IMPLANT
SUT VIC AB 5-0 P3 18 (SUTURE)
SUT VICRYL 4-0 PS2 18IN ABS (SUTURE) IMPLANT
SUT VLOC 90 P-14 23 (SUTURE) ×1 IMPLANT
SWAB COLLECTION DEVICE MRSA (MISCELLANEOUS) IMPLANT
SWAB CULTURE ESWAB REG 1ML (MISCELLANEOUS) IMPLANT
SYR 50ML LL SCALE MARK (SYRINGE) ×1 IMPLANT
SYR BULB EAR ULCER 3OZ GRN STR (SYRINGE) IMPLANT
SYR CONTROL 10ML LL (SYRINGE) ×2 IMPLANT
TOWEL GREEN STERILE FF (TOWEL DISPOSABLE) ×2 IMPLANT
TRAY DSU PREP LF (CUSTOM PROCEDURE TRAY) ×2 IMPLANT
TUBE CONNECTING 20X1/4 (TUBING) IMPLANT
TUBING INFILTRATION IT-10001 (TUBING) IMPLANT
UNDERPAD 30X36 HEAVY ABSORB (UNDERPADS AND DIAPERS) ×2 IMPLANT
YANKAUER SUCT BULB TIP NO VENT (SUCTIONS) IMPLANT

## 2020-06-18 NOTE — Anesthesia Procedure Notes (Signed)
Procedure Name: LMA Insertion Date/Time: 06/18/2020 1:27 PM Performed by: Myna Bright, CRNA Pre-anesthesia Checklist: Patient identified, Emergency Drugs available, Suction available and Patient being monitored Patient Re-evaluated:Patient Re-evaluated prior to induction Oxygen Delivery Method: Circle system utilized Preoxygenation: Pre-oxygenation with 100% oxygen Induction Type: IV induction Ventilation: Mask ventilation without difficulty LMA: LMA inserted LMA Size: 4.0 Tube type: Oral Number of attempts: 1 Placement Confirmation: positive ETCO2 and breath sounds checked- equal and bilateral Tube secured with: Tape Dental Injury: Teeth and Oropharynx as per pre-operative assessment

## 2020-06-18 NOTE — Discharge Instructions (Signed)
Activity: As tolerated, but avoid strenuous activity until follow up visit.  Diet: Regular  Wound Care: Keep dressing clean & dry for 2 days.  After that you can shower normally.  Redress the wound as needed for comfort.  Special Instructions:  Call our office if any unusual problems occur such as pain, excessive bleeding, unrelieved nausea/vomiting, fever &/or chills.  Follow-up appointment: Scheduled for next week.   Post Anesthesia Home Care Instructions  Activity: Get plenty of rest for the remainder of the day. A responsible individual must stay with you for 24 hours following the procedure.  For the next 24 hours, DO NOT: -Drive a car -Operate machinery -Drink alcoholic beverages -Take any medication unless instructed by your physician -Make any legal decisions or sign important papers.  Meals: Start with liquid foods such as gelatin or soup. Progress to regular foods as tolerated. Avoid greasy, spicy, heavy foods. If nausea and/or vomiting occur, drink only clear liquids until the nausea and/or vomiting subsides. Call your physician if vomiting continues.  Special Instructions/Symptoms: Your throat may feel dry or sore from the anesthesia or the breathing tube placed in your throat during surgery. If this causes discomfort, gargle with warm salt water. The discomfort should disappear within 24 hours.  If you had a scopolamine patch placed behind your ear for the management of post- operative nausea and/or vomiting:  1. The medication in the patch is effective for 72 hours, after which it should be removed.  Wrap patch in a tissue and discard in the trash. Wash hands thoroughly with soap and water. 2. You may remove the patch earlier than 72 hours if you experience unpleasant side effects which may include dry mouth, dizziness or visual disturbances. 3. Avoid touching the patch. Wash your hands with soap and water after contact with the patch.     

## 2020-06-18 NOTE — Op Note (Signed)
Operative Note   DATE OF OPERATION: 06/18/2020  SURGICAL DEPARTMENT: Plastic Surgery  PREOPERATIVE DIAGNOSES:  Right upper arm dermatofibrosarcoma protuberans  POSTOPERATIVE DIAGNOSES:  same  PROCEDURE:  1. Wide local excision right upperarm dermatofibrosarcoma protuberans 8 x 8 cm 2. Complex closure right upper arm 18 cm in length   SURGEON: Talmadge Coventry, MD  ASSISTANT: [place], RNFA The advanced practice practitioner (APP) assisted throughout the case.  The APP was essential in retraction and counter traction when needed to make the case progress smoothly.  This retraction and assistance made it possible to see the tissue plans for the procedure.  The assistance was needed for blood control, tissue re-approximation and assisted with closure of the incision site.  ANESTHESIA:  General.   COMPLICATIONS: None.   INDICATIONS FOR PROCEDURE:  The patient, [name] is a 40 y.o. male born on June 25, 1980, is here for treatment of right upper arm dermatofibrosarcoma protuberans. MRN: [id number]  CONSENT:  Informed consent was obtained directly from the patient. Risks, benefits and alternatives were fully discussed. Specific risks including but not limited to bleeding, infection, hematoma, seroma, scarring, pain, contracture, asymmetry, wound healing problems, and need for further surgery were all discussed. The patient did have an ample opportunity to have questions answered to satisfaction.   DESCRIPTION OF PROCEDURE:  The patient was taken to the operating room. SCDs were placed and antibiotics were given. Gneral anesthesia was administered.  The patient's operative site was prepped and draped in a sterile fashion. A time out was performed and all information was confirmed to be correct.  I started by marking out the lesion.  It was about 2.5-3cm in size with a central scar from the biopsy.  I marked out 2.5-3cm margins.  The area was infiltrated with tumescent solution.  Excision  was done with a knife.  This excision went down to and included fascia.  The specimen was then marked with a suture marking 12:00.  This corresponded with the superior apex of the incision.  At this point meticulous hemostasis was obtained.  I had initially expected to perform a skin graft however after advancing and doing some slight undermining of the surrounding skin it did appear that this would close primarily.  I did have to take dogears out superiorly and inferior that were sent as separate specimens as the superior and inferior margin.  At this point I did a layered closure using buried 3-0 PDS in an sorb stapler followed by a 3 OV lock.  This gave a nice on table result with good contour for his arm.  Steri-Strips and a soft compressive dressing were applied.  The total size of the specimen was approximately 8 x 8 cm.  The total length of the closure ended up being 18 cm in length.  The patient tolerated the procedure well.  There were no complications. The patient was allowed to wake from anesthesia, extubated and taken to the recovery room in satisfactory condition.

## 2020-06-18 NOTE — Interval H&P Note (Signed)
History and Physical Interval Note:  06/18/2020 12:38 PM  [name]  has presented today for surgery, with the diagnosis of Dermatofibrosarcoma protubera of right shoulder.  The various methods of treatment have been discussed with the patient and family. After consideration of risks, benefits and other options for treatment, the patient has consented to  Procedure(s) with comments: Excision right arm dermatofibrosarcoma protuberans (Right) - needs RNFA SKIN GRAFT SPLIT THICKNESS (Right) as a surgical intervention.  The patient's history has been reviewed, patient examined, no change in status, stable for surgery.  I have reviewed the patient's chart and labs.  Questions were answered to the patient's satisfaction.     Cindra Presume

## 2020-06-18 NOTE — Transfer of Care (Signed)
Immediate Anesthesia Transfer of Care Note  Patient: [name]  Procedure(s) Performed: Excision right arm dermatofibrosarcoma protuberans (Right Arm Upper)  Patient Location: PACU  Anesthesia Type:General  Level of Consciousness: awake, alert , oriented and patient cooperative  Airway & Oxygen Therapy: Patient Spontanous Breathing and Patient connected to face mask oxygen  Post-op Assessment: Report given to RN, Post -op Vital signs reviewed and stable and Patient moving all extremities  Post vital signs: Reviewed and stable  Last Vitals:  Vitals Value Taken Time  BP 136/93 06/18/20 1440  Temp    Pulse 92 06/18/20 1442  Resp 22 06/18/20 1442  SpO2 99 % 06/18/20 1442  Vitals shown include unvalidated device data.  Last Pain:  Vitals:   06/18/20 1200  TempSrc: Oral  PainSc: 0-No pain      Patients Stated Pain Goal: 1 (25/85/27 7824)  Complications: No complications documented.

## 2020-06-18 NOTE — Anesthesia Preprocedure Evaluation (Signed)
Anesthesia Evaluation  Patient identified by MRN, date of birth, ID band Patient awake    Reviewed: Allergy & Precautions, NPO status , Patient's Chart, lab work & pertinent test results  History of Anesthesia Complications Negative for: history of anesthetic complications  Airway Mallampati: II  TM Distance: >3 FB Neck ROM: Full    Dental  (+) Dental Advisory Given, Poor Dentition   Pulmonary Current SmokerPatient did not abstain from smoking.,    breath sounds clear to auscultation       Cardiovascular (-) CHF negative cardio ROS   Rhythm:Regular     Neuro/Psych Seizures -, Well Controlled,  PSYCHIATRIC DISORDERS Anxiety Depression Schizophrenia    GI/Hepatic negative GI ROS, Neg liver ROS,   Endo/Other  negative endocrine ROS  Renal/GU      Musculoskeletal negative musculoskeletal ROS (+)   Abdominal   Peds  Hematology negative hematology ROS (+)   Anesthesia Other Findings   Reproductive/Obstetrics                             Anesthesia Physical Anesthesia Plan  ASA: III  Anesthesia Plan: General   Post-op Pain Management:    Induction: Intravenous  PONV Risk Score and Plan: 1 and Ondansetron and Dexamethasone  Airway Management Planned: LMA and Oral ETT  Additional Equipment: None  Intra-op Plan:   Post-operative Plan: Extubation in OR  Informed Consent: I have reviewed the patients History and Physical, chart, labs and discussed the procedure including the risks, benefits and alternatives for the proposed anesthesia with the patient or authorized representative who has indicated his/her understanding and acceptance.     Dental advisory given  Plan Discussed with: CRNA and Surgeon  Anesthesia Plan Comments:         Anesthesia Quick Evaluation

## 2020-06-18 NOTE — Brief Op Note (Signed)
06/18/2020  2:30 PM  PATIENT:  [name]  40 y.o. male  PRE-OPERATIVE DIAGNOSIS:  Dermatofibrosarcoma protubera of right upper arm  POST-OPERATIVE DIAGNOSIS:  Dermatofibrosarcoma protubera of right upper arm  PROCEDURE:  Procedure(s) with comments: Excision right arm dermatofibrosarcoma protuberans (Right) - needs RNFA  SURGEON:  Surgeon(s) and Role:    * Pershing Skidmore, Steffanie Dunn, MD - Primary  PHYSICIAN ASSISTANT: [place], RNFA  ASSISTANTS: none   ANESTHESIA:   general  EBL:  20 mL   BLOOD ADMINISTERED:none  DRAINS: none   LOCAL MEDICATIONS USED:  MARCAINE     SPECIMEN:  Source of Specimen:  R arm dfsp  DISPOSITION OF SPECIMEN:  PATHOLOGY  COUNTS:  YES  TOURNIQUET:  * No tourniquets in log *  DICTATION: .Dragon Dictation  PLAN OF CARE: Discharge to home after PACU  PATIENT DISPOSITION:  PACU - hemodynamically stable.   Delay start of Pharmacological VTE agent (>24hrs) due to surgical blood loss or risk of bleeding: not applicable

## 2020-06-19 ENCOUNTER — Encounter (HOSPITAL_BASED_OUTPATIENT_CLINIC_OR_DEPARTMENT_OTHER): Payer: Self-pay | Admitting: Plastic Surgery

## 2020-06-21 LAB — SURGICAL PATHOLOGY

## 2020-06-24 NOTE — Anesthesia Postprocedure Evaluation (Signed)
Anesthesia Post Note  Patient: Kamran BURT PIATEK  Procedure(s) Performed: Excision right arm dermatofibrosarcoma protuberans (Right Arm Upper)     Patient location during evaluation: PACU Anesthesia Type: General Level of consciousness: awake and alert Pain management: pain level controlled Vital Signs Assessment: post-procedure vital signs reviewed and stable Respiratory status: spontaneous breathing, nonlabored ventilation, respiratory function stable and patient connected to nasal cannula oxygen Cardiovascular status: blood pressure returned to baseline and stable Postop Assessment: no apparent nausea or vomiting Anesthetic complications: no   No complications documented.  Last Vitals:  Vitals:   06/18/20 1515 06/18/20 1530  BP: (!) 127/93 (!) 140/98  Pulse: 92 88  Resp: 17 16  Temp:  36.4 C  SpO2: 94% 95%    Last Pain:  Vitals:   06/19/20 1054  TempSrc:   PainSc: 0-No pain                 Alphonza Tramell

## 2020-06-27 ENCOUNTER — Encounter: Payer: Self-pay | Admitting: Plastic Surgery

## 2020-06-27 ENCOUNTER — Ambulatory Visit (INDEPENDENT_AMBULATORY_CARE_PROVIDER_SITE_OTHER): Payer: 59 | Admitting: Plastic Surgery

## 2020-06-27 ENCOUNTER — Other Ambulatory Visit: Payer: Self-pay

## 2020-06-27 VITALS — BP 113/73 | HR 108

## 2020-06-27 DIAGNOSIS — C44692 Other specified malignant neoplasm of skin of right upper limb, including shoulder: Secondary | ICD-10-CM

## 2020-06-27 NOTE — Progress Notes (Signed)
Patient presents 1 week postop from excision of her dermatofibrosarcoma protuberance from his right upper arm.  Negative margins were achieved.  Primary closure was also achieved.  He feels good apart from having a bit of tightness in his upper arm and shoulder area.  On exam his incision is intact with a little bit of bruising.  His right hand shows normal strength and range of motion and normal sensation.  I asked him to avoid strenuous activity and I will plan to see him again in about a month.  All of his questions were answered.

## 2020-07-24 ENCOUNTER — Telehealth: Payer: Self-pay | Admitting: Orthopaedic Surgery

## 2020-07-24 NOTE — Telephone Encounter (Signed)
FYI:   Patient's right foot HW removal with Dr. Erlinda Hong at [facility] on 07-31-20 has been rescheduled.  Patient said he'd had tumor removed from his right upper arm and needed to hold off on having surgery this week and schedule the procedure in June.     New surgery date:   June 29th @12pm   [facility] w/Dr. Erlinda Hong

## 2020-07-29 ENCOUNTER — Inpatient Hospital Stay (HOSPITAL_COMMUNITY): Admission: RE | Admit: 2020-07-29 | Payer: 59 | Source: Ambulatory Visit

## 2020-07-30 MED ORDER — PROPOFOL 10 MG/ML IV BOLUS
INTRAVENOUS | Status: AC
  Filled 2020-07-30: qty 20

## 2020-07-30 MED ORDER — ROCURONIUM BROMIDE 10 MG/ML (PF) SYRINGE
PREFILLED_SYRINGE | INTRAVENOUS | Status: AC
  Filled 2020-07-30: qty 10

## 2020-07-30 MED ORDER — GLYCOPYRROLATE PF 0.2 MG/ML IJ SOSY
PREFILLED_SYRINGE | INTRAMUSCULAR | Status: AC
  Filled 2020-07-30: qty 1

## 2020-07-30 MED ORDER — PHENYLEPHRINE 40 MCG/ML (10ML) SYRINGE FOR IV PUSH (FOR BLOOD PRESSURE SUPPORT)
PREFILLED_SYRINGE | INTRAVENOUS | Status: AC
  Filled 2020-07-30: qty 10

## 2020-07-30 MED ORDER — EPHEDRINE 5 MG/ML INJ
INTRAVENOUS | Status: AC
  Filled 2020-07-30: qty 10

## 2020-07-30 MED ORDER — FENTANYL CITRATE (PF) 250 MCG/5ML IJ SOLN
INTRAMUSCULAR | Status: AC
  Filled 2020-07-30: qty 5

## 2020-07-30 MED ORDER — SUCCINYLCHOLINE CHLORIDE 200 MG/10ML IV SOSY
PREFILLED_SYRINGE | INTRAVENOUS | Status: AC
  Filled 2020-07-30: qty 10

## 2020-07-30 MED ORDER — MIDAZOLAM HCL 2 MG/2ML IJ SOLN
INTRAMUSCULAR | Status: AC
  Filled 2020-07-30: qty 2

## 2020-07-30 MED ORDER — LIDOCAINE 2% (20 MG/ML) 5 ML SYRINGE
INTRAMUSCULAR | Status: AC
  Filled 2020-07-30: qty 5

## 2020-07-30 MED ORDER — ONDANSETRON HCL 4 MG/2ML IJ SOLN
INTRAMUSCULAR | Status: AC
  Filled 2020-07-30: qty 2

## 2020-07-30 MED ORDER — ARTIFICIAL TEARS OPHTHALMIC OINT
TOPICAL_OINTMENT | OPHTHALMIC | Status: AC
  Filled 2020-07-30: qty 3.5

## 2020-07-30 MED ORDER — NEOSTIGMINE METHYLSULFATE 3 MG/3ML IV SOSY
PREFILLED_SYRINGE | INTRAVENOUS | Status: AC
  Filled 2020-07-30: qty 3

## 2020-07-30 MED ORDER — SODIUM CHLORIDE (PF) 0.9 % IJ SOLN
INTRAMUSCULAR | Status: AC
  Filled 2020-07-30: qty 10

## 2020-08-01 ENCOUNTER — Other Ambulatory Visit: Payer: Self-pay

## 2020-08-01 ENCOUNTER — Ambulatory Visit (INDEPENDENT_AMBULATORY_CARE_PROVIDER_SITE_OTHER): Payer: 59 | Admitting: Plastic Surgery

## 2020-08-01 DIAGNOSIS — C44692 Other specified malignant neoplasm of skin of right upper limb, including shoulder: Secondary | ICD-10-CM

## 2020-08-01 NOTE — Progress Notes (Signed)
Patient presents about 6 weeks postop from excision of a large dermatofibrosarcoma protuberans for the right upper extremity.  I was able to close this primarily.  He feels like he is doing well and is healing well.  He reports normal sensation in function of his right upper extremity.  On exam his scar is healing nicely.  There is a little bit of prominence on either end but is not too bad and it looks like it should smooth out.  He demonstrates normal function of his hand and normal sensation.  I asked him to come back in 6 months so I can check him for any signs of recurrence.  I did explain that he had negative margins and I suspect he should be cured but there is still a chance of recurrence in his case.  All of his questions were answered.

## 2020-08-14 ENCOUNTER — Encounter: Payer: 59 | Admitting: Orthopaedic Surgery

## 2020-09-03 ENCOUNTER — Telehealth: Payer: Self-pay | Admitting: Orthopaedic Surgery

## 2020-09-03 NOTE — Telephone Encounter (Signed)
Patient called to cancel his surgery for removal of hardware in right foot scheduled on 10-02-20.  He is headed home ([place]) and will call to schedule upon [place].   cb  [phone]

## 2020-09-04 NOTE — Telephone Encounter (Signed)
Ok thanks 

## 2020-09-30 ENCOUNTER — Other Ambulatory Visit (HOSPITAL_COMMUNITY): Payer: 59

## 2020-10-02 ENCOUNTER — Ambulatory Visit: Admission: RE | Admit: 2020-10-02 | Payer: 59 | Source: Home / Self Care | Admitting: Orthopaedic Surgery

## 2020-10-02 ENCOUNTER — Encounter: Admission: RE | Payer: Self-pay | Source: Home / Self Care

## 2020-10-02 SURGERY — REMOVAL, HARDWARE
Anesthesia: Choice | Laterality: Right

## 2020-10-16 ENCOUNTER — Encounter: Payer: 59 | Admitting: Orthopaedic Surgery

## 2021-02-05 ENCOUNTER — Ambulatory Visit: Payer: 59 | Admitting: Plastic Surgery

## 2021-04-13 ENCOUNTER — Encounter (HOSPITAL_COMMUNITY): Payer: Self-pay | Admitting: Emergency Medicine

## 2021-04-13 ENCOUNTER — Emergency Department (HOSPITAL_COMMUNITY)
Admission: EM | Admit: 2021-04-13 | Discharge: 2021-04-13 | Disposition: A | Discharge status: Home / Self Care | Payer: Commercial Managed Care - HMO | Attending: Student | Admitting: Student

## 2021-04-13 DIAGNOSIS — R200 Anesthesia of skin: Secondary | ICD-10-CM | POA: Diagnosis not present

## 2021-04-13 DIAGNOSIS — F1210 Cannabis abuse, uncomplicated: Secondary | ICD-10-CM | POA: Diagnosis not present

## 2021-04-13 DIAGNOSIS — Z5321 Procedure and treatment not carried out due to patient leaving prior to being seen by health care provider: Secondary | ICD-10-CM | POA: Diagnosis not present

## 2021-04-13 LAB — RAPID URINE DRUG SCREEN, HOSP PERFORMED
Amphetamines: NOT DETECTED
Barbiturates: NOT DETECTED
Benzodiazepines: NOT DETECTED
Cocaine: NOT DETECTED
Opiates: NOT DETECTED
Tetrahydrocannabinol: POSITIVE — AB

## 2021-04-13 NOTE — ED Triage Notes (Signed)
Pt arrived via EMS, from home, states he feels "numb in the head" after smoking marijuana. Concerned it was laced with something.

## 2021-04-13 NOTE — ED Provider Triage Note (Signed)
Emergency Medicine Provider Triage Evaluation Note  [name] , a 41 y.o. male  was evaluated in triage.  Pt complains of "feeling numb all over." Patient states that about 1.5 hours ago he smoked an unknown substance that he thought was weed. He states that afterward he feels numb "all over my body." He states this does not feel like smoking marijuana. He also endorses having a "sensation" in his chest that he has difficulty describing. He denies shortness of breath, weakness, fevers, abdominal pain, N/V/D.   Review of Systems  Positive: See above Negative:   Physical Exam  BP (!) 129/92 (BP Location: Left Arm)    Pulse 97    Temp 98.6 F (37 C) (Oral)    Resp 18    SpO2 95%  Gen:   Awake, no distress   Resp:  Normal effort  MSK:   Moves extremities without difficulty  Other:  Neurologically intact. Strength 5/5 BUE, BLE. Sensation intact.   Medical Decision Making  Medically screening exam initiated at 12:05 PM.  Appropriate orders placed.  [name] was informed that the remainder of the evaluation will be completed by another provider, this initial triage assessment does not replace that evaluation, and the importance of remaining in the ED until their evaluation is complete.     Mickie Hillier, PA-C 04/13/21 1206

## 2021-05-05 ENCOUNTER — Other Ambulatory Visit: Payer: Self-pay

## 2021-05-05 ENCOUNTER — Encounter (HOSPITAL_COMMUNITY): Payer: Self-pay | Admitting: *Deleted

## 2021-05-05 ENCOUNTER — Emergency Department (HOSPITAL_COMMUNITY)
Admission: EM | Admit: 2021-05-05 | Discharge: 2021-05-06 | Disposition: A | Discharge status: Home / Self Care | Payer: Medicare Other | Attending: Emergency Medicine | Admitting: Emergency Medicine

## 2021-05-05 ENCOUNTER — Emergency Department (HOSPITAL_COMMUNITY): Payer: Medicare Other

## 2021-05-05 DIAGNOSIS — Z5321 Procedure and treatment not carried out due to patient leaving prior to being seen by health care provider: Secondary | ICD-10-CM | POA: Diagnosis not present

## 2021-05-05 DIAGNOSIS — R202 Paresthesia of skin: Secondary | ICD-10-CM | POA: Insufficient documentation

## 2021-05-05 DIAGNOSIS — R000 Tachycardia, unspecified: Secondary | ICD-10-CM | POA: Insufficient documentation

## 2021-05-05 DIAGNOSIS — R42 Dizziness and giddiness: Secondary | ICD-10-CM | POA: Insufficient documentation

## 2021-05-05 LAB — CBC
HCT: 47.7 % (ref 39.0–52.0)
Hemoglobin: 16.8 g/dL (ref 13.0–17.0)
MCH: 31.9 pg (ref 26.0–34.0)
MCHC: 35.2 g/dL (ref 30.0–36.0)
MCV: 90.7 fL (ref 80.0–100.0)
Platelets: 324 10*3/uL (ref 150–400)
RBC: 5.26 MIL/uL (ref 4.22–5.81)
RDW: 13.9 % (ref 11.5–15.5)
WBC: 14.4 10*3/uL — ABNORMAL HIGH (ref 4.0–10.5)
nRBC: 0 % (ref 0.0–0.2)

## 2021-05-05 NOTE — ED Triage Notes (Signed)
Pt states around 5pm he started feeling like his heart was beating fast, feels "hot" in his chest, a numbness in his lips and face. Feels dizzy. States he had 4 monster energy drinks today.

## 2021-05-06 LAB — TROPONIN I (HIGH SENSITIVITY): Troponin I (High Sensitivity): 3 ng/L (ref <18)

## 2021-05-06 LAB — BASIC METABOLIC PANEL
Anion gap: 10 (ref 5–15)
BUN: <5 mg/dL — ABNORMAL LOW (ref 6–20)
CO2: 23 mmol/L (ref 22–32)
Calcium: 9.3 mg/dL (ref 8.9–10.3)
Chloride: 99 mmol/L (ref 98–111)
Creatinine, Ser: 0.73 mg/dL (ref 0.61–1.24)
GFR, Estimated: >60 mL/min (ref >60)
Glucose, Bld: 167 mg/dL — ABNORMAL HIGH (ref 70–99)
Potassium: 4 mmol/L (ref 3.5–5.1)
Sodium: 132 mmol/L — ABNORMAL LOW (ref 135–145)

## 2021-09-15 ENCOUNTER — Telehealth: Payer: Self-pay | Admitting: Radiology

## 2021-09-15 NOTE — Telephone Encounter (Signed)
Yes, can you put in order?

## 2021-09-16 ENCOUNTER — Other Ambulatory Visit: Payer: Self-pay

## 2021-09-16 DIAGNOSIS — M25519 Pain in unspecified shoulder: Secondary | ICD-10-CM

## 2021-09-18 ENCOUNTER — Ambulatory Visit: Payer: 59 | Attending: Urology | Admitting: Physical Therapy

## 2021-09-18 ENCOUNTER — Encounter: Payer: Self-pay | Admitting: Physical Therapy

## 2021-09-18 DIAGNOSIS — R293 Abnormal posture: Secondary | ICD-10-CM | POA: Diagnosis present

## 2021-09-18 DIAGNOSIS — M62838 Other muscle spasm: Secondary | ICD-10-CM | POA: Diagnosis present

## 2021-09-18 DIAGNOSIS — M6281 Muscle weakness (generalized): Secondary | ICD-10-CM | POA: Diagnosis present

## 2021-09-18 NOTE — Therapy (Signed)
OUTPATIENT PHYSICAL THERAPY MALE PELVIC EVALUATION   Patient Name: [name] MRN: [id number] DOB:11-Nov-1980, 41 y.o., male Today's Date: 09/18/2021   PT End of Session - 09/18/21 2202     Visit Number 1    Date for PT Re-Evaluation 12/11/21    Authorization Type Medicare/medicaid    PT Start Time 1533    PT Stop Time 1609    PT Time Calculation (min) 36 min    Activity Tolerance Patient tolerated treatment well    Behavior During Therapy [facility] for tasks assessed/performed             Past Medical History:  Diagnosis Date   Anxiety    Headache    History of suicidal ideation    Schizophrenia ([place])    Past Surgical History:  Procedure Laterality Date   APPENDECTOMY     MASS EXCISION Right 06/18/2020   Procedure: Excision right arm dermatofibrosarcoma protuberans;  Surgeon: Cindra Presume, MD;  Location: [place];  Service: Plastics;  Laterality: Right;  needs RNFA   ORIF TOE FRACTURE Right 01/04/2019   Procedure: OPEN REDUCTION INTERNAL FIXATION (ORIF) RIGHT 5TH METATARSAL FRACTURE;  Surgeon: Leandrew Koyanagi, MD;  Location: [place];  Service: Orthopedics;  Laterality: Right;   TIBIA FRACTURE SURGERY     right   Patient Active Problem List   Diagnosis Date Noted   Painful orthopaedic hardware ([place]) 02/22/2020   Closed fracture of base of fifth metatarsal bone of right foot at metaphyseal-diaphyseal junction 12/30/2018   Schizoaffective disorder, depressive type ([place])    Schizophrenia ([place]) 03/09/2018   Schizophrenia, paranoid type ([place]) 10/11/2017   Overdose 03/05/2015   Seizures ([place])    Tachycardia    Right shoulder pain     PCP: None per patient  REFERRING PROVIDER: Lucas Mallow, MD  REFERRING DIAG: R30.0 (ICD-10-CM) - Dysuria N34.2 (ICD-10-CM) - Other urethritis  THERAPY DIAG:  Other muscle spasm  Abnormal posture  Rationale for Evaluation and Treatment Rehabilitation  ONSET DATE: 6 months ago  SUBJECTIVE:                                                                                                                                                                                            SUBJECTIVE STATEMENT: I have pain right at the tip of the penis when pushing to get urine out or when ejaculating.  The thing that works the most was not ejaculating. Fluid intake: gallons of water per day   PAIN:  Are you having pain? Yes NPRS scale: 10/10 Pain location:  penis  Pain type: radiating Pain  description: sharp   Aggravating factors: when urinating  Relieving factors: not pushing  PRECAUTIONS: None  WEIGHT BEARING RESTRICTIONS No  FALLS:  Has patient fallen in last 6 months? No  LIVING ENVIRONMENT: Lives with: lives with their family Lives in: House/apartment OCCUPATION: unemployed  PLOF: Independent  PATIENT GOALS no pain and learn how to manage on his own  PERTINENT HISTORY:  Schizophrenia; chronic headaches Sexual abuse: not mentioned  BOWEL MOVEMENT Pain with bowel movement: No Type of bowel movement:Strain No Fully empty rectum: Yes:   Leakage: No Pads: No Fiber supplement: No  URINATION Pain with urination: Yes Fully empty bladder: Yes: but sometimes I have to push Stream: Strong, but weak in the morning Urgency: No Frequency: every 15 minutes, nocturia 4x Leakage:  no Pads: No  INTERCOURSE Pain with intercourse:  not penetration Climax: pain with ejaculation Ejaculation: Yes:       OBJECTIVE:   DIAGNOSTIC FINDINGS:  Negative for any   COGNITION:  Overall cognitive status: Within functional limits for tasks assessed      MUSCLE LENGTH: Hamstrings: Right wfl deg; Left wfl deg   LUMBAR SPECIAL TESTS:  Straight leg raise test: Negative Active straight leg raise - increased pelvic shifting with SLR improved with stabilizing core FUNCTIONAL TESTS:  Single leg stand - wfl   Increased lordosis with squatting GAIT:  Comments:  wfl   POSTURE: rounded shoulders, increased lumbar lordosis, increased thoracic kyphosis, and anterior pelvic tilt   PELVIC ALIGNMENT:  LUMBARAROM/PROM  A/PROM A/PROM  eval  Flexion   Extension   Right lateral flexion   Left lateral flexion   Right rotation   Left rotation    (Blank rows = not tested)  LOWER EXTREMITY AROM/PROM:  A/PROM Right eval Left eval  Hip flexion    Hip extension    Hip abduction    Hip adduction    Hip internal rotation    Hip external rotation    Knee flexion    Knee extension    Ankle dorsiflexion    Ankle plantarflexion    Ankle inversion    Ankle eversion     (Blank rows = not tested)  LOWER EXTREMITY MMT: Core strength 4/5; hip 5/5 bil  PALPATION: GENERAL tight paraspinals lumbar and thoracic              External Perineal Exam deferred              Internal Pelvic Floor deferred Patient confirms identification and approves PT to assess internal pelvic floor and treatment No  PELVIC LSL:[id number]   MMT eval  Internal Anal Sphincter   External Anal Sphincter   Puborectalis   Diastasis Recti   (Blank rows = not tested)   TONE:   TODAY'S TREATMENT  EVAL    PATIENT EDUCATION:  Education details: educated on not drinking more than a gallon of water per day and stop at least 1 hour before bed Person educated: patient Education method: verbal Education comprehension: verbalizes comprehension   HOME EXERCISE PROGRAM:   ASSESSMENT:  CLINICAL IMPRESSION: Patient is a 41 y.o. male who was seen today for physical therapy evaluation and treatment for dysuria and pelvic pain.  Pt has posture abnormalities and core weakness as mentioned above with active straight leg raise and squatting.  He has decreased knowledge of healthy bladder habits and has been having this issue for many years. Pt will benefit from skilled PT to address impairments and improve function without pain.   OBJECTIVE IMPAIRMENTS  decreased strength,  increased muscle spasms, postural dysfunction, and pain.   ACTIVITY LIMITATIONS sleeping and toileting  PARTICIPATION LIMITATIONS: interpersonal relationship  PERSONAL FACTORS 3+ comorbidities: schizophrenia, headaches, anxiety  are also affecting patient's functional outcome.   REHAB POTENTIAL: Excellent  CLINICAL DECISION MAKING: Stable/uncomplicated  EVALUATION COMPLEXITY: Low   GOALS: Goals reviewed with patient? Yes  SHORT TERM GOALS: Target date: 10/16/2021   Pt will know how to practice healthy bladder habits Baseline: Goal status: INITIAL    LONG TERM GOALS: Target date: 12/11/2021   Pt will be independent with advanced HEP to maintain improvements made throughout therapy  Baseline:  Goal status: INITIAL  2.  Pt will be able to empty his bladder without pain or straining Baseline:  Goal status: INITIAL  3.  Pt will demonstrate core strength by lifting 10 lb correctly for 10 reps Baseline: increased lordosis with squatting Goal status: INITIAL  4.  Pt will have nocturia to 1x at most  Baseline:  Goal status: INITIAL     PLAN: PT FREQUENCY: 1x/week  PT DURATION: 8 weeks  PLANNED INTERVENTIONS: Therapeutic exercises, Therapeutic activity, Neuromuscular re-education, Balance training, Gait training, Patient/Family education, Joint mobilization, Dry Needling, Electrical stimulation, Cryotherapy, Moist heat, Taping, Biofeedback, and Manual therapy  PLAN FOR NEXT SESSION: STM and maybe dry needling to lumbar and thoracic ; stretch and deep breathing; issue HEP; f/u on healthy bladder habits   Camillo Flaming Subrina Vecchiarelli, PT 09/18/2021, 10:11 PM

## 2021-09-18 NOTE — Therapy (Addendum)
OUTPATIENT PHYSICAL THERAPY SHOULDER EVALUATION   Patient Name: [name] MRN: [id number] DOB:12/16/1980, 41 y.o., male Today's Date: 09/19/2021   PT End of Session - 09/19/21 1029     Visit Number 1    Number of Visits 16    Date for PT Re-Evaluation 11/14/21    Authorization Type Medicare/medicaid    PT Start Time 1020    PT Stop Time 1105    PT Time Calculation (min) 45 min    Activity Tolerance Patient tolerated treatment well    Behavior During Therapy WFL for tasks assessed/performed             Past Medical History:  Diagnosis Date   Anxiety    Headache    History of suicidal ideation    Schizophrenia ([place])    Past Surgical History:  Procedure Laterality Date   APPENDECTOMY     MASS EXCISION Right 06/18/2020   Procedure: Excision right arm dermatofibrosarcoma protuberans;  Surgeon: Cindra Presume, MD;  Location: [place];  Service: Plastics;  Laterality: Right;  needs RNFA   ORIF TOE FRACTURE Right 01/04/2019   Procedure: OPEN REDUCTION INTERNAL FIXATION (ORIF) RIGHT 5TH METATARSAL FRACTURE;  Surgeon: Leandrew Koyanagi, MD;  Location: [place];  Service: Orthopedics;  Laterality: Right;   TIBIA FRACTURE SURGERY     right   Patient Active Problem List   Diagnosis Date Noted   Painful orthopaedic hardware ([place]) 02/22/2020   Closed fracture of base of fifth metatarsal bone of right foot at metaphyseal-diaphyseal junction 12/30/2018   Schizoaffective disorder, depressive type ([place])    Schizophrenia ([place]) 03/09/2018   Schizophrenia, paranoid type ([place]) 10/11/2017   Overdose 03/05/2015   Seizures ([place])    Tachycardia    Right shoulder pain     PCP: None  REFERRING PROVIDER: Aundra Dubin, PA-C  REFERRING DIAG: Shoulder pain, unspecified chronicity, unspecified laterality [M25.519]  THERAPY DIAG:  Bilateral shoulder pain, unspecified chronicity - Plan: PT plan of care cert/re-cert  Muscle weakness (generalized) -  Plan: PT plan of care cert/re-cert  Abnormal posture - Plan: PT plan of care cert/re-cert  Rationale for Evaluation and Treatment Rehabilitation  ONSET DATE: Pt states that it started around 2013, then got worse in 2017/2018.   SUBJECTIVE:                                                                                                                       SUBJECTIVE STATEMENT: Pt presents with acute on chronic bilat shoulder pain. He reports a feeling of pain and instability in his shoulders that has progressively gotten worse over the years. He reports playing with his cat the other day when he felt his shoulder shift in and out. He denies dislocation. He reports pain with smoking and excessive movements away from his body or overhead.     PERTINENT HISTORY: Mass excision of R shoulder R ORIF Toe fracture  R Tibia Fracture sx PAIN:  Are you  having pain? No Pt reports 3/10 pain with OH movements and IR. He states that it radiates into his biceps.   PRECAUTIONS: None  WEIGHT BEARING RESTRICTIONS No  FALLS:  Has patient fallen in last 6 months? No  LIVING ENVIRONMENT: Lives with:  Pt lives with his Mother.  Lives in: House/apartment Stairs: Yes: External: 3 steps; bilateral but cannot reach both Has following equipment at home: None  OCCUPATION: Not employed.  PLOF: Independent  PATIENT GOALS Pt would like to not have pain and weakness in his shoulders anymore.   OBJECTIVE:   DIAGNOSTIC FINDINGS:  X-ray - with no significant findings.   PATIENT SURVEYS:  FOTO 47, with predicted 70 in 12 visits.   COGNITION:  Overall cognitive status: Within functional limits for tasks assessed     SENSATION: WFL  POSTURE: Rounded shoulders, forward head.   UPPER EXTREMITY ROM:   Active ROM Right eval Left eval  Shoulder flexion [facility]  Shoulder extension [facility]  Shoulder abduction [facility]  Shoulder adduction [facility]  Shoulder internal rotation [facility]  Shoulder  external rotation WFL WFL  (Blank rows = not tested)  UPPER EXTREMITY MMT:  MMT Right eval Left eval  Shoulder flexion 4- 4-  Shoulder extension    Shoulder abduction 4 4  Shoulder internal rotation 3+ P! 4+  Shoulder external rotation 4 4+  (Blank rows = not tested)  SHOULDER SPECIAL TESTS:  Impingement tests: Neer impingement test: negative and Hawkins/Kennedy impingement test: negative  SLAP lesions: Biceps load test: negative  Instability tests: Load and shift test: positive  and Apprehension test: positive  on R  Rotator cuff assessment: Drop arm test: negative, External rotation lag sign: negative, Internal rotation lag sign: negative, and Belly press test: positive on R   Biceps assessment: Speed's test: negative  JOINT MOBILITY TESTING:  Pt has hypermobility in bilat shoulders.   PALPATION:  No tenderness with palpation.    TODAY'S TREATMENT:  Creating, reviewing, and completing below HEP   PATIENT EDUCATION: Education details: Educated pt on anatomy and physiology of current symptoms, NDI questionnaire, diagnosis, prognosis, HEP,  and POC. Person educated: Patient Education method: Explanation, Demonstration, Tactile cues, Verbal cues, and Handouts Education comprehension: verbalized understanding   HOME EXERCISE PROGRAM: Access Code: [email or web address] URL: [email or web address] Date: 09/19/2021 Prepared by: Rudi Heap  Exercises - Seated Scapular Retraction  - 2 x daily - 7 x weekly - 2 sets - 10 reps - Shoulder extension with resistance - Neutral  - 2 x daily - 7 x weekly - 2 sets - 10 reps - Shoulder External Rotation and Scapular Retraction with Resistance  - 2 x daily - 7 x weekly - 2 sets - 10 reps - Standing Shoulder Row with Anchored Resistance  - 2 x daily - 7 x weekly - 2 sets - 10 reps  ASSESSMENT:  CLINICAL IMPRESSION: Patient is a 41 y.o. M who was seen today for physical therapy evaluation and treatment for bilat shoulder pain and  instability. He reports pain and apprehension with OH movements, and IR. Pt positive for instability with an anterior shift of his glenohumeral head    OBJECTIVE IMPAIRMENTS decreased ROM, decreased strength, and improper body mechanics.   ACTIVITY LIMITATIONS carrying, lifting, and reach over head  PARTICIPATION LIMITATIONS: driving, community activity, and smoking.   PERSONAL FACTORS Behavior pattern, Past/current experiences, Time since onset of injury/illness/exacerbation, and 1 comorbidity: Anxiety, hx of suicidal, schizoprenia   are also affecting patient's functional outcome.  REHAB POTENTIAL: Excellent  CLINICAL DECISION MAKING: Stable/uncomplicated  EVALUATION COMPLEXITY: Low   GOALS: Goals reviewed with patient? No  SHORT TERM GOALS: Target date: 10/17/2021    Pt will be I and compliant with initial HEP. Baseline:  Goal status: INITIAL  Baseline: Patient reports his pain does not get higher than 3/10 with activity.  Baseline:  Goal status: INITIAL  LONG TERM GOALS: Target date: 11/14/2021  (Remove Blue Hyperlink)  Pt will be independent with advanced HEP to continue to address postural limitations and muscle imbalances. Baseline: not provided Goal status: INITIAL Pt will report <2 pain in bilat shoulder with activity.  Baseline:  Goal status: INITIAL Pt will demonstrate 5/5 strength for all muscle groups.  Baseline:  Goal status: INITIAL 2.  Pt will will demonstrate negative apprehension with R UE test.  Baseline:  Goal status: INITIAL   PLAN: PT FREQUENCY: 1-2x/week  PT DURATION: 8 weeks  PLANNED INTERVENTIONS: Therapeutic exercises, Therapeutic activity, Neuromuscular re-education, Balance training, Gait training, Patient/Family education, Joint manipulation, Joint mobilization, Dry Needling, Electrical stimulation, Spinal manipulation, Spinal mobilization, Cryotherapy, Moist heat, Taping, Traction, Ultrasound, and Manual therapy  PLAN FOR NEXT  SESSION: Assess HEP/update PRN, continue to progress with periscapular strengthening exercises, monitor pain and mobility. Increase pt's functional mobility.      Lynden Ang, PT 09/19/2021, 11:27 AM

## 2021-09-19 ENCOUNTER — Encounter: Payer: Self-pay | Admitting: Physical Therapy

## 2021-09-19 ENCOUNTER — Other Ambulatory Visit: Payer: Self-pay

## 2021-09-19 ENCOUNTER — Ambulatory Visit: Payer: 59 | Attending: Physician Assistant | Admitting: Physical Therapy

## 2021-09-19 DIAGNOSIS — M6281 Muscle weakness (generalized): Secondary | ICD-10-CM | POA: Insufficient documentation

## 2021-09-19 DIAGNOSIS — M25512 Pain in left shoulder: Secondary | ICD-10-CM | POA: Insufficient documentation

## 2021-09-19 DIAGNOSIS — M25511 Pain in right shoulder: Secondary | ICD-10-CM | POA: Diagnosis present

## 2021-09-19 DIAGNOSIS — M25519 Pain in unspecified shoulder: Secondary | ICD-10-CM | POA: Insufficient documentation

## 2021-09-19 DIAGNOSIS — M62838 Other muscle spasm: Secondary | ICD-10-CM | POA: Diagnosis present

## 2021-09-19 DIAGNOSIS — R293 Abnormal posture: Secondary | ICD-10-CM | POA: Diagnosis present

## 2021-09-24 NOTE — Therapy (Signed)
OUTPATIENT PHYSICAL THERAPY TREATMENT NOTE   Patient Name: [name] MRN: [id number] DOB:08/25/1980, 41 y.o., male Today's Date: 09/25/2021  PCP: None per patient REFERRING PROVIDER: Lucas Mallow, MD  END OF SESSION:   PT End of Session - 09/25/21 0926     Visit Number 2    Number of Visits 16    Date for PT Re-Evaluation 12/11/21    Authorization Type Medicare/medicaid    PT Start Time 0925    PT Stop Time 1011    PT Time Calculation (min) 46 min    Activity Tolerance Patient tolerated treatment well    Behavior During Therapy [facility] for tasks assessed/performed             Past Medical History:  Diagnosis Date   Anxiety    Headache    History of suicidal ideation    Schizophrenia ([place])    Past Surgical History:  Procedure Laterality Date   APPENDECTOMY     MASS EXCISION Right 06/18/2020   Procedure: Excision right arm dermatofibrosarcoma protuberans;  Surgeon: Cindra Presume, MD;  Location: [place];  Service: Plastics;  Laterality: Right;  needs RNFA   ORIF TOE FRACTURE Right 01/04/2019   Procedure: OPEN REDUCTION INTERNAL FIXATION (ORIF) RIGHT 5TH METATARSAL FRACTURE;  Surgeon: Leandrew Koyanagi, MD;  Location: [place];  Service: Orthopedics;  Laterality: Right;   TIBIA FRACTURE SURGERY     right   Patient Active Problem List   Diagnosis Date Noted   Painful orthopaedic hardware ([place]) 02/22/2020   Closed fracture of base of fifth metatarsal bone of right foot at metaphyseal-diaphyseal junction 12/30/2018   Schizoaffective disorder, depressive type ([place])    Schizophrenia ([place]) 03/09/2018   Schizophrenia, paranoid type ([place]) 10/11/2017   Overdose 03/05/2015   Seizures ([facility])    Tachycardia    Right shoulder pain     REFERRING DIAG: R30.0 (ICD-10-CM) - Dysuria N34.2 (ICD-10-CM) - Other urethritis  THERAPY DIAG:  Muscle weakness (generalized)  Abnormal posture  Other muscle spasm  Rationale for Evaluation and  Treatment Rehabilitation  PERTINENT HISTORY: Schizophrenia; chronic headaches  PRECAUTIONS: None  SUBJECTIVE: My bladder is emptying without pain and I am just drinking less water and not pushing.  No pain with that and my stream is strong, I don't feel as bloated.   PAIN:  Are you having pain? No   OBJECTIVE: (objective measures completed at initial evaluation unless otherwise dated) DIAGNOSTIC FINDINGS:  Negative for any    COGNITION:            Overall cognitive status: Within functional limits for tasks assessed                            MUSCLE LENGTH: Hamstrings: Right wfl deg; Left wfl deg     LUMBAR SPECIAL TESTS:  Straight leg raise test: Negative Active straight leg raise - increased pelvic shifting with SLR improved with stabilizing core FUNCTIONAL TESTS:  Single leg stand - wfl                         Increased lordosis with squatting GAIT:   Comments: wfl               POSTURE: rounded shoulders, increased lumbar lordosis, increased thoracic kyphosis, and anterior pelvic tilt               PELVIC ALIGNMENT:  LUMBARAROM/PROM   A/PROM A/PROM  eval  Flexion    Extension    Right lateral flexion    Left lateral flexion    Right rotation    Left rotation     (Blank rows = not tested)   LOWER EXTREMITY AROM/PROM:   A/PROM Right eval Left eval  Hip flexion      Hip extension      Hip abduction      Hip adduction      Hip internal rotation      Hip external rotation      Knee flexion      Knee extension      Ankle dorsiflexion      Ankle plantarflexion      Ankle inversion      Ankle eversion       (Blank rows = not tested)   LOWER EXTREMITY MMT: Core strength 4/5; hip 5/5 bil   PALPATION: GENERAL tight paraspinals lumbar and thoracic               External Perineal Exam deferred               Internal Pelvic Floor deferred Patient confirms identification and approves PT to assess internal pelvic floor and treatment No   PELVIC  KAJ:[id number]   MMT eval  Internal Anal Sphincter    External Anal Sphincter    Puborectalis    Diastasis Recti    (Blank rows = not tested)     TONE:     TODAY'S TREATMENT  Treatment:09/25/21 Exercises  Hip rotation SKTC Happy baby Hamstring stretch Thoracic ext in supine with noodle; standing at wall thoracic and lumbar Child pose knees together and knees apart  Manual Not performed today  [place] Re-ed Education and cues for coordination of breathing and pelvic floor muscle relaxing and bulging; diaphragmatic breathing       PATIENT EDUCATION:  Education details: educated on not drinking more than a gallon of water per day and stop at least 1 hour before bed Person educated: patient Education method: verbal Education comprehension: verbalizes comprehension     HOME EXERCISE PROGRAM:     ASSESSMENT:   CLINICAL IMPRESSION: Today's session was used to establish HEP.  Pt was given stretches and breathing techniques for diaphragm breathing.  Pt did well with stretches.  Pt will benefit from skilled PT to address impairments and improve function without pain.     OBJECTIVE IMPAIRMENTS decreased strength, increased muscle spasms, postural dysfunction, and pain.    ACTIVITY LIMITATIONS sleeping and toileting   PARTICIPATION LIMITATIONS: interpersonal relationship   PERSONAL FACTORS 3+ comorbidities: schizophrenia, headaches, anxiety  are also affecting patient's functional outcome.    REHAB POTENTIAL: Excellent   CLINICAL DECISION MAKING: Stable/uncomplicated   EVALUATION COMPLEXITY: Low     GOALS: Goals reviewed with patient? Yes   SHORT TERM GOALS: Target date: 10/16/2021    Pt will know how to practice healthy bladder habits Baseline: Goal status: Met 09/25/21        LONG TERM GOALS: Target date: 12/11/2021    Pt will be independent with advanced HEP to maintain improvements made throughout therapy   Baseline:  Goal status: INITIAL   2.  Pt  will be able to empty his bladder without pain or straining Baseline:  Goal status: INITIAL   3.  Pt will demonstrate core strength by lifting 10 lb correctly for 10 reps Baseline: increased lordosis with squatting Goal status: INITIAL   4.  Pt will have nocturia to 1x at most  Baseline:  Goal status: INITIAL         PLAN: PT FREQUENCY: 1x/week   PT DURATION: 8 weeks   PLANNED INTERVENTIONS: Therapeutic exercises, Therapeutic activity, Neuromuscular re-education, Balance training, Gait training, Patient/Family education, Joint mobilization, Dry Needling, Electrical stimulation, Cryotherapy, Moist heat, Taping, Biofeedback, and Manual therapy   PLAN FOR NEXT SESSION: STM and maybe dry needling to lumbar and thoracic ; stretch and deep breathing; f/u on HEP, transversus abdominus activation     Camillo Flaming Mylinh Cragg, PT 09/25/2021, 10:11 AM

## 2021-09-25 ENCOUNTER — Ambulatory Visit: Payer: 59 | Admitting: Physical Therapy

## 2021-09-25 ENCOUNTER — Encounter: Payer: Self-pay | Admitting: Physical Therapy

## 2021-09-25 DIAGNOSIS — M62838 Other muscle spasm: Secondary | ICD-10-CM

## 2021-09-25 DIAGNOSIS — R293 Abnormal posture: Secondary | ICD-10-CM

## 2021-09-25 DIAGNOSIS — M6281 Muscle weakness (generalized): Secondary | ICD-10-CM

## 2021-09-25 NOTE — Therapy (Unsigned)
OUTPATIENT PHYSICAL THERAPY TREATMENT NOTE   Patient Name: [name] MRN: [id number] DOB:[birth year], 41 y.o., male Today's Date: 10/02/2021  PCP: None  REFERRING PROVIDER: Aundra Dubin, PA-C  END OF SESSION:   Visit # 2 Start: 11:00am End: 11:42am  Past Medical History:  Diagnosis Date   Anxiety    Headache    History of suicidal ideation    Schizophrenia [facility])    Past Surgical History:  Procedure Laterality Date   APPENDECTOMY     MASS EXCISION Right 06/18/2020   Procedure: Excision right arm dermatofibrosarcoma protuberans;  Surgeon: Cindra Presume, MD;  Location: [place];  Service: Plastics;  Laterality: Right;  needs RNFA   ORIF TOE FRACTURE Right 01/04/2019   Procedure: OPEN REDUCTION INTERNAL FIXATION (ORIF) RIGHT 5TH METATARSAL FRACTURE;  Surgeon: Leandrew Koyanagi, MD;  Location: [place];  Service: Orthopedics;  Laterality: Right;   TIBIA FRACTURE SURGERY     right   Patient Active Problem List   Diagnosis Date Noted   Painful orthopaedic hardware ([place]) 02/22/2020   Closed fracture of base of fifth metatarsal bone of right foot at metaphyseal-diaphyseal junction 12/30/2018   Schizoaffective disorder, depressive type ([place])    Schizophrenia ([place]) 03/09/2018   Schizophrenia, paranoid type ([place]) 10/11/2017   Overdose 03/05/2015   Seizures ([facility])    Tachycardia    Right shoulder pain     REFERRING DIAG: Shoulder pain, unspecified chronicity, unspecified laterality [M25.519]  THERAPY DIAG:  Muscle weakness (generalized)  Abnormal posture  Other muscle spasm  Bilateral shoulder pain, unspecified chronicity  Rationale for Evaluation and Treatment Rehabilitation  PERTINENT HISTORY: Mass excision of R shoulder R ORIF Toe fracture  R Tibia Fracture sx  PRECAUTIONS: None  SUBJECTIVE: Pt states that he has been performing his HEP but feels as though he may have overdone it as he had some increased pain afterwards.    PAIN:  Are you having pain? yes  Pt reports 4/10 pain with OH movements and IR. He states that it radiates into his biceps. OBJECTIVE:    DIAGNOSTIC FINDINGS:  X-ray - with no significant findings.    PATIENT SURVEYS:  FOTO 47, with predicted 70 in 12 visits.    COGNITION: Overall cognitive status: Within functional limits for tasks assessed                                  SENSATION: WFL   POSTURE: Rounded shoulders, forward head.    UPPER EXTREMITY ROM:    Active ROM Right eval Left eval  Shoulder flexion [facility]  Shoulder extension [facility]  Shoulder abduction [facility]  Shoulder adduction [facility]  Shoulder internal rotation [facility]  Shoulder external rotation WFL WFL  (Blank rows = not tested)   UPPER EXTREMITY MMT:   MMT Right eval Left eval  Shoulder flexion 4- 4-  Shoulder extension      Shoulder abduction 4 4  Shoulder internal rotation 3+ P! 4+  Shoulder external rotation 4 4+  (Blank rows = not tested)   SHOULDER SPECIAL TESTS: Impingement tests: Neer impingement test: negative and Hawkins/Kennedy impingement test: negative SLAP lesions: Biceps load test: negative Instability tests: Load and shift test: positive  and Apprehension test: positive  on R Rotator cuff assessment: Drop arm test: negative, External rotation lag sign: negative, Internal rotation lag sign: negative, and Belly press test: positive on R  Biceps assessment: Speed's test: negative   JOINT MOBILITY TESTING:  Pt has hypermobility in bilat shoulders.    PALPATION:  No tenderness with palpation.              TODAY'S TREATMENT:  [facility] Adult PT Treatment:                                                DATE: 09/26/2021 Therapeutic Exercise: UBE 2.5 fwd/ 2.5 Bkwd Rows with 10lbs on Matrix 2 x10  Extension with 10lbs on Matrix 2 x10  ER with YTB 2 x10 with towel roll IR with GTB 2 x10  with towel roll Lat pull down 40lbs 2 x 10  Body blade Bilat to fatigue x2 with elbow by their  side and shoulder flexion.     PATIENT EDUCATION: Education details: Educated pt on anatomy and physiology of current symptoms, NDI questionnaire, diagnosis, prognosis, HEP,  and POC. Person educated: Patient Education method: Explanation, Demonstration, Tactile cues, Verbal cues, and Handouts Education comprehension: verbalized understanding     HOME EXERCISE PROGRAM: Access Code: [email or web address] URL: [email or web address] Date: 09/19/2021 Prepared by: Rudi Heap   Exercises - Seated Scapular Retraction  - 2 x daily - 7 x weekly - 2 sets - 10 reps - Shoulder extension with resistance - Neutral  - 2 x daily - 7 x weekly - 2 sets - 10 reps - Shoulder External Rotation and Scapular Retraction with Resistance  - 2 x daily - 7 x weekly - 2 sets - 10 reps - Standing Shoulder Row with Anchored Resistance  - 2 x daily - 7 x weekly - 2 sets - 10 reps   ASSESSMENT:   CLINICAL IMPRESSION: Patient is a 41 y.o. M who presents with increased pain today. Session iniatted on UBE for increased mobility and peripheral blood flow. Reviewed HEP with modifications made with resistance and forum as needed. Pt tolerated session well with no increase in pain noted.    OBJECTIVE IMPAIRMENTS decreased ROM, decreased strength, and improper body mechanics.    ACTIVITY LIMITATIONS carrying, lifting, and reach over head   PARTICIPATION LIMITATIONS: driving, community activity, and smoking.    PERSONAL FACTORS Behavior pattern, Past/current experiences, Time since onset of injury/illness/exacerbation, and 1 comorbidity: Anxiety, hx of suicidal, schizoprenia   are also affecting patient's functional outcome.    REHAB POTENTIAL: Excellent   CLINICAL DECISION MAKING: Stable/uncomplicated   EVALUATION COMPLEXITY: Low     GOALS: Goals reviewed with patient? No   SHORT TERM GOALS: Target date: 10/17/2021     Pt will be I and compliant with initial HEP. Baseline:  Goal status: INITIAL  Patient  reports his pain does not get higher than 3/10 with activity.  Baseline:  Goal status: INITIAL   LONG TERM GOALS: Target date: 11/14/2021  (Remove Blue Hyperlink)   Pt will be independent with advanced HEP to continue to address postural limitations and muscle imbalances. Baseline: not provided Goal status: INITIAL Pt will report <2 pain in bilat shoulder with activity.  Baseline:  Goal status: INITIAL Pt will demonstrate 5/5 strength for all muscle groups.  Baseline:  Goal status: INITIAL 2.  Pt will will demonstrate negative apprehension with R UE test.  Baseline:  Goal status: INITIAL     PLAN: PT FREQUENCY: 1-2x/week   PT DURATION: 8 weeks   PLANNED INTERVENTIONS: Therapeutic exercises,  Therapeutic activity, Neuromuscular re-education, Balance training, Gait training, Patient/Family education, Joint manipulation, Joint mobilization, Dry Needling, Electrical stimulation, Spinal manipulation, Spinal mobilization, Cryotherapy, Moist heat, Taping, Traction, Ultrasound, and Manual therapy   PLAN FOR NEXT SESSION: Assess HEP/update PRN, continue to progress with periscapular strengthening exercises, monitor pain and mobility. Increase pt's functional mobility.        Lynden Ang, PT 10/02/2021, 12:58 PM

## 2021-09-26 ENCOUNTER — Ambulatory Visit: Payer: 59 | Admitting: Physical Therapy

## 2021-09-26 DIAGNOSIS — M62838 Other muscle spasm: Secondary | ICD-10-CM

## 2021-09-26 DIAGNOSIS — M25511 Pain in right shoulder: Secondary | ICD-10-CM | POA: Diagnosis not present

## 2021-09-26 DIAGNOSIS — M6281 Muscle weakness (generalized): Secondary | ICD-10-CM

## 2021-09-26 DIAGNOSIS — R293 Abnormal posture: Secondary | ICD-10-CM

## 2021-09-30 ENCOUNTER — Ambulatory Visit: Payer: 59

## 2021-09-30 DIAGNOSIS — M25511 Pain in right shoulder: Secondary | ICD-10-CM

## 2021-09-30 DIAGNOSIS — M62838 Other muscle spasm: Secondary | ICD-10-CM

## 2021-09-30 DIAGNOSIS — M6281 Muscle weakness (generalized): Secondary | ICD-10-CM

## 2021-09-30 DIAGNOSIS — R293 Abnormal posture: Secondary | ICD-10-CM

## 2021-10-02 ENCOUNTER — Ambulatory Visit: Payer: 59

## 2021-10-02 ENCOUNTER — Ambulatory Visit: Payer: 59 | Admitting: Physical Therapy

## 2021-10-02 DIAGNOSIS — M62838 Other muscle spasm: Secondary | ICD-10-CM | POA: Diagnosis not present

## 2021-10-02 DIAGNOSIS — R293 Abnormal posture: Secondary | ICD-10-CM

## 2021-10-02 DIAGNOSIS — M6281 Muscle weakness (generalized): Secondary | ICD-10-CM

## 2021-10-02 NOTE — Therapy (Signed)
OUTPATIENT PHYSICAL THERAPY TREATMENT NOTE   Patient Name: [name] MRN: [id number] DOB:01-10-1981, 41 y.o., male Today's Date: 10/02/2021  PCP: None per patient REFERRING PROVIDER: Crista Elliot, MD  END OF SESSION:     Past Medical History:  Diagnosis Date   Anxiety    Headache    History of suicidal ideation    Schizophrenia [facility])    Past Surgical History:  Procedure Laterality Date   APPENDECTOMY     MASS EXCISION Right 06/18/2020   Procedure: Excision right arm dermatofibrosarcoma protuberans;  Surgeon: Allena Napoleon, MD;  Location: [facility];  Service: Plastics;  Laterality: Right;  needs RNFA   ORIF TOE FRACTURE Right 01/04/2019   Procedure: OPEN REDUCTION INTERNAL FIXATION (ORIF) RIGHT 5TH METATARSAL FRACTURE;  Surgeon: Tarry Kos, MD;  Location: [facility];  Service: Orthopedics;  Laterality: Right;   TIBIA FRACTURE SURGERY     right   Patient Active Problem List   Diagnosis Date Noted   Painful orthopaedic hardware (HCC) 02/22/2020   Closed fracture of base of fifth metatarsal bone of right foot at metaphyseal-diaphyseal junction 12/30/2018   Schizoaffective disorder, depressive type (H[facility])    Schizophrenia (HCC) 03/09/2018   Schizophrenia, paranoid type ([facility]) 10/11/2017   Overdose 03/05/2015   Seizures (HCC)    Tachycardia    Right shoulder pain     REFERRING DIAG: R30.0 (ICD-10-CM) - Dysuria N34.2 (ICD-10-CM) - Other urethritis  THERAPY DIAG:  No diagnosis found.  Rationale for Evaluation and Treatment Rehabilitation  PERTINENT HISTORY: Schizophrenia; chronic headaches  PRECAUTIONS: None  SUBJECTIVE: I had a little bit of pain on the tip of the penis ealier this week at nighttime   PAIN:  Are you having pain? No   OBJECTIVE: (objective measures completed at initial evaluation unless otherwise dated) DIAGNOSTIC FINDINGS:  Negative for any    COGNITION:            Overall cognitive status: Within  functional limits for tasks assessed                            MUSCLE LENGTH: Hamstrings: Right wfl deg; Left wfl deg     LUMBAR SPECIAL TESTS:  Straight leg raise test: Negative Active straight leg raise - increased pelvic shifting with SLR improved with stabilizing core FUNCTIONAL TESTS:  Single leg stand - wfl                         Increased lordosis with squatting GAIT:   Comments: wfl               POSTURE: rounded shoulders, increased lumbar lordosis, increased thoracic kyphosis, and anterior pelvic tilt               PELVIC ALIGNMENT:   LUMBARAROM/PROM   A/PROM A/PROM  eval  Flexion    Extension    Right lateral flexion    Left lateral flexion    Right rotation    Left rotation     (Blank rows = not tested)   LOWER EXTREMITY AROM/PROM:   A/PROM Right eval Left eval  Hip flexion      Hip extension      Hip abduction      Hip adduction      Hip internal rotation      Hip external rotation      Knee flexion  Knee extension      Ankle dorsiflexion      Ankle plantarflexion      Ankle inversion      Ankle eversion       (Blank rows = not tested)   LOWER EXTREMITY MMT: Core strength 4/5; hip 5/5 bil   PALPATION: GENERAL tight paraspinals lumbar and thoracic               External Perineal Exam deferred               Internal Pelvic Floor deferred Patient confirms identification and approves PT to assess internal pelvic floor and treatment No   PELVIC MPN:[email or web address]   MMT eval  Internal Anal Sphincter    External Anal Sphincter    Puborectalis    Diastasis Recti    (Blank rows = not tested)     TONE:     TODAY'S TREATMENT  Treatment:10/02/21 Exercises  Hip rotation - 1 min Butterfly stretch - 1 min SKTC - had shoulder pain Happy baby - had shoulder pain Thoracic ext in supine with towel - UE overhead 8x Child pose knees knees apart - 2 x 30 sec Rock pose - 2 x 30 sec Transversus abdominus activation in supine - making "s" sound  and exhale with gentle contraction - pt needed a lot of cues to keep from bulging abdomen Manual Lumbar and thoracic STM - discussed dry needling for next time  Nuero Re-ed Education and cues for coordination of breathing and pelvic floor muscle relaxing and bulging; diaphragmatic breathing Transversus abdominus activation  Treatment:09/25/21 Exercises  Hip rotation SKTC Happy baby Hamstring stretch Thoracic ext in supine with noodle; standing at wall thoracic and lumbar Child pose knees together and knees apart  Manual Not performed today  Nuero Re-ed Education and cues for coordination of breathing and pelvic floor muscle relaxing and bulging; diaphragmatic breathing       PATIENT EDUCATION:  Education details: educated on not drinking more than a gallon of water per day and stop at least 1 hour before bed Person educated: patient Education method: verbal Education comprehension: verbalizes comprehension     HOME EXERCISE PROGRAM: Access Code: [email or web address] URL: [email or web address] Date: 10/02/2021 Prepared by: Jari Favre  Exercises - Supine Hip Internal and External Rotation  - 1 x daily - 7 x weekly - 1 sets - 10 reps - 5 sec hold - Supine Butterfly Groin Stretch  - 1 x daily - 7 x weekly - 3 sets - 10 reps - Quadruped Thoracic Spine Extension  - 1 x daily - 7 x weekly - 3 sets - 10 reps - Supine 90/90 Shoulder Flexion with Abdominal Bracing  - 1 x daily - 7 x weekly - 3 sets - 10 reps  Patient Education - Trigger Point Dry Needling    ASSESSMENT:   CLINICAL IMPRESSION: Today's session focus was to review HEP.  Pt was given stretches and breathing techniques for diaphragm breathing.  Pt did well with addition of STM and transversus abdominus activation. He has tension in deep lumbar multifidi and would benefit from skilled PT to address using dry needling but was just educated on this today.  STM did release some and increased muscle length.  Pt given updates to HEP as seen. Needing a fair amount of cues to activate the transversus abdominus.  Pt will benefit from skilled PT to address impairments and improve function without pain.     OBJECTIVE IMPAIRMENTS decreased strength, increased muscle spasms, postural  dysfunction, and pain.    ACTIVITY LIMITATIONS sleeping and toileting   PARTICIPATION LIMITATIONS: interpersonal relationship   PERSONAL FACTORS 3+ comorbidities: schizophrenia, headaches, anxiety  are also affecting patient's functional outcome.    REHAB POTENTIAL: Excellent   CLINICAL DECISION MAKING: Stable/uncomplicated   EVALUATION COMPLEXITY: Low     GOALS: Goals reviewed with patient? Yes   SHORT TERM GOALS: Target date: 10/16/2021    Pt will know how to practice healthy bladder habits Baseline: Goal status: Met 09/25/21        LONG TERM GOALS: Target date: 12/11/2021   updated 10/02/21  Pt will be independent with advanced HEP to maintain improvements made throughout therapy   Baseline:  Goal status: Ongoing   2.  Pt will be able to empty his bladder without pain or straining Baseline: the past couple of weeks no issues  Goal status: Met   3.  Pt will demonstrate core strength by lifting 10 lb correctly for 10 reps Baseline: increased lordosis with squatting Goal status: Ongoing   4.  Pt will have nocturia to 1x at most  Baseline: 1-2/night Goal status: Ongoing         PLAN: PT FREQUENCY: 1x/week   PT DURATION: 8 weeks   PLANNED INTERVENTIONS: Therapeutic exercises, Therapeutic activity, Neuromuscular re-education, Balance training, Gait training, Patient/Family education, Joint mobilization, Dry Needling, Electrical stimulation, Cryotherapy, Moist heat, Taping, Biofeedback, and Manual therapy   PLAN FOR NEXT SESSION: STM and maybe dry needling to lumbar and thoracic ; stretch and deep breathing; f/u on HEP, transversus abdominus activation     Camillo Flaming Avriel Kandel, PT 10/02/2021,  11:32 AM

## 2021-10-06 ENCOUNTER — Encounter: Payer: Self-pay | Admitting: Physical Therapy

## 2021-10-06 ENCOUNTER — Ambulatory Visit: Payer: 59 | Attending: Physician Assistant | Admitting: Physical Therapy

## 2021-10-06 DIAGNOSIS — M25511 Pain in right shoulder: Secondary | ICD-10-CM | POA: Insufficient documentation

## 2021-10-06 DIAGNOSIS — R293 Abnormal posture: Secondary | ICD-10-CM | POA: Insufficient documentation

## 2021-10-06 DIAGNOSIS — M6281 Muscle weakness (generalized): Secondary | ICD-10-CM | POA: Diagnosis present

## 2021-10-06 DIAGNOSIS — M62838 Other muscle spasm: Secondary | ICD-10-CM | POA: Diagnosis present

## 2021-10-06 DIAGNOSIS — M25512 Pain in left shoulder: Secondary | ICD-10-CM | POA: Diagnosis present

## 2021-10-06 NOTE — Therapy (Addendum)
OUTPATIENT PHYSICAL THERAPY TREATMENT NOTE   Patient Name: [name] MRN: [id number] DOB:1980-11-12, 41 y.o., male Today's Date: 10/06/2021  PCP: None per patient REFERRING PROVIDER: Lucas Mallow, MD  END OF SESSION:   PT End of Session - 10/06/21 1529     Visit Number 4    Number of Visits 16    Date for PT Re-Evaluation 11/14/21    Authorization Type Medicare/medicaid    PT Start Time 1529    PT Stop Time 1612    PT Time Calculation (min) 43 min    Activity Tolerance Patient tolerated treatment well    Behavior During Therapy WFL for tasks assessed/performed              Past Medical History:  Diagnosis Date   Anxiety    Headache    History of suicidal ideation    Schizophrenia ([place])    Past Surgical History:  Procedure Laterality Date   APPENDECTOMY     MASS EXCISION Right 06/18/2020   Procedure: Excision right arm dermatofibrosarcoma protuberans;  Surgeon: Cindra Presume, MD;  Location: [place];  Service: Plastics;  Laterality: Right;  needs RNFA   ORIF TOE FRACTURE Right 01/04/2019   Procedure: OPEN REDUCTION INTERNAL FIXATION (ORIF) RIGHT 5TH METATARSAL FRACTURE;  Surgeon: Leandrew Koyanagi, MD;  Location: [place];  Service: Orthopedics;  Laterality: Right;   TIBIA FRACTURE SURGERY     right   Patient Active Problem List   Diagnosis Date Noted   Painful orthopaedic hardware ([place]) 02/22/2020   Closed fracture of base of fifth metatarsal bone of right foot at metaphyseal-diaphyseal junction 12/30/2018   Schizoaffective disorder, depressive type ([place])    Schizophrenia ([place]) 03/09/2018   Schizophrenia, paranoid type ([place]) 10/11/2017   Overdose 03/05/2015   Seizures ([facility])    Tachycardia    Right shoulder pain     REFERRING DIAG: R30.0 (ICD-10-CM) - Dysuria N34.2 (ICD-10-CM) - Other urethritis  THERAPY DIAG:  Muscle weakness (generalized)  Abnormal posture  Other muscle spasm  Bilateral shoulder pain,  unspecified chronicity  Rationale for Evaluation and Treatment Rehabilitation  PERTINENT HISTORY: Schizophrenia; chronic headaches  PRECAUTIONS: None  SUBJECTIVE: Shoulder pain still after I exercise but I went to every other day and that was slightly better.   PAIN:  Are you having pain? No   OBJECTIVE: (objective measures completed at initial evaluation unless otherwise dated) DIAGNOSTIC FINDINGS:  Negative for any    COGNITION:            Overall cognitive status: Within functional limits for tasks assessed                            MUSCLE LENGTH: Hamstrings: Right wfl deg; Left wfl deg     LUMBAR SPECIAL TESTS:  Straight leg raise test: Negative Active straight leg raise - increased pelvic shifting with SLR improved with stabilizing core FUNCTIONAL TESTS:  Single leg stand - wfl                         Increased lordosis with squatting GAIT:   Comments: wfl               POSTURE: rounded shoulders, increased lumbar lordosis, increased thoracic kyphosis, and anterior pelvic tilt               PELVIC ALIGNMENT:   LUMBARAROM/PROM   A/PROM  A/PROM  eval  Flexion    Extension    Right lateral flexion    Left lateral flexion    Right rotation    Left rotation     (Blank rows = not tested)   LOWER EXTREMITY AROM/PROM:   A/PROM Right eval Left eval  Hip flexion      Hip extension      Hip abduction      Hip adduction      Hip internal rotation      Hip external rotation      Knee flexion      Knee extension      Ankle dorsiflexion      Ankle plantarflexion      Ankle inversion      Ankle eversion       (Blank rows = not tested)   LOWER EXTREMITY MMT: Core strength 4/5; hip 5/5 bil   PALPATION: GENERAL tight paraspinals lumbar and thoracic               External Perineal Exam deferred               Internal Pelvic Floor deferred Patient confirms identification and approves PT to assess internal pelvic floor and treatment No   PELVIC  YIR:[email or web address]   MMT eval  Internal Anal Sphincter    External Anal Sphincter    Puborectalis    Diastasis Recti    (Blank rows = not tested)     TONE:     TODAY'S TREATMENT  Treatment:10/06/21 UBE x 4 min L1 (fwd x 2:30 min, back x 2:30 min) Prone shoulder  horiz abduction 2x10 each on each UE with #1 Sidelying ER x 2x10 with 0# then 1# added for second set. Supine serratus punch x 20 with #1 Bil Standing rows blue band 2x10, green extensions 10x,  Ice post exercises to reduce post ex soreness  Treatment:10/02/21 Exercises  Hip rotation - 1 min Butterfly stretch - 1 min SKTC - had shoulder pain Happy baby - had shoulder pain Thoracic ext in supine with towel - UE overhead 8x Child pose knees knees apart - 2 x 30 sec Rock pose - 2 x 30 sec Transversus abdominus activation in supine - making "s" sound and exhale with gentle contraction - pt needed a lot of cues to keep from bulging abdomen Manual Lumbar and thoracic STM - discussed dry needling for next time  Nuero Re-ed Education and cues for coordination of breathing and pelvic floor muscle relaxing and bulging; diaphragmatic breathing Transversus abdominus activation  Treatment:09/25/21 Exercises  Hip rotation SKTC Happy baby Hamstring stretch Thoracic ext in supine with noodle; standing at wall thoracic and lumbar Child pose knees together and knees apart  Manual Not performed today  Nuero Re-ed Education and cues for coordination of breathing and pelvic floor muscle relaxing and bulging; diaphragmatic breathing       PATIENT EDUCATION:  Education details: educated on not drinking more than a gallon of water per day and stop at least 1 hour before bed Person educated: patient Education method: verbal Education comprehension: verbalizes comprehension     HOME EXERCISE PROGRAM: Access Code: [email or web address] URL: [email or web address] Date: 10/02/2021 Prepared by: Jari Favre  Exercises - Supine Hip Internal and External Rotation  - 1 x daily - 7 x weekly - 1 sets - 10 reps - 5 sec hold - Supine Butterfly Groin Stretch  - 1 x daily - 7 x weekly - 3 sets - 10 reps -  Quadruped Thoracic Spine Extension  - 1 x daily - 7 x weekly - 3 sets - 10 reps - Supine 90/90 Shoulder Flexion with Abdominal Bracing  - 1 x daily - 7 x weekly - 3 sets - 10 reps  Patient Education - Trigger Point Dry Needling    ASSESSMENT:   CLINICAL IMPRESSION: Pt arrives for shoulder PT today. He reports no current pain but still suffers. He has to do his HEP every other day to modulate his pain. Pt able to add 1# weights for Bil shoulder strength with no pain while performing them.    OBJECTIVE IMPAIRMENTS decreased strength, increased muscle spasms, postural dysfunction, and pain.    ACTIVITY LIMITATIONS sleeping and toileting   PARTICIPATION LIMITATIONS: interpersonal relationship   PERSONAL FACTORS 3+ comorbidities: schizophrenia, headaches, anxiety  are also affecting patient's functional outcome.    REHAB POTENTIAL: Excellent   CLINICAL DECISION MAKING: Stable/uncomplicated   EVALUATION COMPLEXITY: Low     GOALS: Goals reviewed with patient? Yes   SHORT TERM GOALS: Target date: 10/16/2021    Pt will know how to practice healthy bladder habits Baseline: Goal status: Met 09/25/21        LONG TERM GOALS: Target date: 12/11/2021   updated 10/02/21  Pt will be independent with advanced HEP to maintain improvements made throughout therapy   Baseline:  Goal status: Ongoing   2.  Pt will be able to empty his bladder without pain or straining Baseline: the past couple of weeks no issues  Goal status: Met   3.  Pt will demonstrate core strength by lifting 10 lb correctly for 10 reps Baseline: increased lordosis with squatting Goal status: Ongoing   4.  Pt will have nocturia to 1x at most  Baseline: 1-2/night Goal status: Ongoing         PLAN: PT  FREQUENCY: 1x/week   PT DURATION: 8 weeks   PLANNED INTERVENTIONS: Therapeutic exercises, Therapeutic activity, Neuromuscular re-education, Balance training, Gait training, Patient/Family education, Joint mobilization, Dry Needling, Electrical stimulation, Cryotherapy, Moist heat, Taping, Biofeedback, and Manual therapy   PLAN FOR NEXT SESSION: Pelvic session next    [facility], PTA 10/06/2021, 4:04 PM

## 2021-10-10 ENCOUNTER — Ambulatory Visit: Payer: 59 | Admitting: Physical Therapy

## 2021-10-10 ENCOUNTER — Encounter: Payer: Self-pay | Admitting: Physical Therapy

## 2021-10-10 DIAGNOSIS — M6281 Muscle weakness (generalized): Secondary | ICD-10-CM | POA: Diagnosis not present

## 2021-10-10 DIAGNOSIS — M62838 Other muscle spasm: Secondary | ICD-10-CM

## 2021-10-10 DIAGNOSIS — R293 Abnormal posture: Secondary | ICD-10-CM

## 2021-10-10 DIAGNOSIS — M25511 Pain in right shoulder: Secondary | ICD-10-CM

## 2021-10-10 NOTE — Therapy (Signed)
OUTPATIENT PHYSICAL THERAPY TREATMENT NOTE   Patient Name: [name] MRN: [id number] DOB:[birth year], 41 y.o., male Today's Date: 10/10/2021  PCP: None per patient REFERRING PROVIDER: Lucas Mallow, MD  END OF SESSION:   PT End of Session - 10/10/21 0931     Visit Number 5    Number of Visits 16    Date for PT Re-Evaluation 11/14/21    Authorization Type Medicare/medicaid    PT Start Time 0931    PT Stop Time 1015    PT Time Calculation (min) 44 min    Activity Tolerance Patient tolerated treatment well    Behavior During Therapy [facility] for tasks assessed/performed               Past Medical History:  Diagnosis Date   Anxiety    Headache    History of suicidal ideation    Schizophrenia ([place])    Past Surgical History:  Procedure Laterality Date   APPENDECTOMY     MASS EXCISION Right 06/18/2020   Procedure: Excision right arm dermatofibrosarcoma protuberans;  Surgeon: Cindra Presume, MD;  Location: [place];  Service: Plastics;  Laterality: Right;  needs RNFA   ORIF TOE FRACTURE Right 01/04/2019   Procedure: OPEN REDUCTION INTERNAL FIXATION (ORIF) RIGHT 5TH METATARSAL FRACTURE;  Surgeon: Leandrew Koyanagi, MD;  Location: [place];  Service: Orthopedics;  Laterality: Right;   TIBIA FRACTURE SURGERY     right   Patient Active Problem List   Diagnosis Date Noted   Painful orthopaedic hardware ([place]) 02/22/2020   Closed fracture of base of fifth metatarsal bone of right foot at metaphyseal-diaphyseal junction 12/30/2018   Schizoaffective disorder, depressive type ([place])    Schizophrenia ([place]) 03/09/2018   Schizophrenia, paranoid type ([place]) 10/11/2017   Overdose 03/05/2015   Seizures ([facility])    Tachycardia    Right shoulder pain     REFERRING DIAG: R30.0 (ICD-10-CM) - Dysuria N34.2 (ICD-10-CM) - Other urethritis  THERAPY DIAG:  Muscle weakness (generalized)  Abnormal posture  Other muscle spasm  Bilateral shoulder pain,  unspecified chronicity  Rationale for Evaluation and Treatment Rehabilitation  PERTINENT HISTORY: Schizophrenia; chronic headaches  PRECAUTIONS: None  SUBJECTIVE: No shoulder pain last 4 days.   PAIN:  Are you having pain? No   OBJECTIVE: (objective measures completed at initial evaluation unless otherwise dated) DIAGNOSTIC FINDINGS:  Negative for any    COGNITION:            Overall cognitive status: Within functional limits for tasks assessed                            MUSCLE LENGTH: Hamstrings: Right wfl deg; Left wfl deg     LUMBAR SPECIAL TESTS:  Straight leg raise test: Negative Active straight leg raise - increased pelvic shifting with SLR improved with stabilizing core FUNCTIONAL TESTS:  Single leg stand - wfl                         Increased lordosis with squatting GAIT:   Comments: wfl               POSTURE: rounded shoulders, increased lumbar lordosis, increased thoracic kyphosis, and anterior pelvic tilt               PELVIC ALIGNMENT:   LUMBARAROM/PROM   A/PROM A/PROM  eval  Flexion    Extension  Right lateral flexion    Left lateral flexion    Right rotation    Left rotation     (Blank rows = not tested)   LOWER EXTREMITY AROM/PROM:   A/PROM Right eval Left eval  Hip flexion      Hip extension      Hip abduction      Hip adduction      Hip internal rotation      Hip external rotation      Knee flexion      Knee extension      Ankle dorsiflexion      Ankle plantarflexion      Ankle inversion      Ankle eversion       (Blank rows = not tested)   LOWER EXTREMITY MMT: Core strength 4/5; hip 5/5 bil   PALPATION: GENERAL tight paraspinals lumbar and thoracic               External Perineal Exam deferred               Internal Pelvic Floor deferred Patient confirms identification and approves PT to assess internal pelvic floor and treatment No   PELVIC ULA:[email or web address]   MMT eval  Internal Anal Sphincter    External Anal  Sphincter    Puborectalis    Diastasis Recti    (Blank rows = not tested)     TONE:     TODAY'S TREATMENT   10/10/21: UBE x 3x3 min L1.5  Prone shoulder  horiz abduction 2x10 each on each UE with #2: Vc to soften effort  Sidelying ER x 2x10 with 1# 10x, then 2# on second set 10x Bil Supine serratus punch x 20 with #2 Bil Supine yellow Tband horizontal abd 10x Standing rows blue band 2x10, green extensions 12x, Initial VC to relax Upper traps Green loop at wrist with full shoulder flexion overhead 10x Purple ball in chair thoracic extensions 10x with neck support from his hands Ardine Eng pose 4 breaths Ice post exercises to reduce post ex soreness Bil shoulders  Treatment:10/06/21 UBE x 4 min L1 (fwd x 2:30 min, back x 2:30 min) Prone shoulder  horiz abduction 2x10 each on each UE with #1 Sidelying ER x 2x10 with 0# then 1# added for second set. Supine serratus punch x 20 with #1 Bil Standing rows blue band 2x10, green extensions 10x,  Ice post exercises to reduce post ex soreness Bil shoulders  Treatment:10/02/21 Exercises  Hip rotation - 1 min Butterfly stretch - 1 min SKTC - had shoulder pain Happy baby - had shoulder pain Thoracic ext in supine with towel - UE overhead 8x Child pose knees knees apart - 2 x 30 sec Rock pose - 2 x 30 sec Transversus abdominus activation in supine - making "s" sound and exhale with gentle contraction - pt needed a lot of cues to keep from bulging abdomen Manual Lumbar and thoracic STM - discussed dry needling for next time  Nuero Re-ed Education and cues for coordination of breathing and pelvic floor muscle relaxing and bulging; diaphragmatic breathing Transversus abdominus activation  Treatment:09/25/21 Exercises  Hip rotation SKTC Happy baby Hamstring stretch Thoracic ext in supine with noodle; standing at wall thoracic and lumbar Child pose knees together and knees apart  Manual Not performed today  Nuero Re-ed Education and  cues for coordination of breathing and pelvic floor muscle relaxing and bulging; diaphragmatic breathing       PATIENT EDUCATION:  Education details: educated  on not drinking more than a gallon of water per day and stop at least 1 hour before bed Person educated: patient Education method: verbal Education comprehension: verbalizes comprehension     HOME EXERCISE PROGRAM: Access Code: [email or web address] URL: [email or web address] Date: 10/02/2021 Prepared by: Jari Favre  Exercises - Supine Hip Internal and External Rotation  - 1 x daily - 7 x weekly - 1 sets - 10 reps - 5 sec hold - Supine Butterfly Groin Stretch  - 1 x daily - 7 x weekly - 3 sets - 10 reps - Quadruped Thoracic Spine Extension  - 1 x daily - 7 x weekly - 3 sets - 10 reps - Supine 90/90 Shoulder Flexion with Abdominal Bracing  - 1 x daily - 7 x weekly - 3 sets - 10 reps  Patient Education - Trigger Point Dry Needling   Added on 10/10/21: Access Code: [email or web address] URL: [email or web address] Date: 10/10/2021 Prepared by: Myrene Galas  Exercises - Seated Thoracic Lumbar Extension with Pectoralis Stretch  - 2 x daily - 7 x weekly - 1 sets - 10 reps - Child's Pose Stretch  - 2 x daily - 7 x weekly - 1 sets - 60 hold - Sidelying Shoulder ER with Towel and Dumbbell  - 1 x daily - 7 x weekly - 2 sets - 10 reps  Patient Education - Trigger Point Dry Needling   ASSESSMENT:   CLINICAL IMPRESSION: Pt arrives to PT with no complaints of pain. Pt reports no shoulder pain last 4 days. Pt was able to increase his resistance again today and perform his There-ex pain free. Min verbal cues for technique required. Pt reports he feels his posture and postural awareness is greatly improving. Added to HEP today per pt request.    OBJECTIVE IMPAIRMENTS decreased strength, increased muscle spasms, postural dysfunction, and pain.    ACTIVITY LIMITATIONS sleeping and toileting   PARTICIPATION  LIMITATIONS: interpersonal relationship   PERSONAL FACTORS 3+ comorbidities: schizophrenia, headaches, anxiety  are also affecting patient's functional outcome.    REHAB POTENTIAL: Excellent   CLINICAL DECISION MAKING: Stable/uncomplicated   EVALUATION COMPLEXITY: Low     GOALS: Goals reviewed with patient? Yes   SHORT TERM GOALS: Target date: 10/16/2021    Pt will know how to practice healthy bladder habits Baseline: Goal status: Met 09/25/21        LONG TERM GOALS: Target date: 12/11/2021   updated 10/02/21  Pt will be independent with advanced HEP to maintain improvements made throughout therapy   Baseline:  Goal status: Ongoing   2.  Pt will be able to empty his bladder without pain or straining Baseline: the past couple of weeks no issues  Goal status: Met   3.  Pt will demonstrate core strength by lifting 10 lb correctly for 10 reps Baseline: increased lordosis with squatting Goal status: Ongoing   4.  Pt will have nocturia to 1x at most  Baseline: 1-2/night Goal status: Ongoing         PLAN: PT FREQUENCY: 1x/week   PT DURATION: 8 weeks   PLANNED INTERVENTIONS: Therapeutic exercises, Therapeutic activity, Neuromuscular re-education, Balance training, Gait training, Patient/Family education, Joint mobilization, Dry Needling, Electrical stimulation, Cryotherapy, Moist heat, Taping, Biofeedback, and Manual therapy   PLAN FOR NEXT SESSION: Pelvic session next    Kiing Deakin, PTA 10/10/2021, 10:14 AM

## 2021-10-10 NOTE — Therapy (Deleted)
OUTPATIENT PHYSICAL THERAPY TREATMENT NOTE   Patient Name: [name] MRN: [id number] DOB:[birth year], 41 y.o., male Today's Date: 10/10/2021  PCP: None per patient REFERRING PROVIDER: Lucas Mallow, MD  END OF SESSION:     Past Medical History:  Diagnosis Date   Anxiety    Headache    History of suicidal ideation    Schizophrenia [facility])    Past Surgical History:  Procedure Laterality Date   APPENDECTOMY     MASS EXCISION Right 06/18/2020   Procedure: Excision right arm dermatofibrosarcoma protuberans;  Surgeon: Cindra Presume, MD;  Location: [place];  Service: Plastics;  Laterality: Right;  needs RNFA   ORIF TOE FRACTURE Right 01/04/2019   Procedure: OPEN REDUCTION INTERNAL FIXATION (ORIF) RIGHT 5TH METATARSAL FRACTURE;  Surgeon: Leandrew Koyanagi, MD;  Location: [place];  Service: Orthopedics;  Laterality: Right;   TIBIA FRACTURE SURGERY     right   Patient Active Problem List   Diagnosis Date Noted   Painful orthopaedic hardware ([place]) 02/22/2020   Closed fracture of base of fifth metatarsal bone of right foot at metaphyseal-diaphyseal junction 12/30/2018   Schizoaffective disorder, depressive type ([place])    Schizophrenia ([place]) 03/09/2018   Schizophrenia, paranoid type ([place]) 10/11/2017   Overdose 03/05/2015   Seizures ([place])    Tachycardia    Right shoulder pain     REFERRING DIAG: R30.0 (ICD-10-CM) - Dysuria N34.2 (ICD-10-CM) - Other urethritis  THERAPY DIAG:  No diagnosis found.  Rationale for Evaluation and Treatment Rehabilitation  PERTINENT HISTORY: Schizophrenia; chronic headaches  PRECAUTIONS: None  SUBJECTIVE: I had a little bit of pain on the tip of the penis ealier this week at nighttime   PAIN:  Are you having pain? No   OBJECTIVE: (objective measures completed at initial evaluation unless otherwise dated) DIAGNOSTIC FINDINGS:  Negative for any    COGNITION:            Overall cognitive status: Within  functional limits for tasks assessed                            MUSCLE LENGTH: Hamstrings: Right wfl deg; Left wfl deg     LUMBAR SPECIAL TESTS:  Straight leg raise test: Negative Active straight leg raise - increased pelvic shifting with SLR improved with stabilizing core FUNCTIONAL TESTS:  Single leg stand - wfl                         Increased lordosis with squatting GAIT:   Comments: wfl               POSTURE: rounded shoulders, increased lumbar lordosis, increased thoracic kyphosis, and anterior pelvic tilt               PELVIC ALIGNMENT:   LUMBARAROM/PROM   A/PROM A/PROM  eval  Flexion    Extension    Right lateral flexion    Left lateral flexion    Right rotation    Left rotation     (Blank rows = not tested)   LOWER EXTREMITY AROM/PROM:   A/PROM Right eval Left eval  Hip flexion      Hip extension      Hip abduction      Hip adduction      Hip internal rotation      Hip external rotation      Knee flexion  Knee extension      Ankle dorsiflexion      Ankle plantarflexion      Ankle inversion      Ankle eversion       (Blank rows = not tested)   LOWER EXTREMITY MMT: Core strength 4/5; hip 5/5 bil   PALPATION: GENERAL tight paraspinals lumbar and thoracic               External Perineal Exam deferred               Internal Pelvic Floor deferred Patient confirms identification and approves PT to assess internal pelvic floor and treatment No   PELVIC MPN:[email or web address]   MMT eval  Internal Anal Sphincter    External Anal Sphincter    Puborectalis    Diastasis Recti    (Blank rows = not tested)     TONE:     TODAY'S TREATMENT  Treatment:10/02/21 Exercises  Hip rotation - 1 min Butterfly stretch - 1 min SKTC - had shoulder pain Happy baby - had shoulder pain Thoracic ext in supine with towel - UE overhead 8x Child pose knees knees apart - 2 x 30 sec Rock pose - 2 x 30 sec Transversus abdominus activation in supine - making "s" sound  and exhale with gentle contraction - pt needed a lot of cues to keep from bulging abdomen Manual Lumbar and thoracic STM - discussed dry needling for next time  Nuero Re-ed Education and cues for coordination of breathing and pelvic floor muscle relaxing and bulging; diaphragmatic breathing Transversus abdominus activation  Treatment:09/25/21 Exercises  Hip rotation SKTC Happy baby Hamstring stretch Thoracic ext in supine with noodle; standing at wall thoracic and lumbar Child pose knees together and knees apart  Manual Not performed today  Nuero Re-ed Education and cues for coordination of breathing and pelvic floor muscle relaxing and bulging; diaphragmatic breathing       PATIENT EDUCATION:  Education details: educated on not drinking more than a gallon of water per day and stop at least 1 hour before bed Person educated: patient Education method: verbal Education comprehension: verbalizes comprehension     HOME EXERCISE PROGRAM: Access Code: [email or web address] URL: [email or web address] Date: 10/02/2021 Prepared by: Jari Favre  Exercises - Supine Hip Internal and External Rotation  - 1 x daily - 7 x weekly - 1 sets - 10 reps - 5 sec hold - Supine Butterfly Groin Stretch  - 1 x daily - 7 x weekly - 3 sets - 10 reps - Quadruped Thoracic Spine Extension  - 1 x daily - 7 x weekly - 3 sets - 10 reps - Supine 90/90 Shoulder Flexion with Abdominal Bracing  - 1 x daily - 7 x weekly - 3 sets - 10 reps  Patient Education - Trigger Point Dry Needling    ASSESSMENT:   CLINICAL IMPRESSION: Today's session focus was to review HEP.  Pt was given stretches and breathing techniques for diaphragm breathing.  Pt did well with addition of STM and transversus abdominus activation. He has tension in deep lumbar multifidi and would benefit from skilled PT to address using dry needling but was just educated on this today.  STM did release some and increased muscle length.  Pt given updates to HEP as seen. Needing a fair amount of cues to activate the transversus abdominus.  Pt will benefit from skilled PT to address impairments and improve function without pain.     OBJECTIVE IMPAIRMENTS decreased strength, increased muscle spasms, postural  dysfunction, and pain.    ACTIVITY LIMITATIONS sleeping and toileting   PARTICIPATION LIMITATIONS: interpersonal relationship   PERSONAL FACTORS 3+ comorbidities: schizophrenia, headaches, anxiety  are also affecting patient's functional outcome.    REHAB POTENTIAL: Excellent   CLINICAL DECISION MAKING: Stable/uncomplicated   EVALUATION COMPLEXITY: Low     GOALS: Goals reviewed with patient? Yes   SHORT TERM GOALS: Target date: 10/16/2021    Pt will know how to practice healthy bladder habits Baseline: Goal status: Met 09/25/21        LONG TERM GOALS: Target date: 12/11/2021   updated 10/02/21  Pt will be independent with advanced HEP to maintain improvements made throughout therapy   Baseline:  Goal status: Ongoing   2.  Pt will be able to empty his bladder without pain or straining Baseline: the past couple of weeks no issues  Goal status: Met   3.  Pt will demonstrate core strength by lifting 10 lb correctly for 10 reps Baseline: increased lordosis with squatting Goal status: Ongoing   4.  Pt will have nocturia to 1x at most  Baseline: 1-2/night Goal status: Ongoing         PLAN: PT FREQUENCY: 1x/week   PT DURATION: 8 weeks   PLANNED INTERVENTIONS: Therapeutic exercises, Therapeutic activity, Neuromuscular re-education, Balance training, Gait training, Patient/Family education, Joint mobilization, Dry Needling, Electrical stimulation, Cryotherapy, Moist heat, Taping, Biofeedback, and Manual therapy   PLAN FOR NEXT SESSION: STM and maybe dry needling to lumbar and thoracic ; stretch and deep breathing; f/u on HEP, transversus abdominus activation     Camillo Flaming Sherrell Farish, PT 10/10/2021,  11:55 AM

## 2021-10-13 ENCOUNTER — Ambulatory Visit: Payer: 59 | Admitting: Physical Therapy

## 2021-10-14 ENCOUNTER — Ambulatory Visit: Payer: 59

## 2021-10-14 DIAGNOSIS — R293 Abnormal posture: Secondary | ICD-10-CM

## 2021-10-14 DIAGNOSIS — M6281 Muscle weakness (generalized): Secondary | ICD-10-CM

## 2021-10-14 DIAGNOSIS — M62838 Other muscle spasm: Secondary | ICD-10-CM

## 2021-10-14 DIAGNOSIS — M25511 Pain in right shoulder: Secondary | ICD-10-CM

## 2021-10-14 NOTE — Therapy (Signed)
OUTPATIENT PHYSICAL THERAPY TREATMENT NOTE   Patient Name: [name] MRN: [id number] DOB:03/11/1981, 41 y.o., male Today's Date: 10/14/2021  PCP: None per patient REFERRING PROVIDER: [facility] Mallow, MD  END OF SESSION:   PT End of Session - 10/14/21 1414     Visit Number 6    Number of Visits 16    Date for PT Re-Evaluation 11/14/21    Authorization Type Medicare/medicaid    PT Start Time 1406    PT Stop Time 1450    PT Time Calculation (min) 44 min    Activity Tolerance Patient tolerated treatment well    Behavior During Therapy WFL for tasks assessed/performed               Past Medical History:  Diagnosis Date   Anxiety    Headache    History of suicidal ideation    Schizophrenia ([place])    Past Surgical History:  Procedure Laterality Date   APPENDECTOMY     MASS EXCISION Right 06/18/2020   Procedure: Excision right arm dermatofibrosarcoma protuberans;  Surgeon: Cindra Presume, MD;  Location: [facility];  Service: Plastics;  Laterality: Right;  needs RNFA   ORIF TOE FRACTURE Right 01/04/2019   Procedure: OPEN REDUCTION INTERNAL FIXATION (ORIF) RIGHT 5TH METATARSAL FRACTURE;  Surgeon: Leandrew Koyanagi, MD;  Location: [facility];  Service: Orthopedics;  Laterality: Right;   TIBIA FRACTURE SURGERY     right   Patient Active Problem List   Diagnosis Date Noted   Painful orthopaedic hardware ([place]) 02/22/2020   Closed fracture of base of fifth metatarsal bone of right foot at metaphyseal-diaphyseal junction 12/30/2018   Schizoaffective disorder, depressive type ([place])    Schizophrenia ([place]) 03/09/2018   Schizophrenia, paranoid type ([place]) 10/11/2017   Overdose 03/05/2015   Seizures ([facility])    Tachycardia    Right shoulder pain     REFERRING DIAG: R30.0 (ICD-10-CM) - Dysuria N34.2 (ICD-10-CM) - Other urethritis  THERAPY DIAG:  Muscle weakness (generalized)  Abnormal posture  Other muscle spasm  Bilateral shoulder pain,  unspecified chronicity  Rationale for Evaluation and Treatment Rehabilitation  PERTINENT HISTORY: Schizophrenia; chronic headaches  PRECAUTIONS: None  SUBJECTIVE: Patient states he is overall a lot better but had an incident where he was pulling his right arm back to avoid his cat scratching him and his right arm began hurting a little more in the past few days.  But overall, he states he is doing well.    PAIN:  Are you having pain? No   OBJECTIVE: (objective measures completed at initial evaluation unless otherwise dated) DIAGNOSTIC FINDINGS:  Negative for any    COGNITION:            Overall cognitive status: Within functional limits for tasks assessed                            MUSCLE LENGTH: Hamstrings: Right wfl deg; Left wfl deg     LUMBAR SPECIAL TESTS:  Straight leg raise test: Negative Active straight leg raise - increased pelvic shifting with SLR improved with stabilizing core FUNCTIONAL TESTS:  Single leg stand - wfl                         Increased lordosis with squatting GAIT:   Comments: wfl               POSTURE: rounded  shoulders, increased lumbar lordosis, increased thoracic kyphosis, and anterior pelvic tilt               PELVIC ALIGNMENT:   LUMBARAROM/PROM   A/PROM A/PROM  eval  Flexion    Extension    Right lateral flexion    Left lateral flexion    Right rotation    Left rotation     (Blank rows = not tested)   LOWER EXTREMITY AROM/PROM:   A/PROM Right eval Left eval  Hip flexion      Hip extension      Hip abduction      Hip adduction      Hip internal rotation      Hip external rotation      Knee flexion      Knee extension      Ankle dorsiflexion      Ankle plantarflexion      Ankle inversion      Ankle eversion       (Blank rows = not tested)   LOWER EXTREMITY MMT: Core strength 4/5; hip 5/5 bil   PALPATION: GENERAL tight paraspinals lumbar and thoracic               External Perineal Exam deferred                Internal Pelvic Floor deferred Patient confirms identification and approves PT to assess internal pelvic floor and treatment No   PELVIC [id number]   MMT eval  Internal Anal Sphincter    External Anal Sphincter    Puborectalis    Diastasis Recti    (Blank rows = not tested)     TONE:     TODAY'S TREATMENT   10/14/21: UBE x 5 min L1 (2.5 min fwd, 2.5 min back) 3 way scapular stabilization with blue loop x 10 each UE 4 D ball rolls x 20 each with red plyo ball Shoulder clock with red plyo x 5 each UE TYI's over blue physio ball x 10 wit 2 lb hand weights  Prone shoulder extension, rows and horiz abduction 2x10 each on each UE with #2 Sidelying ER x 2x10 with 2# 2 x 10 Supine serratus punch x 20 with #2 Bil Ice post exercises to reduce post ex soreness Bil shoulders x 10 min 10/10/21: UBE x 3x3 min L1.5  Prone shoulder  horiz abduction 2x10 each on each UE with #2: Vc to soften effort  Sidelying ER x 2x10 with 1# 10x, then 2# on second set 10x Bil Supine serratus punch x 20 with #2 Bil Supine yellow Tband horizontal abd 10x Standing rows blue band 2x10, green extensions 12x, Initial VC to relax Upper traps Green loop at wrist with full shoulder flexion overhead 10x Purple ball in chair thoracic extensions 10x with neck support from his hands Ardine Eng pose 4 breaths Ice post exercises to reduce post ex soreness Bil shoulders  Treatment:10/06/21 UBE x 4 min L1 (fwd x 2:30 min, back x 2:30 min) Prone shoulder  horiz abduction 2x10 each on each UE with #1 Sidelying ER x 2x10 with 0# then 1# added for second set. Supine serratus punch x 20 with #1 Bil Standing rows blue band 2x10, green extensions 10x,  Ice post exercises to reduce post ex soreness Bil shoulders  Treatment:10/02/21 Exercises  Hip rotation - 1 min Butterfly stretch - 1 min SKTC - had shoulder pain Happy baby - had shoulder pain Thoracic ext in supine with towel - UE overhead  8x Child pose knees knees apart - 2  x 30 sec Rock pose - 2 x 30 sec Transversus abdominus activation in supine - making "s" sound and exhale with gentle contraction - pt needed a lot of cues to keep from bulging abdomen Manual Lumbar and thoracic STM - discussed dry needling for next time  Nuero Re-ed Education and cues for coordination of breathing and pelvic floor muscle relaxing and bulging; diaphragmatic breathing Transversus abdominus activation  Treatment:09/25/21 Exercises  Hip rotation [place] Happy baby Hamstring stretch Thoracic ext in supine with noodle; standing at wall thoracic and lumbar Child pose knees together and knees apart  Manual Not performed today  Nuero Re-ed Education and cues for coordination of breathing and pelvic floor muscle relaxing and bulging; diaphragmatic breathing       PATIENT EDUCATION:  Education details: educated on not drinking more than a gallon of water per day and stop at least 1 hour before bed Person educated: patient Education method: verbal Education comprehension: verbalizes comprehension     HOME EXERCISE PROGRAM: Access Code: [email or web address] URL: [email or web address] Date: 10/02/2021 Prepared by: Jari Favre  Exercises - Supine Hip Internal and External Rotation  - 1 x daily - 7 x weekly - 1 sets - 10 reps - 5 sec hold - Supine Butterfly Groin Stretch  - 1 x daily - 7 x weekly - 3 sets - 10 reps - Quadruped Thoracic Spine Extension  - 1 x daily - 7 x weekly - 3 sets - 10 reps - Supine 90/90 Shoulder Flexion with Abdominal Bracing  - 1 x daily - 7 x weekly - 3 sets - 10 reps  Patient Education - Trigger Point Dry Needling   Added on 10/10/21: Access Code: [email or web address] URL: [email or web address] Date: 10/10/2021 Prepared by: Myrene Galas  Exercises - Seated Thoracic Lumbar Extension with Pectoralis Stretch  - 2 x daily - 7 x weekly - 1 sets - 10 reps - Child's Pose Stretch  - 2 x daily - 7 x weekly - 1 sets - 60 hold -  Sidelying Shoulder ER with Towel and Dumbbell  - 1 x daily - 7 x weekly - 2 sets - 10 reps  Patient Education - Trigger Point Dry Needling   ASSESSMENT:   CLINICAL IMPRESSION: Pt reports his shoulder pain is much better overall.  He had some apprehension with wall clocks, therefore we held on those.  He was able to do TYI's with some fatigue but completed all 10 reps with 2 lbs.  He seems well motivated.  He would benefit from continued skilled PT for shoulder strength and stability.     OBJECTIVE IMPAIRMENTS decreased strength, increased muscle spasms, postural dysfunction, and pain.    ACTIVITY LIMITATIONS sleeping and toileting   PARTICIPATION LIMITATIONS: interpersonal relationship   PERSONAL FACTORS 3+ comorbidities: schizophrenia, headaches, anxiety  are also affecting patient's functional outcome.    REHAB POTENTIAL: Excellent   CLINICAL DECISION MAKING: Stable/uncomplicated   EVALUATION COMPLEXITY: Low     GOALS: Goals reviewed with patient? Yes   SHORT TERM GOALS: Target date: 10/16/2021    Pt will know how to practice healthy bladder habits Baseline: Goal status: Met 09/25/21        LONG TERM GOALS: Target date: 12/11/2021   updated 10/02/21  Pt will be independent with advanced HEP to maintain improvements made throughout therapy   Baseline:  Goal status: Ongoing   2.  Pt will be able to empty his bladder without pain or straining Baseline:  the past couple of weeks no issues  Goal status: Met   3.  Pt will demonstrate core strength by lifting 10 lb correctly for 10 reps Baseline: increased lordosis with squatting Goal status: Ongoing   4.  Pt will have nocturia to 1x at most  Baseline: 1-2/night Goal status: Ongoing         PLAN: PT FREQUENCY: 1x/week   PT DURATION: 8 weeks   PLANNED INTERVENTIONS: Therapeutic exercises, Therapeutic activity, Neuromuscular re-education, Balance training, Gait training, Patient/Family education, Joint  mobilization, Dry Needling, Electrical stimulation, Cryotherapy, Moist heat, Taping, Biofeedback, and Manual therapy   PLAN FOR NEXT SESSION: Pelvic session next    [place] B. Kham Zuckerman, PT 10/14/21 10:26 PM  [facility] [address] [place], [place] [address] Phone # [phone] Fax [phone]

## 2021-10-15 ENCOUNTER — Telehealth: Payer: Self-pay | Admitting: Physician Assistant

## 2021-10-15 ENCOUNTER — Ambulatory Visit (INDEPENDENT_AMBULATORY_CARE_PROVIDER_SITE_OTHER): Payer: 59

## 2021-10-15 ENCOUNTER — Other Ambulatory Visit: Payer: Self-pay | Admitting: Physician Assistant

## 2021-10-15 ENCOUNTER — Encounter: Payer: Self-pay | Admitting: Orthopaedic Surgery

## 2021-10-15 ENCOUNTER — Ambulatory Visit (INDEPENDENT_AMBULATORY_CARE_PROVIDER_SITE_OTHER): Payer: 59 | Admitting: Orthopaedic Surgery

## 2021-10-15 DIAGNOSIS — M25571 Pain in right ankle and joints of right foot: Secondary | ICD-10-CM

## 2021-10-15 DIAGNOSIS — G8929 Other chronic pain: Secondary | ICD-10-CM

## 2021-10-15 MED ORDER — TRAMADOL HCL 50 MG PO TABS: 50.0000 mg | ORAL_TABLET | Freq: Three times a day (TID) | ORAL | 0 refills | Status: AC | PRN

## 2021-10-15 MED ORDER — DICLOFENAC SODIUM 75 MG PO TBEC: 75.0000 mg | DELAYED_RELEASE_TABLET | Freq: Two times a day (BID) | ORAL | 2 refills | Status: AC | PRN

## 2021-10-15 NOTE — Progress Notes (Unsigned)
Office Visit Note   Patient: [name]           Date of Birth: 11-13-1980           MRN: [id number] Visit Date: 10/15/2021              Requested by: No referring provider defined for this encounter. PCP: Patient, No Pcp Per   Assessment & Plan: Visit Diagnoses:  1. Chronic pain of right ankle     Plan: Impression is chronic right shoulder pain which is starting to improve with physical therapy and right ankle pain.  In regards to the right shoulder, this is improving with physical therapy so we will continue with this for now.  If his symptoms do not continue to improve he will follow-up for recheck.  In regards to the ankle, it is hard to tell what is going on but there is nothing untoward on x-ray.  His symptoms are very intermittent and only occur intermittently with walking so I do not feel the need to put him in a boot.  We will change up his anti-inflammatory in hopes of helping.  Follow-up as needed.  Follow-Up Instructions: Return if symptoms worsen or fail to improve.   Orders:  Orders Placed This Encounter  Procedures   XR Ankle Complete Right   No orders of the defined types were placed in this encounter.     Procedures: No procedures performed   Clinical Data: No additional findings.   Subjective: Chief Complaint  Patient presents with   Right Shoulder - Follow-up   Left Shoulder - Follow-up   Right Ankle - Pain    HPI patient is a pleasant 41 year old gentleman who comes in today with mild right shoulder pain and right ankle pain.  In regards to the right shoulder, he has had mild but chronic pain for years.  The pain began after picking up heavy boxes a long time ago.  He had a feeling of looseness and was sent to physical therapy few years ago.  Never went to therapy at that time.  He has recently started physical therapy over the past month which has been helping.  He has taken ibuprofen which no longer helps.  He is not interested in cortisone  injections.  Other issue brings up today is right ankle pain.  He has been dealing with this for the past 1 to 2 weeks.  He has minimal pain to the medial lateral ankle which only occurs intermittently with walking.  He is status post right tibia IM nail.  Review of Systems as detailed in HPI.  All others reviewed and are negative.   Objective: Vital Signs: There were no vitals taken for this visit.  Physical Exam well-developed well-nourished gentleman in no acute distress.  Alert and oriented x3.  Ortho Exam right ankle exam shows no swelling and no ecchymosis.  No tenderness.  Full range of motion without pain.  He is neurovascular tact distally.  Right shoulder exam shows full active range of motion all planes without pain.  Negative empty can, negative cross body test.  Full strength.  He is neurovascular tact distally.  Specialty Comments:  No specialty comments available.  Imaging: No acute or structural abnormalities   PMFS History: Patient Active Problem List   Diagnosis Date Noted   Painful orthopaedic hardware ([place]) 02/22/2020   Closed fracture of base of fifth metatarsal bone of right foot at metaphyseal-diaphyseal junction 12/30/2018   Schizoaffective disorder, depressive type (  [place])    Schizophrenia ([place]) 03/09/2018   Schizophrenia, paranoid type ([place]) 10/11/2017   Overdose 03/05/2015   Seizures ([place])    Tachycardia    Right shoulder pain    Past Medical History:  Diagnosis Date   Anxiety    Headache    History of suicidal ideation    Schizophrenia ([place])     No family history on file.  Past Surgical History:  Procedure Laterality Date   APPENDECTOMY     MASS EXCISION Right 06/18/2020   Procedure: Excision right arm dermatofibrosarcoma protuberans;  Surgeon: Cindra Presume, MD;  Location: [facility];  Service: Plastics;  Laterality: Right;  needs RNFA   ORIF TOE FRACTURE Right 01/04/2019   Procedure: OPEN REDUCTION INTERNAL FIXATION (ORIF) RIGHT  5TH METATARSAL FRACTURE;  Surgeon: Leandrew Koyanagi, MD;  Location: [facility];  Service: Orthopedics;  Laterality: Right;   TIBIA FRACTURE SURGERY     right   Social History   Occupational History   Not on file  Tobacco Use   Smoking status: Every Day    Types: Cigarettes   Smokeless tobacco: Never  Vaping Use   Vaping Use: Never used  Substance and Sexual Activity   Alcohol use: Yes    Comment: weekly   Drug use: Yes    Types: Marijuana    Comment: hx of crack use also   Sexual activity: Yes

## 2021-10-15 NOTE — Telephone Encounter (Signed)
Pt called stated he was seen this morning and 2 prescriptions was to be sent to [facility]. Please call pt when meds have been sent to pharmacy. Pt phone number is [phone].

## 2021-10-15 NOTE — Telephone Encounter (Signed)
sent 

## 2021-10-15 NOTE — Telephone Encounter (Signed)
Called and notified patient.

## 2021-10-16 ENCOUNTER — Ambulatory Visit: Payer: 59

## 2021-10-21 ENCOUNTER — Ambulatory Visit: Payer: 59

## 2021-10-23 NOTE — Therapy (Deleted)
OUTPATIENT PHYSICAL THERAPY TREATMENT NOTE   Patient Name: [name] MRN: [id number] DOB:July 29, 1980, 41 y.o., male Today's Date: 10/23/2021  PCP: None per patient REFERRING PROVIDER: [facility] Mallow, MD  END OF SESSION:       Past Medical History:  Diagnosis Date   Anxiety    Headache    History of suicidal ideation    Schizophrenia [facility])    Past Surgical History:  Procedure Laterality Date   APPENDECTOMY     MASS EXCISION Right 06/18/2020   Procedure: Excision right arm dermatofibrosarcoma protuberans;  Surgeon: Cindra Presume, MD;  Location: [facility];  Service: Plastics;  Laterality: Right;  needs RNFA   ORIF TOE FRACTURE Right 01/04/2019   Procedure: OPEN REDUCTION INTERNAL FIXATION (ORIF) RIGHT 5TH METATARSAL FRACTURE;  Surgeon: Leandrew Koyanagi, MD;  Location: [facility];  Service: Orthopedics;  Laterality: Right;   TIBIA FRACTURE SURGERY     right   Patient Active Problem List   Diagnosis Date Noted   Painful orthopaedic hardware ([place]) 02/22/2020   Closed fracture of base of fifth metatarsal bone of right foot at metaphyseal-diaphyseal junction 12/30/2018   Schizoaffective disorder, depressive type ([place])    Schizophrenia ([place]) 03/09/2018   Schizophrenia, paranoid type ([place]) 10/11/2017   Overdose 03/05/2015   Seizures ([place])    Tachycardia    Right shoulder pain     REFERRING DIAG: R30.0 (ICD-10-CM) - Dysuria N34.2 (ICD-10-CM) - Other urethritis  THERAPY DIAG:  No diagnosis found.  Rationale for Evaluation and Treatment Rehabilitation  PERTINENT HISTORY: Schizophrenia; chronic headaches  PRECAUTIONS: None  SUBJECTIVE: Patient states he is overall a lot better but had an incident where he was pulling his right arm back to avoid his cat scratching him and his right arm began hurting a little more in the past few days.  But overall, he states he is doing well.    PAIN:  Are you having pain? No   OBJECTIVE: (objective  measures completed at initial evaluation unless otherwise dated) DIAGNOSTIC FINDINGS:  Negative for any    COGNITION:            Overall cognitive status: Within functional limits for tasks assessed                            MUSCLE LENGTH: Hamstrings: Right wfl deg; Left wfl deg     LUMBAR SPECIAL TESTS:  Straight leg raise test: Negative Active straight leg raise - increased pelvic shifting with SLR improved with stabilizing core FUNCTIONAL TESTS:  Single leg stand - wfl                         Increased lordosis with squatting GAIT:   Comments: wfl               POSTURE: rounded shoulders, increased lumbar lordosis, increased thoracic kyphosis, and anterior pelvic tilt               PELVIC ALIGNMENT:   LUMBARAROM/PROM   A/PROM A/PROM  eval  Flexion    Extension    Right lateral flexion    Left lateral flexion    Right rotation    Left rotation     (Blank rows = not tested)   LOWER EXTREMITY AROM/PROM:   A/PROM Right eval Left eval  Hip flexion      Hip extension  Hip abduction      Hip adduction      Hip internal rotation      Hip external rotation      Knee flexion      Knee extension      Ankle dorsiflexion      Ankle plantarflexion      Ankle inversion      Ankle eversion       (Blank rows = not tested)   LOWER EXTREMITY MMT: Core strength 4/5; hip 5/5 bil   PALPATION: GENERAL tight paraspinals lumbar and thoracic               External Perineal Exam deferred               Internal Pelvic Floor deferred Patient confirms identification and approves PT to assess internal pelvic floor and treatment No   PELVIC [id number]   MMT eval  Internal Anal Sphincter    External Anal Sphincter    Puborectalis    Diastasis Recti    (Blank rows = not tested)     TONE:     TODAY'S TREATMENT   10/14/21: UBE x 5 min L1 (2.5 min fwd, 2.5 min back) 3 way scapular stabilization with blue loop x 10 each UE 4 D ball rolls x 20 each with red  plyo ball Shoulder clock with red plyo x 5 each UE TYI's over blue physio ball x 10 wit 2 lb hand weights  Prone shoulder extension, rows and horiz abduction 2x10 each on each UE with #2 Sidelying ER x 2x10 with 2# 2 x 10 Supine serratus punch x 20 with #2 Bil Ice post exercises to reduce post ex soreness Bil shoulders x 10 min 10/10/21: UBE x 3x3 min L1.5  Prone shoulder  horiz abduction 2x10 each on each UE with #2: Vc to soften effort  Sidelying ER x 2x10 with 1# 10x, then 2# on second set 10x Bil Supine serratus punch x 20 with #2 Bil Supine yellow Tband horizontal abd 10x Standing rows blue band 2x10, green extensions 12x, Initial VC to relax Upper traps Green loop at wrist with full shoulder flexion overhead 10x Purple ball in chair thoracic extensions 10x with neck support from his hands Ardine Eng pose 4 breaths Ice post exercises to reduce post ex soreness Bil shoulders  Treatment:10/06/21 UBE x 4 min L1 (fwd x 2:30 min, back x 2:30 min) Prone shoulder  horiz abduction 2x10 each on each UE with #1 Sidelying ER x 2x10 with 0# then 1# added for second set. Supine serratus punch x 20 with #1 Bil Standing rows blue band 2x10, green extensions 10x,  Ice post exercises to reduce post ex soreness Bil shoulders  Treatment:10/02/21 Exercises  Hip rotation - 1 min Butterfly stretch - 1 min SKTC - had shoulder pain Happy baby - had shoulder pain Thoracic ext in supine with towel - UE overhead 8x Child pose knees knees apart - 2 x 30 sec Rock pose - 2 x 30 sec Transversus abdominus activation in supine - making "s" sound and exhale with gentle contraction - pt needed a lot of cues to keep from bulging abdomen Manual Lumbar and thoracic STM - discussed dry needling for next time  Nuero Re-ed Education and cues for coordination of breathing and pelvic floor muscle relaxing and bulging; diaphragmatic breathing Transversus abdominus activation  Treatment:09/25/21 Exercises  Hip  rotation SKTC Happy baby Hamstring stretch Thoracic ext in supine with noodle; standing at wall thoracic  and lumbar Child pose knees together and knees apart  Manual Not performed today  Nuero Re-ed Education and cues for coordination of breathing and pelvic floor muscle relaxing and bulging; diaphragmatic breathing       PATIENT EDUCATION:  Education details: educated on not drinking more than a gallon of water per day and stop at least 1 hour before bed Person educated: patient Education method: verbal Education comprehension: verbalizes comprehension     HOME EXERCISE PROGRAM: Access Code: [email or web address] URL: [email or web address] Date: 10/02/2021 Prepared by: Jari Favre  Exercises - Supine Hip Internal and External Rotation  - 1 x daily - 7 x weekly - 1 sets - 10 reps - 5 sec hold - Supine Butterfly Groin Stretch  - 1 x daily - 7 x weekly - 3 sets - 10 reps - Quadruped Thoracic Spine Extension  - 1 x daily - 7 x weekly - 3 sets - 10 reps - Supine 90/90 Shoulder Flexion with Abdominal Bracing  - 1 x daily - 7 x weekly - 3 sets - 10 reps  Patient Education - Trigger Point Dry Needling   Added on 10/10/21: Access Code: [email or web address] URL: [email or web address] Date: 10/10/2021 Prepared by: Myrene Galas  Exercises - Seated Thoracic Lumbar Extension with Pectoralis Stretch  - 2 x daily - 7 x weekly - 1 sets - 10 reps - Child's Pose Stretch  - 2 x daily - 7 x weekly - 1 sets - 60 hold - Sidelying Shoulder ER with Towel and Dumbbell  - 1 x daily - 7 x weekly - 2 sets - 10 reps  Patient Education - Trigger Point Dry Needling   ASSESSMENT:   CLINICAL IMPRESSION: Pt reports his shoulder pain is much better overall.  He had some apprehension with wall clocks, therefore we held on those.  He was able to do TYI's with some fatigue but completed all 10 reps with 2 lbs.  He seems well motivated.  He would benefit from continued skilled PT for  shoulder strength and stability.     OBJECTIVE IMPAIRMENTS decreased strength, increased muscle spasms, postural dysfunction, and pain.    ACTIVITY LIMITATIONS sleeping and toileting   PARTICIPATION LIMITATIONS: interpersonal relationship   PERSONAL FACTORS 3+ comorbidities: schizophrenia, headaches, anxiety  are also affecting patient's functional outcome.    REHAB POTENTIAL: Excellent   CLINICAL DECISION MAKING: Stable/uncomplicated   EVALUATION COMPLEXITY: Low     GOALS: Goals reviewed with patient? Yes   SHORT TERM GOALS: Target date: 10/16/2021    Pt will know how to practice healthy bladder habits Baseline: Goal status: Met 09/25/21        LONG TERM GOALS: Target date: 12/11/2021   updated 10/02/21  Pt will be independent with advanced HEP to maintain improvements made throughout therapy   Baseline:  Goal status: Ongoing   2.  Pt will be able to empty his bladder without pain or straining Baseline: the past couple of weeks no issues  Goal status: Met   3.  Pt will demonstrate core strength by lifting 10 lb correctly for 10 reps Baseline: increased lordosis with squatting Goal status: Ongoing   4.  Pt will have nocturia to 1x at most  Baseline: 1-2/night Goal status: Ongoing         PLAN: PT FREQUENCY: 1x/week   PT DURATION: 8 weeks   PLANNED INTERVENTIONS: Therapeutic exercises, Therapeutic activity, Neuromuscular re-education, Balance training, Gait training, Patient/Family education, Joint mobilization, Dry Needling, Electrical stimulation, Cryotherapy, Moist heat, Taping, Biofeedback, and  Manual therapy   PLAN FOR NEXT SESSION: Pelvic session next    [place] B. Fields, PT 10/23/21 8:46 AM  [facility] [address] [place],  [address] Phone # [phone] Fax [phone]

## 2021-10-24 ENCOUNTER — Ambulatory Visit: Payer: 59 | Admitting: Physical Therapy

## 2021-10-24 ENCOUNTER — Encounter: Payer: Self-pay | Admitting: Physical Therapy

## 2021-10-24 DIAGNOSIS — M6281 Muscle weakness (generalized): Secondary | ICD-10-CM

## 2021-10-24 DIAGNOSIS — M25511 Pain in right shoulder: Secondary | ICD-10-CM

## 2021-10-24 DIAGNOSIS — R293 Abnormal posture: Secondary | ICD-10-CM

## 2021-10-24 DIAGNOSIS — M62838 Other muscle spasm: Secondary | ICD-10-CM

## 2021-10-24 NOTE — Therapy (Addendum)
OUTPATIENT PHYSICAL THERAPY TREATMENT NOTE   Patient Name: [name] MRN: [id number] DOB:[birth year], 41 y.o., male Today's Date: 10/24/2021  PCP: None per patient REFERRING PROVIDER: Lucas Mallow, MD  END OF SESSION:       Past Medical History:  Diagnosis Date   Anxiety    Headache    History of suicidal ideation    Schizophrenia [facility])    Past Surgical History:  Procedure Laterality Date   APPENDECTOMY     MASS EXCISION Right 06/18/2020   Procedure: Excision right arm dermatofibrosarcoma protuberans;  Surgeon: Cindra Presume, MD;  Location: [place];  Service: Plastics;  Laterality: Right;  needs RNFA   ORIF TOE FRACTURE Right 01/04/2019   Procedure: OPEN REDUCTION INTERNAL FIXATION (ORIF) RIGHT 5TH METATARSAL FRACTURE;  Surgeon: Leandrew Koyanagi, MD;  Location: [place];  Service: Orthopedics;  Laterality: Right;   TIBIA FRACTURE SURGERY     right   Patient Active Problem List   Diagnosis Date Noted   Painful orthopaedic hardware ([place]) 02/22/2020   Closed fracture of base of fifth metatarsal bone of right foot at metaphyseal-diaphyseal junction 12/30/2018   Schizoaffective disorder, depressive type ([place])    Schizophrenia ([place]) 03/09/2018   Schizophrenia, paranoid type ([place]) 10/11/2017   Overdose 03/05/2015   Seizures ([facility])    Tachycardia    Right shoulder pain     REFERRING DIAG: R30.0 (ICD-10-CM) - Dysuria N34.2 (ICD-10-CM) - Other urethritis  THERAPY DIAG:  Muscle weakness (generalized)  Other muscle spasm  Bilateral shoulder pain, unspecified chronicity  Abnormal posture  Rationale for Evaluation and Treatment Rehabilitation  PERTINENT HISTORY: Schizophrenia; chronic headaches  PRECAUTIONS: None  SUBJECTIVE: Pt reports back to PT with reports of increased pain this week. He states that he has not been active with his HEP and noted an increase in shoulder pain again. L > R.   PAIN:  Are you having pain?  5/10   OBJECTIVE: (objective measures completed at initial evaluation unless otherwise dated) DIAGNOSTIC FINDINGS:  Negative for any    COGNITION:            Overall cognitive status: Within functional limits for tasks assessed                            MUSCLE LENGTH: Hamstrings: Right wfl deg; Left wfl deg     LUMBAR SPECIAL TESTS:  Straight leg raise test: Negative Active straight leg raise - increased pelvic shifting with SLR improved with stabilizing core FUNCTIONAL TESTS:  Single leg stand - wfl                         Increased lordosis with squatting GAIT:   Comments: wfl               POSTURE: rounded shoulders, increased lumbar lordosis, increased thoracic kyphosis, and anterior pelvic tilt               PELVIC ALIGNMENT:   LUMBARAROM/PROM   A/PROM A/PROM  eval  Flexion    Extension    Right lateral flexion    Left lateral flexion    Right rotation    Left rotation     (Blank rows = not tested)   LOWER EXTREMITY AROM/PROM:   A/PROM Right eval Left eval  Hip flexion      Hip extension      Hip abduction  Hip adduction      Hip internal rotation      Hip external rotation      Knee flexion      Knee extension      Ankle dorsiflexion      Ankle plantarflexion      Ankle inversion      Ankle eversion       (Blank rows = not tested)   LOWER EXTREMITY MMT: Core strength 4/5; hip 5/5 bil   PALPATION: GENERAL tight paraspinals lumbar and thoracic               External Perineal Exam deferred               Internal Pelvic Floor deferred Patient confirms identification and approves PT to assess internal pelvic floor and treatment No   PELVIC ZOX:[id number]   MMT eval  Internal Anal Sphincter    External Anal Sphincter    Puborectalis    Diastasis Recti    (Blank rows = not tested)     TONE:     TODAY'S TREATMENT  10/24/21: UBE x 3x3 min L1.5  Prone shoulder horiz abduction 2x10 each on each UE with #1:  Sidelying ER x 2x10 with  1# 10x, then 2# on second set 10x Bil Supine serratus punch x 20 with #2 Bil Supine yellow Tband horizontal abd 10x Standing rows green band 2x10, green extensions 12x, Initial VC to relax Upper traps Green loop at wrist with full shoulder flexion overhead 2x10 Wall clocks with green loop at wrist 10 x each side. Open books against wall 10x each side.   Ice post exercises to reduce post ex soreness Bil shoulders  10/10/21: UBE x 3x3 min L1.5  Prone shoulder  horiz abduction 2x10 each on each UE with #2: Vc to soften effort  Sidelying ER x 2x10 with 1# 10x, then 2# on second set 10x Bil Supine serratus punch x 20 with #2 Bil Supine yellow Tband horizontal abd 10x Standing rows blue band 2x10, green extensions 12x, Initial VC to relax Upper traps Green loop at wrist with full shoulder flexion overhead x10 Purple ball in chair thoracic extensions 10x with neck support from his hands Ice post exercises to reduce post ex soreness Bil shoulders  Treatment:10/06/21 UBE x 4 min L1 (fwd x 2:30 min, back x 2:30 min) Prone shoulder  horiz abduction 2x10 each on each UE with #1 Sidelying ER x 2x10 with 0# then 1# added for second set. Supine serratus punch x 20 with #1 Bil Standing rows blue band 2x10, green extensions 10x,  Ice post exercises to reduce post ex soreness Bil shoulders     PATIENT EDUCATION:  Education details: educated on not drinking more than a gallon of water per day and stop at least 1 hour before bed Person educated: patient Education method: verbal Education comprehension: verbalizes comprehension     HOME EXERCISE PROGRAM: Access Code: [email or web address] URL: [email or web address] Date: 10/02/2021 Prepared by: Jari Favre  Exercises - Supine Hip Internal and External Rotation  - 1 x daily - 7 x weekly - 1 sets - 10 reps - 5 sec hold - Supine Butterfly Groin Stretch  - 1 x daily - 7 x weekly - 3 sets - 10 reps - Quadruped Thoracic Spine Extension  - 1 x  daily - 7 x weekly - 3 sets - 10 reps - Supine 90/90 Shoulder Flexion with Abdominal Bracing  - 1 x daily - 7 x weekly -  3 sets - 10 reps  Patient Education - Trigger Point Dry Needling   Added on 10/10/21: Access Code: [email or web address] URL: [email or web address] Date: 10/10/2021 Prepared by: Myrene Galas  Exercises - Seated Thoracic Lumbar Extension with Pectoralis Stretch  - 2 x daily - 7 x weekly - 1 sets - 10 reps - Child's Pose Stretch  - 2 x daily - 7 x weekly - 1 sets - 60 hold - Sidelying Shoulder ER with Towel and Dumbbell  - 1 x daily - 7 x weekly - 2 sets - 10 reps  Patient Education - Trigger Point Dry Needling   ASSESSMENT:   CLINICAL IMPRESSION: Pt arrives to PT with increased pain today due to non-compliance with his HEP. He requests to perform "light weights today". Despite reports of pain, pt tolerated session well with no increase in pain and good forum. Discussed importance of continued strengthening to help minimize pain. Min verbal cues for technique required. Pt reports he feels his posture and postural awareness is greatly improving. Will continue to progress pt as tolerated. He reports a decrease in pain to a 2/10 upon completion of session today.   OBJECTIVE IMPAIRMENTS decreased strength, increased muscle spasms, postural dysfunction, and pain.    ACTIVITY LIMITATIONS sleeping and toileting   PARTICIPATION LIMITATIONS: interpersonal relationship   PERSONAL FACTORS 3+ comorbidities: schizophrenia, headaches, anxiety  are also affecting patient's functional outcome.    REHAB POTENTIAL: Excellent   CLINICAL DECISION MAKING: Stable/uncomplicated   EVALUATION COMPLEXITY: Low     GOALS: Goals reviewed with patient? No     SHORT TERM GOALS: Target date: 10/17/2021     Pt will be I and compliant with initial HEP. Baseline:  Goal status: INITIAL   Baseline: Patient reports his pain does not get higher than 3/10 with activity.  Baseline:   Goal status: INITIAL   LONG TERM GOALS: Target date: 11/14/2021  (Remove Blue Hyperlink)   Pt will be independent with advanced HEP to continue to address postural limitations and muscle imbalances. Baseline: not provided Goal status: INITIAL Pt will report <2 pain in bilat shoulder with activity.  Baseline:  Goal status: INITIAL Pt will demonstrate 5/5 strength for all muscle groups.  Baseline:  Goal status: INITIAL 2.  Pt will will demonstrate negative apprehension with R UE test.  Baseline:  Goal status: INITIAL         PLAN: PT FREQUENCY: 1x/week   PT DURATION: 8 weeks   PLANNED INTERVENTIONS: Therapeutic exercises, Therapeutic activity, Neuromuscular re-education, Balance training, Gait training, Patient/Family education, Joint mobilization, Dry Needling, Electrical stimulation, Cryotherapy, Moist heat, Taping, Biofeedback, and Manual therapy   PLAN FOR NEXT SESSION: Pelvic session next    Lynden Ang, PT 10/24/2021, 10:53 AM  PHYSICAL THERAPY DISCHARGE SUMMARY  Visits from Start of Care: 7  Current functional level related to goals / functional outcomes: Pt has made significant progress in PT, reaching pain free functional movement.    Remaining deficits: Intermittent pain when not compliant with his HEP.    Education / Equipment: Victorino Sparrow.    Patient agrees to discharge. Patient goals were partially met. Patient is being discharged due to not returning since the last visit.

## 2021-10-27 ENCOUNTER — Ambulatory Visit: Payer: 59 | Admitting: Physical Therapy

## 2021-10-27 NOTE — Therapy (Deleted)
OUTPATIENT PHYSICAL THERAPY TREATMENT NOTE   Patient Name: [name] MRN: [id number] DOB:08-27-1980, 41 y.o., male Today's Date: 10/27/2021  PCP: None per patient REFERRING PROVIDER: Lucas Mallow, MD  END OF SESSION:     Past Medical History:  Diagnosis Date   Anxiety    Headache    History of suicidal ideation    Schizophrenia [facility])    Past Surgical History:  Procedure Laterality Date   APPENDECTOMY     MASS EXCISION Right 06/18/2020   Procedure: Excision right arm dermatofibrosarcoma protuberans;  Surgeon: Cindra Presume, MD;  Location: [place];  Service: Plastics;  Laterality: Right;  needs RNFA   ORIF TOE FRACTURE Right 01/04/2019   Procedure: OPEN REDUCTION INTERNAL FIXATION (ORIF) RIGHT 5TH METATARSAL FRACTURE;  Surgeon: Leandrew Koyanagi, MD;  Location: [place];  Service: Orthopedics;  Laterality: Right;   TIBIA FRACTURE SURGERY     right   Patient Active Problem List   Diagnosis Date Noted   Painful orthopaedic hardware ([place]) 02/22/2020   Closed fracture of base of fifth metatarsal bone of right foot at metaphyseal-diaphyseal junction 12/30/2018   Schizoaffective disorder, depressive type ([place])    Schizophrenia ([place]) 03/09/2018   Schizophrenia, paranoid type ([place]) 10/11/2017   Overdose 03/05/2015   Seizures ([place])    Tachycardia    Right shoulder pain     REFERRING DIAG: R30.0 (ICD-10-CM) - Dysuria N34.2 (ICD-10-CM) - Other urethritis  THERAPY DIAG:  No diagnosis found.  Rationale for Evaluation and Treatment Rehabilitation  PERTINENT HISTORY: Schizophrenia; chronic headaches  PRECAUTIONS: None  SUBJECTIVE: I had a little bit of pain on the tip of the penis ealier this week at nighttime   PAIN:  Are you having pain? No   OBJECTIVE: (objective measures completed at initial evaluation unless otherwise dated) DIAGNOSTIC FINDINGS:  Negative for any    COGNITION:            Overall cognitive status: Within  functional limits for tasks assessed                            MUSCLE LENGTH: Hamstrings: Right wfl deg; Left wfl deg     LUMBAR SPECIAL TESTS:  Straight leg raise test: Negative Active straight leg raise - increased pelvic shifting with SLR improved with stabilizing core FUNCTIONAL TESTS:  Single leg stand - wfl                         Increased lordosis with squatting GAIT:   Comments: wfl               POSTURE: rounded shoulders, increased lumbar lordosis, increased thoracic kyphosis, and anterior pelvic tilt               PELVIC ALIGNMENT:   LUMBARAROM/PROM   A/PROM A/PROM  eval  Flexion    Extension    Right lateral flexion    Left lateral flexion    Right rotation    Left rotation     (Blank rows = not tested)   LOWER EXTREMITY AROM/PROM:   A/PROM Right eval Left eval  Hip flexion      Hip extension      Hip abduction      Hip adduction      Hip internal rotation      Hip external rotation      Knee flexion  Knee extension      Ankle dorsiflexion      Ankle plantarflexion      Ankle inversion      Ankle eversion       (Blank rows = not tested)   LOWER EXTREMITY MMT: Core strength 4/5; hip 5/5 bil   PALPATION: GENERAL tight paraspinals lumbar and thoracic               External Perineal Exam deferred               Internal Pelvic Floor deferred Patient confirms identification and approves PT to assess internal pelvic floor and treatment No   PELVIC MPN:[id number]   MMT eval  Internal Anal Sphincter    External Anal Sphincter    Puborectalis    Diastasis Recti    (Blank rows = not tested)     TONE:     TODAY'S TREATMENT  Treatment:10/02/21 Exercises  Hip rotation - 1 min Butterfly stretch - 1 min SKTC - had shoulder pain Happy baby - had shoulder pain Thoracic ext in supine with towel - UE overhead 8x Child pose knees knees apart - 2 x 30 sec Rock pose - 2 x 30 sec Transversus abdominus activation in supine - making "s" sound  and exhale with gentle contraction - pt needed a lot of cues to keep from bulging abdomen Manual Lumbar and thoracic STM - discussed dry needling for next time  Nuero Re-ed Education and cues for coordination of breathing and pelvic floor muscle relaxing and bulging; diaphragmatic breathing Transversus abdominus activation  Treatment:09/25/21 Exercises  Hip rotation SKTC Happy baby Hamstring stretch Thoracic ext in supine with noodle; standing at wall thoracic and lumbar Child pose knees together and knees apart  Manual Not performed today  Nuero Re-ed Education and cues for coordination of breathing and pelvic floor muscle relaxing and bulging; diaphragmatic breathing       PATIENT EDUCATION:  Education details: educated on not drinking more than a gallon of water per day and stop at least 1 hour before bed Person educated: patient Education method: verbal Education comprehension: verbalizes comprehension     HOME EXERCISE PROGRAM: Access Code: [email or web address] URL: [email or web address] Date: 10/02/2021 Prepared by: Jari Favre  Exercises - Supine Hip Internal and External Rotation  - 1 x daily - 7 x weekly - 1 sets - 10 reps - 5 sec hold - Supine Butterfly Groin Stretch  - 1 x daily - 7 x weekly - 3 sets - 10 reps - Quadruped Thoracic Spine Extension  - 1 x daily - 7 x weekly - 3 sets - 10 reps - Supine 90/90 Shoulder Flexion with Abdominal Bracing  - 1 x daily - 7 x weekly - 3 sets - 10 reps  Patient Education - Trigger Point Dry Needling    ASSESSMENT:   CLINICAL IMPRESSION: Today's session focus was to review HEP.  Pt was given stretches and breathing techniques for diaphragm breathing.  Pt did well with addition of STM and transversus abdominus activation. He has tension in deep lumbar multifidi and would benefit from skilled PT to address using dry needling but was just educated on this today.  STM did release some and increased muscle length.  Pt given updates to HEP as seen. Needing a fair amount of cues to activate the transversus abdominus.  Pt will benefit from skilled PT to address impairments and improve function without pain.     OBJECTIVE IMPAIRMENTS decreased strength, increased muscle spasms, postural  dysfunction, and pain.    ACTIVITY LIMITATIONS sleeping and toileting   PARTICIPATION LIMITATIONS: interpersonal relationship   PERSONAL FACTORS 3+ comorbidities: schizophrenia, headaches, anxiety  are also affecting patient's functional outcome.    REHAB POTENTIAL: Excellent   CLINICAL DECISION MAKING: Stable/uncomplicated   EVALUATION COMPLEXITY: Low     GOALS: Goals reviewed with patient? Yes   SHORT TERM GOALS: Target date: 10/16/2021    Pt will know how to practice healthy bladder habits Baseline: Goal status: Met 09/25/21        LONG TERM GOALS: Target date: 12/11/2021   updated 10/02/21  Pt will be independent with advanced HEP to maintain improvements made throughout therapy   Baseline:  Goal status: Ongoing   2.  Pt will be able to empty his bladder without pain or straining Baseline: the past couple of weeks no issues  Goal status: Met   3.  Pt will demonstrate core strength by lifting 10 lb correctly for 10 reps Baseline: increased lordosis with squatting Goal status: Ongoing   4.  Pt will have nocturia to 1x at most  Baseline: 1-2/night Goal status: Ongoing         PLAN: PT FREQUENCY: 1x/week   PT DURATION: 8 weeks   PLANNED INTERVENTIONS: Therapeutic exercises, Therapeutic activity, Neuromuscular re-education, Balance training, Gait training, Patient/Family education, Joint mobilization, Dry Needling, Electrical stimulation, Cryotherapy, Moist heat, Taping, Biofeedback, and Manual therapy   PLAN FOR NEXT SESSION: STM and maybe dry needling to lumbar and thoracic ; stretch and deep breathing; f/u on HEP, transversus abdominus activation     Camillo Flaming Demi Trieu, PT 10/27/2021,  11:39 AM

## 2021-10-29 ENCOUNTER — Ambulatory Visit: Payer: 59

## 2021-11-11 ENCOUNTER — Ambulatory Visit: Payer: 59

## 2021-11-24 ENCOUNTER — Ambulatory Visit: Payer: 59

## 2023-07-23 ENCOUNTER — Emergency Department (HOSPITAL_COMMUNITY)
Admission: EM | Admit: 2023-07-23 | Discharge: 2023-07-24 | Disposition: A | Discharge status: Home / Self Care | Attending: Emergency Medicine | Admitting: Emergency Medicine

## 2023-07-23 ENCOUNTER — Other Ambulatory Visit: Payer: Self-pay

## 2023-07-23 ENCOUNTER — Encounter (HOSPITAL_COMMUNITY): Payer: Self-pay

## 2023-07-23 DIAGNOSIS — Z79899 Other long term (current) drug therapy: Secondary | ICD-10-CM | POA: Insufficient documentation

## 2023-07-23 DIAGNOSIS — F309 Manic episode, unspecified: Secondary | ICD-10-CM | POA: Diagnosis present

## 2023-07-23 DIAGNOSIS — Z789 Other specified health status: Secondary | ICD-10-CM

## 2023-07-23 DIAGNOSIS — T510X1A Toxic effect of ethanol, accidental (unintentional), initial encounter: Secondary | ICD-10-CM | POA: Diagnosis not present

## 2023-07-23 DIAGNOSIS — R451 Restlessness and agitation: Secondary | ICD-10-CM

## 2023-07-23 DIAGNOSIS — Z5900 Homelessness unspecified: Secondary | ICD-10-CM | POA: Diagnosis not present

## 2023-07-23 NOTE — ED Triage Notes (Signed)
 Pt alert and oriented but hyperactive and manic. Not aggressive. Denies SI/HI. Pt reports being homeless. Denies drug/alcohol.

## 2023-07-23 NOTE — ED Triage Notes (Signed)
 Pt found wandering in traffic and spitting on cars. Several people called about manic behavior and patient running in traffic. Pt brought into ER as a result.

## 2023-07-24 DIAGNOSIS — T510X1A Toxic effect of ethanol, accidental (unintentional), initial encounter: Secondary | ICD-10-CM | POA: Diagnosis not present

## 2023-07-24 LAB — CBC
HCT: 48.5 % (ref 39.0–52.0)
Hemoglobin: 16.3 g/dL (ref 13.0–17.0)
MCH: 31.4 pg (ref 26.0–34.0)
MCHC: 33.6 g/dL (ref 30.0–36.0)
MCV: 93.4 fL (ref 80.0–100.0)
Platelets: 352 10*3/uL (ref 150–400)
RBC: 5.19 MIL/uL (ref 4.22–5.81)
RDW: 13.8 % (ref 11.5–15.5)
WBC: 13.6 10*3/uL — ABNORMAL HIGH (ref 4.0–10.5)
nRBC: 0 % (ref 0.0–0.2)

## 2023-07-24 LAB — COMPREHENSIVE METABOLIC PANEL WITH GFR
ALT: 20 U/L (ref 0–44)
AST: 30 U/L (ref 15–41)
Albumin: 4.2 g/dL (ref 3.5–5.0)
Alkaline Phosphatase: 82 U/L (ref 38–126)
Anion gap: 13 (ref 5–15)
BUN: 14 mg/dL (ref 6–20)
CO2: 20 mmol/L — ABNORMAL LOW (ref 22–32)
Calcium: 9.1 mg/dL (ref 8.9–10.3)
Chloride: 100 mmol/L (ref 98–111)
Creatinine, Ser: 0.63 mg/dL (ref 0.61–1.24)
GFR, Estimated: >60 mL/min (ref >60)
Glucose, Bld: 146 mg/dL — ABNORMAL HIGH (ref 70–99)
Potassium: 3.7 mmol/L (ref 3.5–5.1)
Sodium: 133 mmol/L — ABNORMAL LOW (ref 135–145)
Total Bilirubin: 0.5 mg/dL (ref 0.0–1.2)
Total Protein: 7.3 g/dL (ref 6.5–8.1)

## 2023-07-24 LAB — SALICYLATE LEVEL: Salicylate Lvl: <7 mg/dL — ABNORMAL LOW (ref 7.0–30.0)

## 2023-07-24 LAB — ETHANOL: Alcohol, Ethyl (B): 200 mg/dL — ABNORMAL HIGH (ref <10)

## 2023-07-24 LAB — RAPID URINE DRUG SCREEN, HOSP PERFORMED
Amphetamines: NOT DETECTED
Barbiturates: NOT DETECTED
Benzodiazepines: NOT DETECTED
Cocaine: NOT DETECTED
Opiates: NOT DETECTED
Tetrahydrocannabinol: POSITIVE — AB

## 2023-07-24 LAB — ACETAMINOPHEN LEVEL: Acetaminophen (Tylenol), Serum: <10 ug/mL — ABNORMAL LOW (ref 10–30)

## 2023-07-24 MED ORDER — LORAZEPAM 1 MG PO TABS
1.0000 mg | ORAL_TABLET | Freq: Once | ORAL | Status: AC
  Administered 2023-07-24: 1 mg via ORAL
  Filled 2023-07-24: qty 1

## 2023-07-24 NOTE — Discharge Instructions (Addendum)
 Please follow-up with the behavioral health urgent care in [place] as needed or this emergency department
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
